# Patient Record
Sex: Female | Born: 1935 | Race: White | Hispanic: No | Marital: Married | Smoking: Never smoker
Health system: Southern US, Community
[De-identification: ages and names within clinical notes are randomized; demographics above are authoritative.]

## PROBLEM LIST (undated history)

## (undated) DIAGNOSIS — I10 Essential (primary) hypertension: Secondary | ICD-10-CM

## (undated) DIAGNOSIS — C801 Malignant (primary) neoplasm, unspecified: Secondary | ICD-10-CM

## (undated) DIAGNOSIS — I639 Cerebral infarction, unspecified: Secondary | ICD-10-CM

## (undated) DIAGNOSIS — Z4659 Encounter for fitting and adjustment of other gastrointestinal appliance and device: Secondary | ICD-10-CM

## (undated) DIAGNOSIS — I2510 Atherosclerotic heart disease of native coronary artery without angina pectoris: Secondary | ICD-10-CM

## (undated) DIAGNOSIS — D649 Anemia, unspecified: Secondary | ICD-10-CM

## (undated) DIAGNOSIS — F329 Major depressive disorder, single episode, unspecified: Secondary | ICD-10-CM

## (undated) DIAGNOSIS — E119 Type 2 diabetes mellitus without complications: Secondary | ICD-10-CM

## (undated) DIAGNOSIS — F32A Depression, unspecified: Secondary | ICD-10-CM

## (undated) HISTORY — PX: EYE SURGERY: SHX253

## (undated) HISTORY — PX: COLON SURGERY: SHX602

## (undated) HISTORY — PX: CHOLECYSTECTOMY: SHX55

## (undated) HISTORY — PX: HERNIA REPAIR: SHX51

---

## 2010-04-08 ENCOUNTER — Ambulatory Visit: Payer: Self-pay | Admitting: Internal Medicine

## 2010-05-05 ENCOUNTER — Ambulatory Visit: Payer: Self-pay | Admitting: Internal Medicine

## 2010-07-28 ENCOUNTER — Ambulatory Visit: Payer: Self-pay | Admitting: Cardiology

## 2015-02-12 ENCOUNTER — Encounter: Payer: Self-pay | Admitting: *Deleted

## 2015-02-12 ENCOUNTER — Ambulatory Visit (INDEPENDENT_AMBULATORY_CARE_PROVIDER_SITE_OTHER): Payer: Medicare PPO

## 2015-02-12 ENCOUNTER — Ambulatory Visit
Admission: EM | Admit: 2015-02-12 | Discharge: 2015-02-12 | Disposition: A | Discharge status: Home / Self Care | Payer: Medicare PPO | Attending: Family Medicine | Admitting: Family Medicine

## 2015-02-12 DIAGNOSIS — S91331A Puncture wound without foreign body, right foot, initial encounter: Secondary | ICD-10-CM | POA: Diagnosis not present

## 2015-02-12 DIAGNOSIS — S9031XA Contusion of right foot, initial encounter: Secondary | ICD-10-CM | POA: Diagnosis not present

## 2015-02-12 HISTORY — DX: Malignant (primary) neoplasm, unspecified: C80.1

## 2015-02-12 HISTORY — DX: Anemia, unspecified: D64.9

## 2015-02-12 HISTORY — DX: Depression, unspecified: F32.A

## 2015-02-12 HISTORY — DX: Cerebral infarction, unspecified: I63.9

## 2015-02-12 HISTORY — DX: Atherosclerotic heart disease of native coronary artery without angina pectoris: I25.10

## 2015-02-12 HISTORY — DX: Essential (primary) hypertension: I10

## 2015-02-12 HISTORY — DX: Type 2 diabetes mellitus without complications: E11.9

## 2015-02-12 HISTORY — DX: Major depressive disorder, single episode, unspecified: F32.9

## 2015-02-12 MED ORDER — TETANUS-DIPHTH-ACELL PERTUSSIS 5-2.5-18.5 LF-MCG/0.5 IM SUSP
0.5000 mL | Freq: Once | INTRAMUSCULAR | Status: AC
  Administered 2015-02-12: 0.5 mL via INTRAMUSCULAR

## 2015-02-12 MED ORDER — CIPROFLOXACIN HCL 500 MG PO TABS: 500.0000 mg | ORAL_TABLET | Freq: Two times a day (BID) | ORAL | Status: DC

## 2015-02-12 NOTE — ED Provider Notes (Signed)
[facility]  ____________________________________________  Time seen: Approximately 3:11 PM  I have reviewed the triage vital signs and the nursing notes.   HISTORY  Chief Complaint Foot Injury  HPI Alisha Winters is a 82 y.o. female presents with family at bedside for the complete the right foot pain. Patient reports that yesterday evening she was walking outside in her bedroom slippers that had a rubber sole, and she stepped on a piece of wood that went through her vegan slipper and cut her foot. Patient denies anything staying in her foot. Patient reports that she did not fall. Denies head injury or loss of consciousness. Denies other pain or injury. Patient states that she has had continued pain at the injury site as well as to the lateral right foot since injury occurrence. States current pain is 6 out of 10 aching. Denies any redness or coloration changes. Patient reports that she has chronic diabetic neuropathy in bilateral feet but denies any acute sensation changes. Denies any numbness.  Denies head injury or loss of consciousness. Denies other extremity injury. Denies neck or back pain or injury. Denies chest pain, shortness of  breath, abdominal pain, weakness, dizziness.   Reports unsure of her last tetanus immunization. Reports that her grandson lives at her home and helps take care of her.  Past Medical History  Diagnosis Date  . Hypertension   . Diabetes mellitus without complication ([place])   . Depression   . Anemia   . Coronary artery disease   . Cancer ([place])   . Stroke [facility])     There are no active problems to display for this patient.   Past Surgical History  Procedure Laterality Date  . Eye surgery    . Colon surgery    . Hernia repair    . Cholecystectomy      Current Outpatient Rx  Name  Route  Sig  Dispense  Refill  . amLODipine (NORVASC) 10 MG tablet   Oral   Take 10 mg by mouth daily.         Marland Kitchen aspirin 81 MG tablet   Oral   Take 81  mg by mouth daily.         . insulin NPH-regular Human (NOVOLIN 70/30) (70-30) 100 UNIT/ML injection   Subcutaneous   Inject into the skin 2 (two) times daily with a meal.         . isosorbide mononitrate (IMDUR) 30 MG 24 hr tablet   Oral   Take 30 mg by mouth daily.         Marland Kitchen lisinopril (PRINIVIL,ZESTRIL) 10 MG tablet   Oral   Take 10 mg by mouth daily.         . metoprolol succinate (TOPROL-XL) 50 MG 24 hr tablet   Oral   Take 50 mg by mouth daily. Take with or immediately following a meal.         . Omega-3 Fatty Acids (FISH OIL PO)   Oral   Take 1 capsule by mouth daily.         . Probiotic Product (ALIGN) 4 MG CAPS   Oral   Take 1 capsule by mouth daily.         . psyllium (METAMUCIL) 58.6 % packet   Oral   Take 1 packet by mouth daily.         . vitamin B-12 (CYANOCOBALAMIN) 1000 MCG tablet   Oral   Take 1,000 mcg by mouth daily.  Allergies Penicillins; Atorvastatin; Citalopram; Codeine; Meloxicam; and Simvastatin  History reviewed. No pertinent family history.  Social History Social History  Substance Use Topics  . Smoking status: Never Smoker   . Smokeless tobacco: Never Used  . Alcohol Use: No    Review of Systems Constitutional: No fever/chills Eyes: No visual changes. ENT: No sore throat. Cardiovascular: Denies chest pain. Respiratory: Denies shortness of breath. Gastrointestinal: No abdominal pain.  No nausea, no vomiting.  No diarrhea.  No constipation. Genitourinary: Negative for dysuria. Musculoskeletal: Negative for back pain. right foot pain.  Skin: Negative for rash. Neurological: Negative for headaches, focal weakness or numbness.  10-point ROS otherwise negative.  ____________________________________________   PHYSICAL EXAM:  VITAL SIGNS: ED Triage Vitals  Enc Vitals Group     BP 02/12/15 1349 159/65 mmHg     Pulse Rate 02/12/15 1349 86     Resp 02/12/15 1349 18     Temp 02/12/15 1349 97.4 F  (36.3 C)     Temp Source 02/12/15 1349 Tympanic     SpO2 02/12/15 1349 97 %     Weight 02/12/15 1349 280 lb (127.007 kg)     Height 02/12/15 1349 5\' 10"  (1.778 m)     Head Cir --      Peak Flow --      Pain Score 02/12/15 1406 10     Pain Loc --      Pain Edu? --      Excl. in [place]? --     Constitutional: Alert and oriented. Well appearing and in no acute distress. Eyes: Conjunctivae are normal. PERRL. EOMI. Head: Atraumatic.  Nose: No congestion/rhinnorhea.  Mouth/Throat: Mucous membranes are moist.   Neck: No stridor.  No cervical spine tenderness to palpation. Hematological/Lymphatic/Immunilogical: No cervical lymphadenopathy. Cardiovascular: Normal rate, regular rhythm. Grossly normal heart sounds.  Good peripheral circulation. Respiratory: Normal respiratory effort.  No retractions. Lungs CTAB. Gastrointestinal: Soft and nontender. No distention. Normal Bowel sounds.  No abdominal bruits. No CVA tenderness. Musculoskeletal: No lower or upper extremity tenderness nor edema. Bilateral pedal pulses equal and easily palpated.  no cervical, thoracic or lumbar tenderness to palpation.  Except: Right lateral plantar foot abrasion present times approximately 0.50.5 cm, no penetration marks noted, mild tenderness palpation, no foreign body visualized, no erythema, no exudate or drainage, no induration, right lateral and right lateral plantar foot mild to moderate tenderness to palpation with mild swelling, right lateral foot with mild ecchymosis, full range of motion to the right foot, mild pain with right dorsiflexion and plantar flexion, cap refill less than 2 seconds to all right foot toes, sensation intact to right foot, bilateral pedal pulses equal bilaterally, right foot and right lower actually otherwise nontender.Ambulatory with mild antalgic gait.  Neurologic:  Normal speech and language. No gross focal neurologic deficits are appreciated.  Skin:  Skin is warm, dry and intact. No rash  noted. Psychiatric: Mood and affect are normal. Speech and behavior are normal.  ____________________________________________   LABS (all labs ordered are listed, but only abnormal results are displayed)  Labs Reviewed - No data to display ____________________________________________  RADIOLOGY  EXAM: RIGHT FOOT COMPLETE - 3+ VIEW  COMPARISON: None.  FINDINGS: There is no evidence of fracture or dislocation. There is no evidence of other focal bone abnormality. There is palmar midfoot soft tissue swelling. Vascular calcifications are seen. No radiopaque foreign body are noted.  IMPRESSION: No acute fracture or dislocation identified about the right foot.  No evidence of radiopaque foreign  bodies.   Electronically Signed By: Fidela Salisbury M.D. On: 02/12/2015 14:53  I, Marylene Land, personally viewed and evaluated these images (plain radiographs) as part of my medical decision making, as well as reviewing the written report by the radiologist.  ____________________________________________   PROCEDURES  Procedure(s) performed:  Right postoperative shoe applied by RN. Neurovascular intact post application.  Right foot wound cleaned and irrigated with betadine and saline, no repair indicated. No foreign bodies visualized.  ____________________________________________   INITIAL IMPRESSION / ASSESSMENT AND PLAN / ED COURSE  Pertinent labs & imaging results that were available during my care of the patient were reviewed by me and considered in my medical decision making (see chart for details).  Very well-appearing patient. No acute distress. Presents with family at bedside. Patient reports that yesterday evening she was walking outside her bedroom slippers and stepped on a piece of wood that cut her foot through her slip her. Denies fall or other injury. Reports pain since to her right foot. Right plantar foot with tenderness and superficial abrasion  present, no foreign bodies palpated or visualized. Will evaluate x-ray.  Right foot x-ray no acute fracture or dislocation identified about the right foot, no evidence of radiopaque foreign bodies, per radiologist. Soaked and cleaned foot with betadine. Topical antibiotic and dressing applied. Tetanus immunization updated. States she believes she has a pronged walker at home, prescription given for walker in case she doesn't have one. As report that injury was through rubber shoe, will place on cipro orally. Encouraged rest, ice elevation, cleaning and topical antibiotics. Encouraged close wound monitoring, and discussed this with patient, grandson and daughter.   Discussed follow up with Primary care physician this week. Discussed follow up and return parameters including no resolution or any worsening concerns. Patient verbalized understanding and agreed to plan.   ____________________________________________   FINAL CLINICAL IMPRESSION(S) / ED DIAGNOSES  Final diagnoses:  Foot contusion, right, initial encounter  Puncture wound of plantar aspect of foot without complication, right, initial encounter      Note: This dictation was prepared with Dragon dictation along with smaller phrase technology. Any transcriptional errors that result from this process are unintentional.    Marylene Land, NP 02/12/15 [phone]

## 2015-02-12 NOTE — Discharge Instructions (Signed)
Take medication as prescribed. Keep clean. Apply topical antibiotic. Use walker and post operative shoe as long as pain continues, elevate foot. Monitor wound closely.   Follow up with your primary care physician this week. Return to Urgent care or ER for redness, swelling, increased pain, drainage, new or worsening concerns.    Foot Contusion A foot contusion is a deep bruise to the foot. Contusions are the result of an injury that caused bleeding under the skin. The contusion may turn blue, purple, or yellow. Minor injuries will give you a painless contusion, but more severe contusions may stay painful and swollen for a few weeks. CAUSES  A foot contusion comes from a direct blow to that area, such as a heavy object falling on the foot. SYMPTOMS   Swelling of the foot.  Discoloration of the foot.  Tenderness or soreness of the foot. DIAGNOSIS  You will have a physical exam and will be asked about your history. You may need an X-ray of your foot to look for a broken bone (fracture).  TREATMENT  An elastic wrap may be recommended to support your foot. Resting, elevating, and applying cold compresses to your foot are often the best treatments for a foot contusion. Over-the-counter medicines may also be recommended for pain control. HOME CARE INSTRUCTIONS   Put ice on the injured area.  Put ice in a plastic bag.  Place a towel between your skin and the bag.  Leave the ice on for 15-20 minutes, 03-04 times a day.  Only take over-the-counter or prescription medicines for pain, discomfort, or fever as directed by your caregiver.  If told, use an elastic wrap as directed. This can help reduce swelling. You may remove the wrap for sleeping, showering, and bathing. If your toes become numb, cold, or blue, take the wrap off and reapply it more loosely.  Elevate your foot with pillows to reduce swelling.  Try to avoid standing or walking while the foot is painful. Do not resume use until  instructed by your caregiver. Then, begin use gradually. If pain develops, decrease use. Gradually increase activities that do not cause discomfort until you have normal use of your foot.  See your caregiver as directed. It is very important to keep all follow-up appointments in order to avoid any lasting problems with your foot, including long-term (chronic) pain. SEEK IMMEDIATE MEDICAL CARE IF:   You have increased redness, swelling, or pain in your foot.  Your swelling or pain is not relieved with medicines.  You have loss of feeling in your foot or are unable to move your toes.  Your foot turns cold or blue.  You have pain when you move your toes.  Your foot becomes warm to the touch.  Your contusion does not improve in 2 days. MAKE SURE YOU:   Understand these instructions.  Will watch your condition.  Will get help right away if you are not doing well or get worse.   This information is not intended to replace advice given to you by your health care provider. Make sure you discuss any questions you have with your health care provider.   Document Released: 10/12/2005 Document Revised: 06/22/2011 Document Reviewed: 08/27/2014 Elsevier Interactive Patient Education 2016 [employer]  Puncture Wound A puncture wound is an injury that is caused by a sharp, thin object that goes through (penetrates) your skin. Usually, a puncture wound does not leave a large opening in your skin, so it may not bleed a lot. However,  when you get a puncture wound, dirt or other materials (foreign bodies) can be forced into your wound and break off inside. This increases the chance of infection, such as tetanus. CAUSES Puncture wounds are caused by any sharp, thin object that goes through your skin, such as:  Animal teeth, as with an animal bite.  Sharp, pointed objects, such as nails, splinters of glass, fishhooks, and needles. SYMPTOMS Symptoms of a puncture wound  include:  Pain.  Bleeding.  Swelling.  Bruising.  Fluid leaking from the wound.  Numbness, tingling, or loss of function. DIAGNOSIS This condition is diagnosed with a medical history and physical exam. Your wound will be checked to see if it contains any foreign bodies. You may also have X-rays or other imaging tests. TREATMENT Treatment for a puncture wound depends on how serious the wound is. It also depends on whether the wound contains any foreign bodies. Treatment for all types of puncture wounds usually starts with:  Controlling the bleeding.  Washing out the wound with a germ-free (sterile) salt-water solution.  Checking the wound for foreign bodies. Treatment may also include:  Having the wound opened surgically to remove a foreign object.  Closing the wound with stitches (sutures) if it continues to bleed.  Covering the wound with antibiotic ointments and a bandage (dressing).  Receiving a tetanus shot.  Receiving a rabies vaccine. HOME CARE INSTRUCTIONS Medicines  Take or apply over-the-counter and prescription medicines only as told by your health care provider.  If you were prescribed an antibiotic, take or apply it as told by your health care provider. Do not stop using the antibiotic even if your condition improves. Wound Care  There are many ways to close and cover a wound. For example, a wound can be covered with sutures, skin glue, or adhesive strips. Follow instructions from your health care provider about:  How to take care of your wound.  When and how you should change your dressing.  When you should remove your dressing.  Removing whatever was used to close your wound.  Keep the dressing dry as told by your health care provider. Do not take baths, swim, use a hot tub, or do anything that would put your wound underwater until your health care provider approves.  Clean the wound as told by your health care provider.  Do not scratch or pick  at the wound.  Check your wound every day for signs of infection. Watch for:  Redness, swelling, or pain.  Fluid, blood, or pus. General Instructions  Raise (elevate) the injured area above the level of your heart while you are sitting or lying down.  If your puncture wound is in your foot, ask your health care provider if you need to avoid putting weight on your foot and for how long.  Keep all follow-up visits as told by your health care provider. This is important. SEEK MEDICAL CARE IF:  You received a tetanus shot and you have swelling, severe pain, redness, or bleeding at the injection site.  You have a fever.  Your sutures come out.  You notice a bad smell coming from your wound or your dressing.  You notice something coming out of your wound, such as wood or glass.  Your pain is not controlled with medicine.  You have increased redness, swelling, or pain at the site of your wound.  You have fluid, blood, or pus coming from your wound.  You notice a change in the color of your skin  near your wound.  You need to change the dressing frequently due to fluid, blood, or pus draining from your wound.  You develop a new rash.  You develop numbness around your wound. SEEK IMMEDIATE MEDICAL CARE IF:  You develop severe swelling around your wound.  Your pain suddenly increases and is severe.  You develop painful skin lumps.  You have a red streak going away from your wound.  The wound is on your hand or foot and you cannot properly move a finger or toe.  The wound is on your hand or foot and you notice that your fingers or toes look pale or bluish.   This information is not intended to replace advice given to you by your health care provider. Make sure you discuss any questions you have with your health care provider.   Document Released: 09/30/2004 Document Revised: 09/11/2014 Document Reviewed: 02/13/2014 Elsevier Interactive Patient Education [employer].

## 2015-02-12 NOTE — ED Notes (Signed)
Patient was working in yard when she stepped on something sharp which went through her right show and cut her foot.

## 2019-04-12 ENCOUNTER — Ambulatory Visit: Payer: Medicare Other | Attending: Internal Medicine

## 2019-04-12 DIAGNOSIS — Z23 Encounter for immunization: Secondary | ICD-10-CM

## 2019-04-12 NOTE — Progress Notes (Signed)
   Covid-19 Vaccination Clinic  Name:  Alisha Winters    MRN: [id number] DOB: October 09, 1932  04/12/2019  [name] was observed post Covid-19 immunization for 15 minutes without incident. She was provided with Vaccine Information Sheet and instruction to access the V-Safe system.   [name] was instructed to call 911 with any severe reactions post vaccine: Marland Kitchen Difficulty breathing  . Swelling of face and throat  . A fast heartbeat  . A bad rash all over body  . Dizziness and weakness   Immunizations Administered    Name Date Dose VIS Date Route   Pfizer COVID-19 Vaccine 04/12/2019 10:57 AM 0.3 mL 12/15/2018 Intramuscular   Manufacturer: [place]   Lot: [id number]   [place]: [id number]

## 2019-05-08 ENCOUNTER — Ambulatory Visit: Payer: Medicare Other | Attending: Internal Medicine

## 2019-05-08 DIAGNOSIS — Z23 Encounter for immunization: Secondary | ICD-10-CM

## 2019-05-08 NOTE — Progress Notes (Signed)
   Covid-19 Vaccination Clinic  Name:  Alisha Winters    MRN: [id number] DOB: 10-06-1932  05/08/2019  [name] was observed post Covid-19 immunization for 15 minutes without incident. She was provided with Vaccine Information Sheet and instruction to access the V-Safe system.   [name] was instructed to call 911 with any severe reactions post vaccine: Marland Kitchen Difficulty breathing  . Swelling of face and throat  . A fast heartbeat  . A bad rash all over body  . Dizziness and weakness   Immunizations Administered    Name Date Dose VIS Date Route   Pfizer COVID-19 Vaccine 05/08/2019 11:06 AM 0.3 mL 02/28/2018 Intramuscular   Manufacturer: [place]   Lot: [id number]   [place]: [id number]

## 2019-07-14 ENCOUNTER — Other Ambulatory Visit: Payer: Self-pay

## 2019-07-14 ENCOUNTER — Emergency Department: Payer: Medicare Other

## 2019-07-14 ENCOUNTER — Inpatient Hospital Stay
Admission: EM | Admit: 2019-07-14 | Discharge: 2019-07-19 | DRG: 482 | Disposition: A | Discharge status: Skilled Nursing Facility | Payer: Medicare Other | Attending: Internal Medicine | Admitting: Internal Medicine

## 2019-07-14 DIAGNOSIS — Z8673 Personal history of transient ischemic attack (TIA), and cerebral infarction without residual deficits: Secondary | ICD-10-CM

## 2019-07-14 DIAGNOSIS — Z888 Allergy status to other drugs, medicaments and biological substances status: Secondary | ICD-10-CM

## 2019-07-14 DIAGNOSIS — Z7901 Long term (current) use of anticoagulants: Secondary | ICD-10-CM

## 2019-07-14 DIAGNOSIS — R42 Dizziness and giddiness: Secondary | ICD-10-CM

## 2019-07-14 DIAGNOSIS — Z79899 Other long term (current) drug therapy: Secondary | ICD-10-CM

## 2019-07-14 DIAGNOSIS — E119 Type 2 diabetes mellitus without complications: Secondary | ICD-10-CM | POA: Diagnosis present

## 2019-07-14 DIAGNOSIS — S72001A Fracture of unspecified part of neck of right femur, initial encounter for closed fracture: Secondary | ICD-10-CM

## 2019-07-14 DIAGNOSIS — E785 Hyperlipidemia, unspecified: Secondary | ICD-10-CM | POA: Diagnosis present

## 2019-07-14 DIAGNOSIS — Z7982 Long term (current) use of aspirin: Secondary | ICD-10-CM

## 2019-07-14 DIAGNOSIS — I2510 Atherosclerotic heart disease of native coronary artery without angina pectoris: Secondary | ICD-10-CM | POA: Diagnosis present

## 2019-07-14 DIAGNOSIS — Z881 Allergy status to other antibiotic agents status: Secondary | ICD-10-CM

## 2019-07-14 DIAGNOSIS — S72141A Displaced intertrochanteric fracture of right femur, initial encounter for closed fracture: Secondary | ICD-10-CM | POA: Diagnosis present

## 2019-07-14 DIAGNOSIS — M80051A Age-related osteoporosis with current pathological fracture, right femur, initial encounter for fracture: Principal | ICD-10-CM | POA: Diagnosis present

## 2019-07-14 DIAGNOSIS — Z885 Allergy status to narcotic agent status: Secondary | ICD-10-CM

## 2019-07-14 DIAGNOSIS — I480 Paroxysmal atrial fibrillation: Secondary | ICD-10-CM | POA: Diagnosis present

## 2019-07-14 DIAGNOSIS — Z20822 Contact with and (suspected) exposure to covid-19: Secondary | ICD-10-CM | POA: Diagnosis present

## 2019-07-14 DIAGNOSIS — W1830XA Fall on same level, unspecified, initial encounter: Secondary | ICD-10-CM | POA: Diagnosis present

## 2019-07-14 DIAGNOSIS — Z880 Allergy status to penicillin: Secondary | ICD-10-CM

## 2019-07-14 DIAGNOSIS — Z794 Long term (current) use of insulin: Secondary | ICD-10-CM

## 2019-07-14 DIAGNOSIS — I10 Essential (primary) hypertension: Secondary | ICD-10-CM | POA: Diagnosis present

## 2019-07-14 DIAGNOSIS — Z09 Encounter for follow-up examination after completed treatment for conditions other than malignant neoplasm: Secondary | ICD-10-CM

## 2019-07-14 LAB — BASIC METABOLIC PANEL
Anion gap: 9 (ref 5–15)
BUN: 22 mg/dL (ref 8–23)
CO2: 24 mmol/L (ref 22–32)
Calcium: 8.3 mg/dL — ABNORMAL LOW (ref 8.9–10.3)
Chloride: 102 mmol/L (ref 98–111)
Creatinine, Ser: 0.89 mg/dL (ref 0.44–1.00)
GFR calc Af Amer: >60 mL/min (ref >60)
GFR calc non Af Amer: 59 mL/min — ABNORMAL LOW (ref >60)
Glucose, Bld: 258 mg/dL — ABNORMAL HIGH (ref 70–99)
Potassium: 4 mmol/L (ref 3.5–5.1)
Sodium: 135 mmol/L (ref 135–145)

## 2019-07-14 LAB — CBC WITH DIFFERENTIAL/PLATELET
Abs Immature Granulocytes: 0.07 10*3/uL (ref 0.00–0.07)
Basophils Absolute: 0.1 10*3/uL (ref 0.0–0.1)
Basophils Relative: 1 %
Eosinophils Absolute: 0.1 10*3/uL (ref 0.0–0.5)
Eosinophils Relative: 1 %
HCT: 39.9 % (ref 36.0–46.0)
Hemoglobin: 12.9 g/dL (ref 12.0–15.0)
Immature Granulocytes: 1 %
Lymphocytes Relative: 13 %
Lymphs Abs: 1.2 10*3/uL (ref 0.7–4.0)
MCH: 26.7 pg (ref 26.0–34.0)
MCHC: 32.3 g/dL (ref 30.0–36.0)
MCV: 82.4 fL (ref 80.0–100.0)
Monocytes Absolute: 0.6 10*3/uL (ref 0.1–1.0)
Monocytes Relative: 6 %
Neutro Abs: 7.3 10*3/uL (ref 1.7–7.7)
Neutrophils Relative %: 78 %
Platelets: 252 10*3/uL (ref 150–400)
RBC: 4.84 MIL/uL (ref 3.87–5.11)
RDW: 16 % — ABNORMAL HIGH (ref 11.5–15.5)
WBC: 9.4 10*3/uL (ref 4.0–10.5)
nRBC: 0 % (ref 0.0–0.2)

## 2019-07-14 LAB — PROTIME-INR
INR: 2.4 — ABNORMAL HIGH (ref 0.8–1.2)
Prothrombin Time: 25.5 seconds — ABNORMAL HIGH (ref 11.4–15.2)

## 2019-07-14 LAB — TROPONIN I (HIGH SENSITIVITY): Troponin I (High Sensitivity): 11 ng/L (ref <18)

## 2019-07-14 MED ORDER — VITAMIN K1 10 MG/ML IJ SOLN
10.0000 mg | Freq: Once | INTRAVENOUS | Status: AC
  Administered 2019-07-15: 10 mg via INTRAVENOUS
  Filled 2019-07-14: qty 1

## 2019-07-14 MED ORDER — CEFAZOLIN SODIUM-DEXTROSE 2-4 GM/100ML-% IV SOLN
2.0000 g | INTRAVENOUS | Status: AC
Start: 2019-07-15 — End: 2019-07-16
  Filled 2019-07-14: qty 100

## 2019-07-14 MED ORDER — CHLORHEXIDINE GLUCONATE 4 % EX LIQD: 1.0000 "application " | Freq: Once | CUTANEOUS | Status: DC

## 2019-07-14 MED ORDER — CLINDAMYCIN PHOSPHATE 600 MG/50ML IV SOLN
600.0000 mg | INTRAVENOUS | Status: AC
Start: 2019-07-15 — End: 2019-07-16
  Filled 2019-07-14: qty 50

## 2019-07-14 MED ORDER — ONDANSETRON HCL 4 MG/2ML IJ SOLN
4.0000 mg | Freq: Once | INTRAMUSCULAR | Status: AC
Start: 2019-07-14 — End: 2019-07-14

## 2019-07-14 MED ORDER — MORPHINE SULFATE (PF) 2 MG/ML IV SOLN
1.0000 mg | INTRAVENOUS | Status: DC | PRN
  Administered 2019-07-15 (×2): 1 mg via INTRAVENOUS
  Filled 2019-07-14: qty 1

## 2019-07-14 MED ORDER — SODIUM CHLORIDE 0.9 % IV BOLUS
500.0000 mL | Freq: Once | INTRAVENOUS | Status: AC
  Administered 2019-07-14: 500 mL via INTRAVENOUS

## 2019-07-14 MED ORDER — ONDANSETRON HCL 4 MG/2ML IJ SOLN
INTRAMUSCULAR | Status: AC
  Administered 2019-07-14: 4 mg via INTRAVENOUS
  Filled 2019-07-14: qty 2

## 2019-07-14 MED ORDER — SODIUM CHLORIDE 0.9 % IV SOLN: INTRAVENOUS | Status: DC

## 2019-07-14 MED ORDER — MORPHINE SULFATE (PF) 4 MG/ML IV SOLN
4.0000 mg | Freq: Once | INTRAVENOUS | Status: AC
  Administered 2019-07-14: 4 mg via INTRAVENOUS
  Filled 2019-07-14: qty 1

## 2019-07-14 NOTE — ED Provider Notes (Signed)
[facility] Emergency Department Provider Note  ____________________________________________  Time seen: Approximately 10:57 PM  I have reviewed the triage vital signs and the nursing notes.   HISTORY  Chief Complaint Fall    HPI CALIYAH SIEH is a 86 y.o. female with a history of hypertension diabetes CAD and vertigo who comes ED complaining of right hip pain after a fall precipitated by vertigo today.  Reportedly she also hit her head but denies loss of consciousness or neck pain.  She is on Coumadin.  Pain in the right hip was sudden onset, severe, nonradiating, worse with movement.  Unable to stand or bear weight.  Denies vision changes paresthesias or lateralizing weakness.      Past Medical History:  Diagnosis Date  . Anemia   . Cancer ([place])   . Coronary artery disease   . Depression   . Diabetes mellitus without complication ([place])   . Hypertension   . Stroke [facility])      There are no problems to display for this patient.    Past Surgical History:  Procedure Laterality Date  . CHOLECYSTECTOMY    . COLON SURGERY    . EYE SURGERY    . HERNIA REPAIR       Prior to Admission medications   Medication Sig Start Date End Date Taking? Authorizing Provider  amLODipine (NORVASC) 10 MG tablet Take 10 mg by mouth daily.    [provider]  aspirin 81 MG tablet Take 81 mg by mouth daily.    [provider]  ciprofloxacin (CIPRO) 500 MG tablet Take 1 tablet (500 mg total) by mouth 2 (two) times daily. 02/12/15   Marylene Land, NP  insulin NPH-regular Human (NOVOLIN 70/30) (70-30) 100 UNIT/ML injection Inject into the skin 2 (two) times daily with a meal.    [provider]  isosorbide mononitrate (IMDUR) 30 MG 24 hr tablet Take 30 mg by mouth daily.    [provider]  lisinopril (PRINIVIL,ZESTRIL) 10 MG tablet Take 10 mg by mouth daily.    [provider]  metoprolol succinate (TOPROL-XL) 50 MG 24 hr  tablet Take 50 mg by mouth daily. Take with or immediately following a meal.    [provider]  Omega-3 Fatty Acids (FISH OIL PO) Take 1 capsule by mouth daily.    [provider]  Probiotic Product (ALIGN) 4 MG CAPS Take 1 capsule by mouth daily.    [provider]  psyllium (METAMUCIL) 58.6 % packet Take 1 packet by mouth daily.    [provider]  vitamin B-12 (CYANOCOBALAMIN) 1000 MCG tablet Take 1,000 mcg by mouth daily.    [provider]     Allergies Penicillins, Atorvastatin, Citalopram, Codeine, Meloxicam, and Simvastatin   No family history on file.  Social History Social History   Tobacco Use  . Smoking status: Never Smoker  . Smokeless tobacco: Never Used  Substance Use Topics  . Alcohol use: No  . Drug use: No    Review of Systems  Constitutional:   No fever or chills.  ENT:   No sore throat. No rhinorrhea. Cardiovascular:   No chest pain or syncope. Respiratory:   No dyspnea or cough. Gastrointestinal:   Negative for abdominal pain, vomiting and diarrhea.  Musculoskeletal:   Right hip pain as above All other systems reviewed and are negative except as documented above in ROS and HPI.  ____________________________________________   PHYSICAL EXAM:  VITAL SIGNS: ED Triage Vitals  Enc Vitals Group     BP 07/14/19 2221 (!) 173/74     Pulse Rate 07/14/19 2221 71     Resp 07/14/19 2221 19     Temp 07/14/19 2221 98.1 F (36.7 C)     Temp Source 07/14/19 2221 Oral     SpO2 07/14/19 2221 96 %     Weight 07/14/19 2219 260 lb (117.9 kg)     Height 07/14/19 2219 6' (1.829 m)     Head Circumference --      Peak Flow --      Pain Score 07/14/19 2219 4     Pain Loc --      Pain Edu? --      Excl. in [place]? --     Vital signs reviewed, nursing assessments reviewed.   Constitutional:   Alert and oriented. Non-toxic appearance. Eyes:   Conjunctivae are normal. EOMI. PERRL. ENT      Head:   Normocephalic and  atraumatic.      Nose: Normal.      Mouth/Throat: No intraoral injury.      Neck:   No meningismus. Full ROM.  No midline tenderness. Hematological/Lymphatic/Immunilogical:   No cervical lymphadenopathy. Cardiovascular:   RRR. Symmetric bilateral radial and DP pulses.  No murmurs. Cap refill less than 2 seconds. Respiratory:   Normal respiratory effort without tachypnea/retractions. Breath sounds are clear and equal bilaterally. No wheezes/rales/rhonchi. Gastrointestinal:   Soft and nontender. Non distended. There is no CVA tenderness.  No rebound, rigidity, or guarding.  Musculoskeletal:   Tenderness at the right hip with limb shortening and external rotation.  Distal femur  Neurologic:   Normal speech and language.  Motor grossly intact. No acute focal neurologic deficits are appreciated.  Skin:    Skin is warm, dry and intact. No rash noted.  No petechiae, purpura, or bullae.  ____________________________________________    LABS (pertinent positives/negatives) (all labs ordered are listed, but only abnormal results are displayed) Labs Reviewed  SARS CORONAVIRUS 2 BY RT PCR ([facility])  BASIC METABOLIC PANEL  CBC WITH DIFFERENTIAL/PLATELET  PROTIME-INR  TROPONIN I (HIGH SENSITIVITY)   ____________________________________________   EKG  Interpreted by me Atrial fibrillation rate of 68, normal axis and intervals.  Normal QRS ST segments and T waves.  ____________________________________________    RADIOLOGY  DG Chest 1 View  Result Date: 07/14/2019 CLINICAL DATA:  Fall with right hip pain EXAM: CHEST  1 VIEW COMPARISON:  None. FINDINGS: Right hemidiaphragm is elevated. There is bibasilar atelectasis. No focal consolidation pulmonary edema. Normal cardiomediastinal contours. No pleural effusion. IMPRESSION: Right hemidiaphragm elevation with bibasilar atelectasis. Electronically Signed   By: Ulyses Jarred M.D.   On: 07/14/2019  22:51   CT HEAD WO CONTRAST  Result Date: 07/14/2019 CLINICAL DATA:  Fall EXAM: CT HEAD WITHOUT CONTRAST TECHNIQUE: Contiguous axial images were obtained from the base of the skull through the vertex without intravenous contrast. COMPARISON:  None. FINDINGS: Brain: There is no mass, hemorrhage or extra-axial collection. The size and configuration of the ventricles and extra-axial CSF spaces are normal. The brain parenchyma is normal, without acute or chronic infarction. Vascular: No abnormal hyperdensity of the major intracranial arteries or dural venous sinuses. No intracranial atherosclerosis. Skull: The visualized skull base, calvarium and extracranial soft tissues are normal. Sinuses/Orbits: No fluid levels or advanced mucosal thickening of the visualized paranasal sinuses. No mastoid or middle ear effusion. The orbits are normal. IMPRESSION: Normal head CT. Electronically Signed  By: Ulyses Jarred M.D.   On: 07/14/2019 22:56   DG Hip Unilat W or Wo Pelvis 2-3 Views Right  Result Date: 07/14/2019 CLINICAL DATA:  [place], right hip pain EXAM: DG HIP (WITH OR WITHOUT PELVIS) 2-3V RIGHT COMPARISON:  None. FINDINGS: Frontal view of the pelvis as well as frontal and cross-table lateral views of the right hip are obtained. There is an impacted basicervical right hip fracture with varus angulation. No dislocation. The remainder of the bony pelvis is unremarkable. Sacroiliac joints are normal. IMPRESSION: 1. Impacted basicervical right hip fracture with varus angulation. Electronically Signed   By: Randa Ngo M.D.   On: 07/14/2019 22:52    ____________________________________________   PROCEDURES Procedures  ____________________________________________  DIFFERENTIAL DIAGNOSIS   Right hip fracture, intracranial hemorrhage, dehydration, electrolyte abnormality  CLINICAL IMPRESSION / ASSESSMENT AND PLAN / ED COURSE  Medications ordered in the ED: Medications  sodium chloride 0.9 % bolus 500 mL  (has no administration in time range)  morphine 4 MG/ML injection 4 mg (has no administration in time range)  ondansetron (ZOFRAN) injection 4 mg (has no administration in time range)    Pertinent labs & imaging results that were available during my care of the patient were reviewed by me and considered in my medical decision making (see chart for details).  WAUNETTA RIGGLE was evaluated in Emergency Department on 07/14/2019 for the symptoms described in the history of present illness. She was evaluated in the context of the global COVID-19 pandemic, which necessitated consideration that the patient might be at risk for infection with the SARS-CoV-2 virus that causes COVID-19. Institutional protocols and algorithms that pertain to the evaluation of patients at risk for COVID-19 are in a state of rapid change based on information released by regulatory bodies including the CDC and federal and state organizations. These policies and algorithms were followed during the patient's care in the ED.   Patient presents with right hip pain after a fall, clinically apparent hip fracture.  Will obtain x-ray of right hip and chest.  CT head to evaluate for possible intracranial hemorrhage due to anticoagulation and head trauma.  Check labs, Covid screening.  ----------------------------------------- 11:06 PM on 07/14/2019 -----------------------------------------  X-ray confirms right femoral neck/intertrochanteric fracture.  Discussed with Dr. Sabra Heck who will plan surgical repair depending on anticoagulation status when labs are available.  Will discuss with hospitalist.      ____________________________________________   FINAL CLINICAL IMPRESSION(S) / ED DIAGNOSES    Final diagnoses:  Closed right hip fracture, initial encounter ([place])  Vertigo  Type 2 diabetes mellitus without complication, with long-term current use of insulin [facility])     ED Discharge Orders    None      Portions of this  note were generated with dragon dictation software. Dictation errors may occur despite best attempts at proofreading.   Carrie Mew, MD 07/14/19 2306

## 2019-07-14 NOTE — ED Triage Notes (Signed)
Pt from home via  [facility] w cc of fall. Pt has hx of vertigo, was trying ot go to bathroom and fell. Denies LOC, pt states "they told me I hit my head". Reports R hip pain and obvious R leg shortening. 20G L wrist, pt given 132mcg fentanyl total by EMS Pt 91% room air, placed on 3L [place] and improved to 96'%

## 2019-07-15 ENCOUNTER — Inpatient Hospital Stay
Admit: 2019-07-15 | Discharge: 2019-07-15 | Disposition: A | Discharge status: Home / Self Care | Payer: Medicare Other | Attending: Family Medicine | Admitting: Family Medicine

## 2019-07-15 DIAGNOSIS — Z7982 Long term (current) use of aspirin: Secondary | ICD-10-CM | POA: Diagnosis not present

## 2019-07-15 DIAGNOSIS — I4891 Unspecified atrial fibrillation: Secondary | ICD-10-CM | POA: Diagnosis not present

## 2019-07-15 DIAGNOSIS — Z880 Allergy status to penicillin: Secondary | ICD-10-CM | POA: Diagnosis not present

## 2019-07-15 DIAGNOSIS — Z8673 Personal history of transient ischemic attack (TIA), and cerebral infarction without residual deficits: Secondary | ICD-10-CM | POA: Diagnosis not present

## 2019-07-15 DIAGNOSIS — I10 Essential (primary) hypertension: Secondary | ICD-10-CM | POA: Diagnosis present

## 2019-07-15 DIAGNOSIS — Z79899 Other long term (current) drug therapy: Secondary | ICD-10-CM | POA: Diagnosis not present

## 2019-07-15 DIAGNOSIS — E119 Type 2 diabetes mellitus without complications: Secondary | ICD-10-CM | POA: Diagnosis present

## 2019-07-15 DIAGNOSIS — E118 Type 2 diabetes mellitus with unspecified complications: Secondary | ICD-10-CM | POA: Diagnosis not present

## 2019-07-15 DIAGNOSIS — E785 Hyperlipidemia, unspecified: Secondary | ICD-10-CM | POA: Diagnosis present

## 2019-07-15 DIAGNOSIS — Z794 Long term (current) use of insulin: Secondary | ICD-10-CM | POA: Diagnosis not present

## 2019-07-15 DIAGNOSIS — R42 Dizziness and giddiness: Secondary | ICD-10-CM | POA: Diagnosis present

## 2019-07-15 DIAGNOSIS — M80051A Age-related osteoporosis with current pathological fracture, right femur, initial encounter for fracture: Secondary | ICD-10-CM | POA: Diagnosis present

## 2019-07-15 DIAGNOSIS — I2510 Atherosclerotic heart disease of native coronary artery without angina pectoris: Secondary | ICD-10-CM | POA: Diagnosis present

## 2019-07-15 DIAGNOSIS — Z20822 Contact with and (suspected) exposure to covid-19: Secondary | ICD-10-CM | POA: Diagnosis present

## 2019-07-15 DIAGNOSIS — Z881 Allergy status to other antibiotic agents status: Secondary | ICD-10-CM | POA: Diagnosis not present

## 2019-07-15 DIAGNOSIS — S72141A Displaced intertrochanteric fracture of right femur, initial encounter for closed fracture: Secondary | ICD-10-CM | POA: Diagnosis not present

## 2019-07-15 DIAGNOSIS — W1830XA Fall on same level, unspecified, initial encounter: Secondary | ICD-10-CM | POA: Diagnosis present

## 2019-07-15 DIAGNOSIS — I480 Paroxysmal atrial fibrillation: Secondary | ICD-10-CM | POA: Diagnosis present

## 2019-07-15 DIAGNOSIS — Z7901 Long term (current) use of anticoagulants: Secondary | ICD-10-CM | POA: Diagnosis not present

## 2019-07-15 DIAGNOSIS — Z885 Allergy status to narcotic agent status: Secondary | ICD-10-CM | POA: Diagnosis not present

## 2019-07-15 DIAGNOSIS — Z888 Allergy status to other drugs, medicaments and biological substances status: Secondary | ICD-10-CM | POA: Diagnosis not present

## 2019-07-15 DIAGNOSIS — S72001A Fracture of unspecified part of neck of right femur, initial encounter for closed fracture: Secondary | ICD-10-CM | POA: Diagnosis not present

## 2019-07-15 LAB — URINALYSIS, ROUTINE W REFLEX MICROSCOPIC
Bilirubin Urine: NEGATIVE
Glucose, UA: >=500 mg/dL — AB
Hgb urine dipstick: NEGATIVE
Ketones, ur: NEGATIVE mg/dL
Leukocytes,Ua: NEGATIVE
Nitrite: NEGATIVE
Protein, ur: 100 mg/dL — AB
Specific Gravity, Urine: 1.02 (ref 1.005–1.030)
Squamous Epithelial / HPF: NONE SEEN (ref 0–5)
pH: 5 (ref 5.0–8.0)

## 2019-07-15 LAB — ECHOCARDIOGRAM COMPLETE
Height: 72 in
Weight: 4160 oz

## 2019-07-15 LAB — GLUCOSE, CAPILLARY
Glucose-Capillary: 266 mg/dL — ABNORMAL HIGH (ref 70–99)
Glucose-Capillary: 239 mg/dL — ABNORMAL HIGH (ref 70–99)
Glucose-Capillary: 213 mg/dL — ABNORMAL HIGH (ref 70–99)
Glucose-Capillary: 163 mg/dL — ABNORMAL HIGH (ref 70–99)
Glucose-Capillary: 155 mg/dL — ABNORMAL HIGH (ref 70–99)

## 2019-07-15 LAB — CBC
HCT: 40.9 % (ref 36.0–46.0)
HCT: 41.1 % (ref 36.0–46.0)
Hemoglobin: 13.2 g/dL (ref 12.0–15.0)
Hemoglobin: 13.6 g/dL (ref 12.0–15.0)
MCH: 26.7 pg (ref 26.0–34.0)
MCH: 26.9 pg (ref 26.0–34.0)
MCHC: 32.3 g/dL (ref 30.0–36.0)
MCHC: 33.1 g/dL (ref 30.0–36.0)
MCV: 82.6 fL (ref 80.0–100.0)
MCV: 81.4 fL (ref 80.0–100.0)
Platelets: 257 10*3/uL (ref 150–400)
Platelets: 261 10*3/uL (ref 150–400)
RBC: 4.95 MIL/uL (ref 3.87–5.11)
RBC: 5.05 MIL/uL (ref 3.87–5.11)
RDW: 15.9 % — ABNORMAL HIGH (ref 11.5–15.5)
RDW: 16.1 % — ABNORMAL HIGH (ref 11.5–15.5)
WBC: 13.3 10*3/uL — ABNORMAL HIGH (ref 4.0–10.5)
WBC: 13.4 10*3/uL — ABNORMAL HIGH (ref 4.0–10.5)
nRBC: 0 % (ref 0.0–0.2)
nRBC: 0 % (ref 0.0–0.2)

## 2019-07-15 LAB — PROTIME-INR
INR: 2 — ABNORMAL HIGH (ref 0.8–1.2)
INR: 1.7 — ABNORMAL HIGH (ref 0.8–1.2)
Prothrombin Time: 21.8 seconds — ABNORMAL HIGH (ref 11.4–15.2)
Prothrombin Time: 19.2 seconds — ABNORMAL HIGH (ref 11.4–15.2)

## 2019-07-15 LAB — BASIC METABOLIC PANEL
Anion gap: 7 (ref 5–15)
BUN: 19 mg/dL (ref 8–23)
CO2: 26 mmol/L (ref 22–32)
Calcium: 8.4 mg/dL — ABNORMAL LOW (ref 8.9–10.3)
Chloride: 103 mmol/L (ref 98–111)
Creatinine, Ser: 0.75 mg/dL (ref 0.44–1.00)
GFR calc Af Amer: >60 mL/min (ref >60)
GFR calc non Af Amer: >60 mL/min (ref >60)
Glucose, Bld: 262 mg/dL — ABNORMAL HIGH (ref 70–99)
Potassium: 4.7 mmol/L (ref 3.5–5.1)
Sodium: 136 mmol/L (ref 135–145)

## 2019-07-15 LAB — HEMOGLOBIN A1C
Hgb A1c MFr Bld: 8.8 % — ABNORMAL HIGH (ref 4.8–5.6)
Mean Plasma Glucose: 205.86 mg/dL

## 2019-07-15 LAB — SURGICAL PCR SCREEN
MRSA, PCR: NEGATIVE
Staphylococcus aureus: NEGATIVE

## 2019-07-15 LAB — CREATININE, SERUM
Creatinine, Ser: 0.75 mg/dL (ref 0.44–1.00)
GFR calc Af Amer: >60 mL/min (ref >60)
GFR calc non Af Amer: >60 mL/min (ref >60)

## 2019-07-15 LAB — TROPONIN I (HIGH SENSITIVITY): Troponin I (High Sensitivity): 13 ng/L (ref <18)

## 2019-07-15 LAB — SARS CORONAVIRUS 2 BY RT PCR (HOSPITAL ORDER, PERFORMED IN ~~LOC~~ HOSPITAL LAB): SARS Coronavirus 2: NEGATIVE

## 2019-07-15 MED ORDER — AMLODIPINE BESYLATE 10 MG PO TABS
10.0000 mg | ORAL_TABLET | Freq: Every day | ORAL | Status: DC
  Administered 2019-07-17 – 2019-07-19 (×3): 10 mg via ORAL
  Filled 2019-07-15 (×4): qty 1

## 2019-07-15 MED ORDER — LISINOPRIL 10 MG PO TABS
10.0000 mg | ORAL_TABLET | Freq: Every day | ORAL | Status: DC
  Administered 2019-07-16 – 2019-07-19 (×4): 10 mg via ORAL
  Filled 2019-07-15 (×4): qty 1

## 2019-07-15 MED ORDER — MORPHINE SULFATE (PF) 2 MG/ML IV SOLN
1.0000 mg | INTRAVENOUS | Status: DC | PRN
  Filled 2019-07-15: qty 1

## 2019-07-15 MED ORDER — HEPARIN SODIUM (PORCINE) 5000 UNIT/ML IJ SOLN
5000.0000 [IU] | Freq: Three times a day (TID) | INTRAMUSCULAR | Status: AC
  Administered 2019-07-15 (×2): 5000 [IU] via SUBCUTANEOUS
  Filled 2019-07-15 (×2): qty 1

## 2019-07-15 MED ORDER — ISOSORBIDE MONONITRATE ER 30 MG PO TB24
30.0000 mg | ORAL_TABLET | Freq: Every day | ORAL | Status: DC
  Administered 2019-07-17 – 2019-07-19 (×3): 30 mg via ORAL
  Filled 2019-07-15 (×4): qty 1

## 2019-07-15 MED ORDER — VITAMIN K1 10 MG/ML IJ SOLN
10.0000 mg | Freq: Once | INTRAMUSCULAR | Status: AC
  Administered 2019-07-15: 10 mg via SUBCUTANEOUS
  Filled 2019-07-15: qty 1

## 2019-07-15 MED ORDER — MORPHINE SULFATE (PF) 2 MG/ML IV SOLN
2.0000 mg | INTRAVENOUS | Status: DC | PRN
  Administered 2019-07-15 – 2019-07-18 (×3): 2 mg via INTRAVENOUS
  Filled 2019-07-15 (×3): qty 1

## 2019-07-15 MED ORDER — ORAL CARE MOUTH RINSE
15.0000 mL | Freq: Two times a day (BID) | OROMUCOSAL | Status: DC
  Administered 2019-07-16 – 2019-07-19 (×4): 15 mL via OROMUCOSAL

## 2019-07-15 MED ORDER — HYDROCODONE-ACETAMINOPHEN 5-325 MG PO TABS
1.0000 | ORAL_TABLET | ORAL | Status: DC | PRN
  Administered 2019-07-15 – 2019-07-18 (×3): 1 via ORAL
  Filled 2019-07-15 (×4): qty 1

## 2019-07-15 MED ORDER — METOPROLOL SUCCINATE ER 50 MG PO TB24
50.0000 mg | ORAL_TABLET | Freq: Every day | ORAL | Status: DC
  Administered 2019-07-15 – 2019-07-19 (×5): 50 mg via ORAL
  Filled 2019-07-15 (×5): qty 1

## 2019-07-15 MED ORDER — INSULIN ASPART 100 UNIT/ML ~~LOC~~ SOLN
0.0000 [IU] | SUBCUTANEOUS | Status: DC
  Administered 2019-07-15: 5 [IU] via SUBCUTANEOUS
  Administered 2019-07-15 (×2): 3 [IU] via SUBCUTANEOUS
  Administered 2019-07-15: 5 [IU] via SUBCUTANEOUS
  Administered 2019-07-16: 11 [IU] via SUBCUTANEOUS
  Administered 2019-07-16: 3 [IU] via SUBCUTANEOUS
  Administered 2019-07-16: 15 [IU] via SUBCUTANEOUS
  Administered 2019-07-17: 5 [IU] via SUBCUTANEOUS
  Administered 2019-07-17: 8 [IU] via SUBCUTANEOUS
  Administered 2019-07-17: 3 [IU] via SUBCUTANEOUS
  Administered 2019-07-17: 5 [IU] via SUBCUTANEOUS
  Administered 2019-07-17 – 2019-07-18 (×2): 8 [IU] via SUBCUTANEOUS
  Administered 2019-07-18 (×2): 3 [IU] via SUBCUTANEOUS
  Administered 2019-07-18: 8 [IU] via SUBCUTANEOUS
  Administered 2019-07-18: 5 [IU] via SUBCUTANEOUS
  Administered 2019-07-18: 8 [IU] via SUBCUTANEOUS
  Administered 2019-07-19 (×2): 2 [IU] via SUBCUTANEOUS
  Administered 2019-07-19: 3 [IU] via SUBCUTANEOUS
  Filled 2019-07-15 (×23): qty 1

## 2019-07-15 NOTE — H&P (Signed)
History and Physical   [facility] [place] [facility] Admission History and Physical [employer], D.O.    Patient Name: Alisha Winters MR#: [id number] Date of Birth: 04/29/1932 Date of Admission: 07/14/2019  Referring MD/NP/PA: Dr. Joni Fears Primary Care Physician: Derinda Late, MD  Chief Complaint:  Chief Complaint  Patient presents with  . Fall    HPI: Alisha Winters is a 86 y.o. female with a known history of afib on Coumadin,  HTN, CAD, DM, CVA, anemia presents to the emergency department for evaluation of right hip pain..  Patient was in a usual state of health until earlier today when she had an episode of vertigo and sustained a mechanical fall with head trauma but no LOC.  She comes to the ER with complaint of right hip pain, unable to move, stand or walk.   She denies having ever seen a cardiologist.  Patient denies fevers/chills, weakness, dizziness, chest pain, shortness of breath, N/V/C/D, abdominal pain, dysuria/frequency, changes in mental status.    Otherwise there has been no change in status. Patient has been taking medication as prescribed and there has been no recent change in medication or diet.  No recent antibiotics.  There has been no recent illness, hospitalizations, travel or sick contacts.    EMS/ED Course: Patient received Ancef, Clind, morphine . Medical admission has been requested for further management of R hip fracture.  Review of Systems:  CONSTITUTIONAL: Positive dizziness. No fever/chills, fatigue, weakness, weight gain/loss, headache. EYES: No blurry or double vision. ENT: No tinnitus, postnasal drip, redness or soreness of the oropharynx. RESPIRATORY: No cough, dyspnea, wheeze.  No hemoptysis.  CARDIOVASCULAR: No chest pain, palpitations, syncope, orthopnea. No lower extremity edema.  GASTROINTESTINAL: No nausea, vomiting, abdominal pain, diarrhea, constipation.  No hematemesis, melena or hematochezia. GENITOURINARY: No dysuria,  frequency, hematuria. ENDOCRINE: No polyuria or nocturia. No heat or cold intolerance. HEMATOLOGY: No anemia, bruising, bleeding. INTEGUMENTARY: No rashes, ulcers, lesions. MUSCULOSKELETAL: Positive R hip pain as per HPI. No arthritis, gout, dyspnea. NEUROLOGIC: No numbness, tingling, ataxia, seizure-type activity, weakness. PSYCHIATRIC: No anxiety, depression, insomnia.   Past Medical History:  Diagnosis Date  . Anemia   . Cancer ([place])   . Coronary artery disease   . Depression   . Diabetes mellitus without complication ([place])   . Hypertension   . Stroke [facility])     Past Surgical History:  Procedure Laterality Date  . CHOLECYSTECTOMY    . COLON SURGERY    . EYE SURGERY    . HERNIA REPAIR       reports that she has never smoked. She has never used smokeless tobacco. She reports that she does not drink alcohol and does not use drugs.  Allergies  Allergen Reactions  . Penicillins Rash  . Atorvastatin Diarrhea  . Citalopram Nausea Only  . Codeine Itching  . Meloxicam Nausea Only  . Simvastatin Other (See Comments)    Muscle pain    No family history on file.  Prior to Admission medications   Medication Sig Start Date End Date Taking? Authorizing Provider  amLODipine (NORVASC) 10 MG tablet Take 10 mg by mouth daily.    [provider]  aspirin 81 MG tablet Take 81 mg by mouth daily.    [provider]  ciprofloxacin (CIPRO) 500 MG tablet Take 1 tablet (500 mg total) by mouth 2 (two) times daily. 02/12/15   Marylene Land, NP  insulin NPH-regular Human (NOVOLIN 70/30) (70-30) 100 UNIT/ML injection Inject into the skin 2 (  two) times daily with a meal.    [provider]  isosorbide mononitrate (IMDUR) 30 MG 24 hr tablet Take 30 mg by mouth daily.    [provider]  lisinopril (PRINIVIL,ZESTRIL) 10 MG tablet Take 10 mg by mouth daily.    [provider]  metoprolol succinate (TOPROL-XL) 50 MG 24 hr tablet Take 50 mg by mouth daily.  Take with or immediately following a meal.    [provider]  Omega-3 Fatty Acids (FISH OIL PO) Take 1 capsule by mouth daily.    [provider]  Probiotic Product (ALIGN) 4 MG CAPS Take 1 capsule by mouth daily.    [provider]  psyllium (METAMUCIL) 58.6 % packet Take 1 packet by mouth daily.    [provider]  vitamin B-12 (CYANOCOBALAMIN) 1000 MCG tablet Take 1,000 mcg by mouth daily.    [provider]    Physical Exam: Vitals:   07/14/19 2219 07/14/19 2221  BP:  (!) 173/74  Pulse:  71  Resp:  19  Temp:  98.1 F (36.7 C)  TempSrc:  Oral  SpO2:  96%  Weight: 117.9 kg   Height: 6' (1.829 m)     GENERAL: 86 y.o.-year-old female patient, well-developed, well-nourished lying in the bed in no acute distress.  Pleasant and cooperative.   HEENT: Head atraumatic, normocephalic. Pupils equal. Mucus membranes moist. NECK: Supple. No JVD. CHEST: Normal breath sounds bilaterally. No wheezing, rales, rhonchi or crackles. No use of accessory muscles of respiration.  No reproducible chest wall tenderness.  CARDIOVASCULAR: Irreg, irreg. S1, S2 normal. No murmurs, rubs, or gallops. Cap refill <2 seconds. Pulses intact distally.  ABDOMEN: Soft, nondistended, nontender. No rebound, guarding, rigidity. Normoactive bowel sounds present in all four quadrants.  EXTREMITIES: R hip tender to palp.  R leg short and externally rotated.  Mild pedal edema.  No cyanosis, or clubbing. No calf tenderness or Homan's sign.  NEUROLOGIC: The patient is alert and oriented x 3. Cranial nerves II through XII are grossly intact with no focal sensorimotor deficit. PSYCHIATRIC:  Normal affect, mood, thought content. SKIN: Warm, dry, and intact without obvious rash, lesion, or ulcer.    Labs on Admission:  CBC: Recent Labs  Lab 07/14/19 2314  WBC 9.4  NEUTROABS 7.3  HGB 12.9  HCT 39.9  MCV 82.4  PLT 211   Basic Metabolic Panel: Recent Labs  Lab  07/14/19 2314  NA 135  K 4.0  CL 102  CO2 24  GLUCOSE 258*  BUN 22  CREATININE 0.89  CALCIUM 8.3*   GFR: Estimated Creatinine Clearance: 66.4 mL/min (by C-G formula based on SCr of 0.89 mg/dL). Liver Function Tests: No results for input(s): AST, ALT, ALKPHOS, BILITOT, PROT, ALBUMIN in the last 168 hours. No results for input(s): LIPASE, AMYLASE in the last 168 hours. No results for input(s): AMMONIA in the last 168 hours. Coagulation Profile: Recent Labs  Lab 07/14/19 2314  INR 2.4*   Cardiac Enzymes: No results for input(s): CKTOTAL, CKMB, CKMBINDEX, TROPONINI in the last 168 hours. BNP (last 3 results) No results for input(s): PROBNP in the last 8760 hours. HbA1C: No results for input(s): HGBA1C in the last 72 hours. CBG: No results for input(s): GLUCAP in the last 168 hours. Lipid Profile: No results for input(s): CHOL, HDL, LDLCALC, TRIG, CHOLHDL, LDLDIRECT in the last 72 hours. Thyroid Function Tests: No results for input(s): TSH, T4TOTAL, FREET4, T3FREE, THYROIDAB in the last 72 hours. Anemia Panel: No results for  input(s): VITAMINB12, FOLATE, FERRITIN, TIBC, IRON, RETICCTPCT in the last 72 hours. Urine analysis: No results found for: COLORURINE, APPEARANCEUR, LABSPEC, PHURINE, GLUCOSEU, HGBUR, BILIRUBINUR, KETONESUR, PROTEINUR, UROBILINOGEN, NITRITE, LEUKOCYTESUR Sepsis Labs: @LABRCNTIP (procalcitonin:4,lacticidven:4) )No results found for this or any previous visit (from the past 240 hour(s)).   Radiological Exams on Admission: DG Chest 1 View  Result Date: 07/14/2019 CLINICAL DATA:  Fall with right hip pain EXAM: CHEST  1 VIEW COMPARISON:  None. FINDINGS: Right hemidiaphragm is elevated. There is bibasilar atelectasis. No focal consolidation pulmonary edema. Normal cardiomediastinal contours. No pleural effusion. IMPRESSION: Right hemidiaphragm elevation with bibasilar atelectasis. Electronically Signed   By: Ulyses Jarred M.D.   On: 07/14/2019 22:51   CT HEAD  WO CONTRAST  Result Date: 07/14/2019 CLINICAL DATA:  Fall EXAM: CT HEAD WITHOUT CONTRAST TECHNIQUE: Contiguous axial images were obtained from the base of the skull through the vertex without intravenous contrast. COMPARISON:  None. FINDINGS: Brain: There is no mass, hemorrhage or extra-axial collection. The size and configuration of the ventricles and extra-axial CSF spaces are normal. The brain parenchyma is normal, without acute or chronic infarction. Vascular: No abnormal hyperdensity of the major intracranial arteries or dural venous sinuses. No intracranial atherosclerosis. Skull: The visualized skull base, calvarium and extracranial soft tissues are normal. Sinuses/Orbits: No fluid levels or advanced mucosal thickening of the visualized paranasal sinuses. No mastoid or middle ear effusion. The orbits are normal. IMPRESSION: Normal head CT. Electronically Signed   By: Ulyses Jarred M.D.   On: 07/14/2019 22:56   DG Hip Unilat W or Wo Pelvis 2-3 Views Right  Result Date: 07/14/2019 CLINICAL DATA:  [place], right hip pain EXAM: DG HIP (WITH OR WITHOUT PELVIS) 2-3V RIGHT COMPARISON:  None. FINDINGS: Frontal view of the pelvis as well as frontal and cross-table lateral views of the right hip are obtained. There is an impacted basicervical right hip fracture with varus angulation. No dislocation. The remainder of the bony pelvis is unremarkable. Sacroiliac joints are normal. IMPRESSION: 1. Impacted basicervical right hip fracture with varus angulation. Electronically Signed   By: Randa Ngo M.D.   On: 07/14/2019 22:52    EKG: Atrial fibrillation at 68 bpm with normal axis and nonspecific ST-T wave changes.   Assessment/Plan  This is a 86 y.o. female with a history of afib on Coumadin,  HTN, CAD, DM, CVA, anemia now being admitted with:  #. R intertrochanteric hip fracture - Admit IP - NPO for OR in AM - Pain management - Check echo and obtain cardio consult for preop evaluation given multiple  cardiac risk factors.   - Surgery consulted by EDP - Dr. Sabra Heck  #. Afib on Coumadin, rate controlled - Trend INR - Continue metoprolol  #. History of HTN - Continue Norvasc, Imdur  #. History of CVA - Hold aspirin for OR  #. H/o Diabetes - Accuchecks q4h with RISS coverage - Hold NPH   Admission status: IP, tele mon IV Fluids: HL Diet/Nutrition: NPO Consults called: Ortho  DVT Px: Heparin, SCDs and early ambulation. Code Status: Full Code  Disposition Plan: To be determined.   All the records are reviewed and case discussed with ED provider. Management plans discussed with the patient and/or family who express understanding and agree with plan of care.  Alisha Winters D.O. on 07/15/2019 at 12:00 AM CC: Primary care physician; Derinda Late, MD   07/15/2019, 12:00 AM

## 2019-07-15 NOTE — Consult Note (Signed)
[facility] Consultation Note  Patient ID: Alisha Winters, MRN: [id number], DOB/AGE: 1932/04/18 86 y.o. Admit date: 07/14/2019   Date of Consult: 07/15/2019 Primary Physician: Derinda Late, MD Primary Cardiologist: None  Chief Complaint:  Chief Complaint  Patient presents with  . Fall   Reason for Consult: Atrial fibrillation coronary atherosclerosis and hip fracture  HPI: 86 y.o. female with apparent known chronic nonvalvular atrial fibrillation with good heart rate control on metoprolol hypertension on amlodipine coronary artery disease with no apparent previous myocardial infarction but on isosorbide who has had a difficulty walking around and has had now fall with a hip fracture.  The patient previously has not had any reports of congestive heart failure type symptoms and/or anginal type symptoms.  The patient has had a troponin of 13 a chest x-ray showing atelectasis but otherwise laboratory work is within normal limits.  EKG shows atrial fibrillation with controlled ventricular rate and she is hemodynamically stable and feeling relatively well at this time other than her hip fracture.  Past Medical History:  Diagnosis Date  . Anemia   . Cancer ([place])   . Coronary artery disease   . Depression   . Diabetes mellitus without complication ([place])   . Hypertension   . Stroke [facility])       Surgical History:  Past Surgical History:  Procedure Laterality Date  . CHOLECYSTECTOMY    . COLON SURGERY    . EYE SURGERY    . HERNIA REPAIR       Home Meds: Prior to Admission medications   Medication Sig Start Date End Date Taking? Authorizing Provider  albuterol (VENTOLIN HFA) 108 (90 Base) MCG/ACT inhaler Inhale 2 puffs into the lungs every 6 (six) hours as needed for wheezing. 09/19/17  Yes [provider]  acetaminophen (TYLENOL) 500 MG tablet Take 500 mg by mouth at bedtime.    [provider]  amLODipine (NORVASC) 10 MG tablet Take 10 mg by mouth  daily.    [provider]  aspirin 81 MG tablet Take 81 mg by mouth daily.    [provider]  ciprofloxacin (CIPRO) 500 MG tablet Take 1 tablet (500 mg total) by mouth 2 (two) times daily. 02/12/15   Marylene Land, NP  furosemide (LASIX) 20 MG tablet Take 40 mg by mouth daily. 06/28/19   [provider]  insulin NPH-regular Human (NOVOLIN 70/30) (70-30) 100 UNIT/ML injection Inject into the skin 2 (two) times daily with a meal.    [provider]  isosorbide mononitrate (IMDUR) 30 MG 24 hr tablet Take 30 mg by mouth daily.    [provider]  lisinopril (PRINIVIL,ZESTRIL) 10 MG tablet Take 10 mg by mouth daily.    [provider]  metoprolol succinate (TOPROL-XL) 50 MG 24 hr tablet Take 50 mg by mouth daily. Take with or immediately following a meal.    [provider]  Omega-3 Fatty Acids (FISH OIL PO) Take 1 capsule by mouth daily.    [provider]  Probiotic Product (ALIGN) 4 MG CAPS Take 1 capsule by mouth daily.    [provider]  psyllium (METAMUCIL) 58.6 % packet Take 1 packet by mouth daily.    [provider]  vitamin B-12 (CYANOCOBALAMIN) 1000 MCG tablet Take 1,000 mcg by mouth daily.    [provider]  warfarin (COUMADIN) 5 MG tablet Take 5 mg by mouth daily. 03/16/19   [provider]    Inpatient Medications:  . amLODipine  10 mg Oral Daily  . chlorhexidine  1 application Topical Once  . heparin  5,000 Units Subcutaneous Q8H  . insulin aspart  0-15 Units Subcutaneous Q4H  . isosorbide mononitrate  30 mg Oral Daily  . lisinopril  10 mg Oral Daily  . mouth rinse  15 mL Mouth Rinse BID  . metoprolol succinate  50 mg Oral Daily   . sodium chloride 75 mL/hr at 07/15/19 0201  .  ceFAZolin (ANCEF) IV    . clindamycin (CLEOCIN) IV      Allergies:  Allergies  Allergen Reactions  . Penicillins Rash  . Atorvastatin Diarrhea  . Citalopram Nausea Only  . Codeine Itching   . Meloxicam Nausea Only  . Simvastatin Other (See Comments)    Muscle pain    Social History   Socioeconomic History  . Marital status: Married    Spouse name: Not on file  . Number of children: Not on file  . Years of education: Not on file  . Highest education level: Not on file  Occupational History  . Not on file  Tobacco Use  . Smoking status: Never Smoker  . Smokeless tobacco: Never Used  Substance and Sexual Activity  . Alcohol use: No  . Drug use: No  . Sexual activity: Not on file  Other Topics Concern  . Not on file  Social History Narrative  . Not on file   Social Determinants of Health   Financial Resource Strain:   . Difficulty of Paying Living Expenses:   Food Insecurity:   . Worried About Charity fundraiser in the Last Year:   . Arboriculturist in the Last Year:   Transportation Needs:   . Film/video editor (Medical):   Marland Kitchen Lack of Transportation (Non-Medical):   Physical Activity:   . Days of Exercise per Week:   . Minutes of Exercise per Session:   Stress:   . Feeling of Stress :   Social Connections:   . Frequency of Communication with Friends and Family:   . Frequency of Social Gatherings with Friends and Family:   . Attends Religious Services:   . Active Member of Clubs or Organizations:   . Attends Archivist Meetings:   Marland Kitchen Marital Status:   Intimate Partner Violence:   . Fear of Current or Ex-Partner:   . Emotionally Abused:   Marland Kitchen Physically Abused:   . Sexually Abused:      No family history on file.   Review of Systems Positive for hip pain Negative for: General:  chills, fever, night sweats or weight changes.  Cardiovascular: PND orthopnea syncope dizziness  Dermatological skin lesions rashes Respiratory: Cough congestion Urologic: Frequent urination urination at night and hematuria Abdominal: negative for nausea, vomiting, diarrhea, bright red blood per rectum, melena, or hematemesis Neurologic: negative for  visual changes, and/or hearing changes  All other systems reviewed and are otherwise negative except as noted above.  Labs: No results for input(s): CKTOTAL, CKMB, TROPONINI in the last 72 hours. Lab Results  Component Value Date   WBC 13.4 (H) 07/15/2019   HGB 13.6 07/15/2019   HCT 41.1 07/15/2019   MCV 81.4 07/15/2019   PLT 261 07/15/2019    Recent Labs  Lab 07/15/19 0507  NA 136  K 4.7  CL 103  CO2 26  BUN 19  CREATININE 0.75  CALCIUM 8.4*  GLUCOSE 262*   No results found for: CHOL, HDL, LDLCALC, TRIG No results found for: DDIMER  Radiology/Studies:  DG Chest 1 View  Result Date: 07/14/2019 CLINICAL DATA:  Fall with right hip pain EXAM: CHEST  1 VIEW COMPARISON:  None. FINDINGS: Right hemidiaphragm is elevated. There is bibasilar atelectasis. No focal consolidation pulmonary edema. Normal cardiomediastinal contours. No pleural effusion. IMPRESSION: Right hemidiaphragm elevation with bibasilar atelectasis. Electronically Signed   By: Ulyses Jarred M.D.   On: 07/14/2019 22:51   CT HEAD WO CONTRAST  Result Date: 07/14/2019 CLINICAL DATA:  Fall EXAM: CT HEAD WITHOUT CONTRAST TECHNIQUE: Contiguous axial images were obtained from the base of the skull through the vertex without intravenous contrast. COMPARISON:  None. FINDINGS: Brain: There is no mass, hemorrhage or extra-axial collection. The size and configuration of the ventricles and extra-axial CSF spaces are normal. The brain parenchyma is normal, without acute or chronic infarction. Vascular: No abnormal hyperdensity of the major intracranial arteries or dural venous sinuses. No intracranial atherosclerosis. Skull: The visualized skull base, calvarium and extracranial soft tissues are normal. Sinuses/Orbits: No fluid levels or advanced mucosal thickening of the visualized paranasal sinuses. No mastoid or middle ear effusion. The orbits are normal. IMPRESSION: Normal head CT. Electronically Signed   By: Ulyses Jarred M.D.   On:  07/14/2019 22:56   CT HIP RIGHT WO CONTRAST  Result Date: 07/15/2019 CLINICAL DATA:  Right hip pain after fall EXAM: CT OF THE RIGHT HIP WITHOUT CONTRAST TECHNIQUE: Multidetector CT imaging of the right hip was performed according to the standard protocol. Multiplanar CT image reconstructions were also generated. COMPARISON:  None. FINDINGS: Bones/Joint/Cartilage There is a transverse fracture of the right femoral neck with mild posterior angulation. There is foreshortening of the neck. No acetabular fracture. No dislocation of the femoral head. Ligaments Suboptimally assessed by CT. Muscles and Tendons No intramuscular hematoma. Soft tissues Unremarkable IMPRESSION: Transverse fracture of the right femoral neck with mild posterior angulation and foreshortening of the neck. Electronically Signed   By: Ulyses Jarred M.D.   On: 07/15/2019 00:20   DG Hip Unilat W or Wo Pelvis 2-3 Views Right  Result Date: 07/14/2019 CLINICAL DATA:  [place], right hip pain EXAM: DG HIP (WITH OR WITHOUT PELVIS) 2-3V RIGHT COMPARISON:  None. FINDINGS: Frontal view of the pelvis as well as frontal and cross-table lateral views of the right hip are obtained. There is an impacted basicervical right hip fracture with varus angulation. No dislocation. The remainder of the bony pelvis is unremarkable. Sacroiliac joints are normal. IMPRESSION: 1. Impacted basicervical right hip fracture with varus angulation. Electronically Signed   By: Randa Ngo M.D.   On: 07/14/2019 22:52    EKG: Atrial fibrillation with controlled ventricular rate  Weights: Filed Weights   07/14/19 2219  Weight: 117.9 kg     Physical Exam: Blood pressure (!) 155/77, pulse 70, temperature 98.5 F (36.9 C), temperature source Oral, resp. rate 16, height 6' (1.829 m), weight 117.9 kg, SpO2 99 %. Body mass index is 35.26 kg/m. General: Well developed, well nourished, in no acute distress. Head eyes ears nose throat: Normocephalic, atraumatic, sclera  non-icteric, no xanthomas, nares are without discharge. No apparent thyromegaly and/or mass  Lungs: Normal respiratory effort.  no wheezes, no rales, no rhonchi.  Heart: Irregular with normal S1 S2. no murmur gallop, no rub, PMI is normal size and placement, carotid upstroke normal without bruit, jugular venous pressure is normal Abdomen: Soft, non-tender, non-distended with normoactive bowel sounds. No hepatomegaly. No rebound/guarding. No obvious abdominal masses. Abdominal aorta is normal size without bruit Extremities: Trace edema. no  cyanosis, no clubbing, no ulcers  Peripheral : 2+ bilateral upper extremity pulses, 2+ bilateral femoral pulses, 2+ bilateral dorsal pedal pulse Neuro: Alert and oriented. No facial asymmetry. No focal deficit. Moves all extremities spontaneously. Musculoskeletal: Normal muscle tone without kyphosis Psych:  Responds to questions appropriately with a normal affect.    Assessment: 86 year old female with chronic nonvalvular atrial fibrillation coronary artery atherosclerosis hypertension hyperlipidemia on previously appropriate medication management for these issues without evidence of myocardial infarction anginal symptoms acute coronary syndrome or congestive heart failure needing treatment for hip fracture.  Patient is at lowest risk possible at this time for orthopedic surgery  Plan: 1.  Proceed to orthopedic surgery without restriction at this time due to no current evidence of congestive heart failure or anginal symptoms myocardial infarction at this time 2.  Continue metoprolol for heart rate control of atrial fibrillation 3.  Hypertension control with metoprolol lisinopril amlodipine combination 4.  Close monitoring during surgery for needing adjustments for medications as per above for heart rate control 5.  Discontinuation of Coumadin to reduce bleeding risks with surgical intervention with heparin to reduce stroke risk with atrial fibrillation and  reinstatement of Coumadin when appropriate after surgery  Signed, Corey Skains M.D. [facility] 07/15/2019, 9:14 AM

## 2019-07-15 NOTE — Progress Notes (Signed)
*  PRELIMINARY RESULTS* Echocardiogram 2D Echocardiogram has been performed.  Jonette Mate Wally Behan 07/15/2019, 12:54 PM

## 2019-07-15 NOTE — Consult Note (Signed)
ORTHOPAEDIC CONSULTATION  REQUESTING PHYSICIAN: Nolberto Hanlon, MD  Chief Complaint: Right hip pain  HPI: Alisha Winters is a 86 y.o. female who complains of right hip pain after a fall at home last evening.  Patient has vertigo and lost her balance getting up and injured her right hip.  She was brought to the emergency room where exam and x-rays revealed slightly angulated femoral neck fracture on the right.  Patient is on chronic Coumadin for atrial fibrillation.  Also diabetic.  I have discussed surgery with the patient and her daughter and grandson.  Goal options included percutaneous pinning versus hemiarthroplasty.  The risks and benefits of each procedure and postop protocol were discussed.  The family would prefer to try percutaneous pinning of the fracture with the understanding that arthroplasty surgery might be required at a later date.  We will proceed tomorrow with surgery.  We are currently correcting her INR level.  Dr.Kowalski from cardiology has cleared her for surgery.  She is getting subcu heparin until midnight tonight.  She will require skilled nursing rehabilitation and the family is aware of this.  Past Medical History:  Diagnosis Date  . Anemia   . Cancer ([place])   . Coronary artery disease   . Depression   . Diabetes mellitus without complication ([place])   . Hypertension   . Stroke [facility])    Past Surgical History:  Procedure Laterality Date  . CHOLECYSTECTOMY    . COLON SURGERY    . EYE SURGERY    . HERNIA REPAIR     Social History   Socioeconomic History  . Marital status: Married    Spouse name: Not on file  . Number of children: Not on file  . Years of education: Not on file  . Highest education level: Not on file  Occupational History  . Not on file  Tobacco Use  . Smoking status: Never Smoker  . Smokeless tobacco: Never Used  Substance and Sexual Activity  . Alcohol use: No  . Drug use: No  . Sexual activity: Not on file  Other Topics Concern  .  Not on file  Social History Narrative  . Not on file   Social Determinants of Health   Financial Resource Strain:   . Difficulty of Paying Living Expenses:   Food Insecurity:   . Worried About Charity fundraiser in the Last Year:   . Arboriculturist in the Last Year:   Transportation Needs:   . Film/video editor (Medical):   Marland Kitchen Lack of Transportation (Non-Medical):   Physical Activity:   . Days of Exercise per Week:   . Minutes of Exercise per Session:   Stress:   . Feeling of Stress :   Social Connections:   . Frequency of Communication with Friends and Family:   . Frequency of Social Gatherings with Friends and Family:   . Attends Religious Services:   . Active Member of Clubs or Organizations:   . Attends Archivist Meetings:   Marland Kitchen Marital Status:    No family history on file. Allergies  Allergen Reactions  . Penicillins Rash  . Atorvastatin Diarrhea  . Citalopram Nausea Only  . Codeine Itching  . Meloxicam Nausea Only  . Simvastatin Other (See Comments)    Muscle pain   Prior to Admission medications   Medication Sig Start Date End Date Taking? Authorizing Provider  albuterol (VENTOLIN HFA) 108 (90 Base) MCG/ACT inhaler Inhale 2 puffs into the lungs  every 6 (six) hours as needed for wheezing. 09/19/17  Yes [provider]  acetaminophen (TYLENOL) 500 MG tablet Take 500 mg by mouth at bedtime.    [provider]  amLODipine (NORVASC) 10 MG tablet Take 10 mg by mouth daily.    [provider]  aspirin 81 MG tablet Take 81 mg by mouth daily.    [provider]  ciprofloxacin (CIPRO) 500 MG tablet Take 1 tablet (500 mg total) by mouth 2 (two) times daily. 02/12/15   Marylene Land, NP  furosemide (LASIX) 20 MG tablet Take 40 mg by mouth daily. 06/28/19   [provider]  insulin NPH-regular Human (NOVOLIN 70/30) (70-30) 100 UNIT/ML injection Inject into the skin 2 (two) times daily with a meal.    [provider]  isosorbide mononitrate (IMDUR) 30 MG 24 hr tablet Take 30 mg by mouth daily.    [provider]  lisinopril (PRINIVIL,ZESTRIL) 10 MG tablet Take 10 mg by mouth daily.    [provider]  metoprolol succinate (TOPROL-XL) 50 MG 24 hr tablet Take 50 mg by mouth daily. Take with or immediately following a meal.    [provider]  Omega-3 Fatty Acids (FISH OIL PO) Take 1 capsule by mouth daily.    [provider]  Probiotic Product (ALIGN) 4 MG CAPS Take 1 capsule by mouth daily.    [provider]  psyllium (METAMUCIL) 58.6 % packet Take 1 packet by mouth daily.    [provider]  vitamin B-12 (CYANOCOBALAMIN) 1000 MCG tablet Take 1,000 mcg by mouth daily.    [provider]  warfarin (COUMADIN) 5 MG tablet Take 5 mg by mouth daily. 03/16/19   [provider]   DG Chest 1 View  Result Date: 07/14/2019 CLINICAL DATA:  Fall with right hip pain EXAM: CHEST  1 VIEW COMPARISON:  None. FINDINGS: Right hemidiaphragm is elevated. There is bibasilar atelectasis. No focal consolidation pulmonary edema. Normal cardiomediastinal contours. No pleural effusion. IMPRESSION: Right hemidiaphragm elevation with bibasilar atelectasis. Electronically Signed   By: Ulyses Jarred M.D.   On: 07/14/2019 22:51   CT HEAD WO CONTRAST  Result Date: 07/14/2019 CLINICAL DATA:  Fall EXAM: CT HEAD WITHOUT CONTRAST TECHNIQUE: Contiguous axial images were obtained from the base of the skull through the vertex without intravenous contrast. COMPARISON:  None. FINDINGS: Brain: There is no mass, hemorrhage or extra-axial collection. The size and configuration of the ventricles and extra-axial CSF spaces are normal. The brain parenchyma is normal, without acute or chronic infarction. Vascular: No abnormal hyperdensity of the major intracranial arteries or dural venous sinuses. No intracranial atherosclerosis. Skull: The visualized skull base, calvarium and  extracranial soft tissues are normal. Sinuses/Orbits: No fluid levels or advanced mucosal thickening of the visualized paranasal sinuses. No mastoid or middle ear effusion. The orbits are normal. IMPRESSION: Normal head CT. Electronically Signed   By: Ulyses Jarred M.D.   On: 07/14/2019 22:56   CT HIP RIGHT WO CONTRAST  Result Date: 07/15/2019 CLINICAL DATA:  Right hip pain after fall EXAM: CT OF THE RIGHT HIP WITHOUT CONTRAST TECHNIQUE: Multidetector CT imaging of the right hip was performed according to the standard protocol. Multiplanar CT image reconstructions were also generated. COMPARISON:  None. FINDINGS: Bones/Joint/Cartilage There is a transverse fracture of the right femoral neck with mild posterior angulation. There is foreshortening of the neck. No acetabular fracture. No dislocation of the femoral head. Ligaments Suboptimally assessed by CT. Muscles and Tendons No  intramuscular hematoma. Soft tissues Unremarkable IMPRESSION: Transverse fracture of the right femoral neck with mild posterior angulation and foreshortening of the neck. Electronically Signed   By: Ulyses Jarred M.D.   On: 07/15/2019 00:20   DG Hip Unilat W or Wo Pelvis 2-3 Views Right  Result Date: 07/14/2019 CLINICAL DATA:  [place], right hip pain EXAM: DG HIP (WITH OR WITHOUT PELVIS) 2-3V RIGHT COMPARISON:  None. FINDINGS: Frontal view of the pelvis as well as frontal and cross-table lateral views of the right hip are obtained. There is an impacted basicervical right hip fracture with varus angulation. No dislocation. The remainder of the bony pelvis is unremarkable. Sacroiliac joints are normal. IMPRESSION: 1. Impacted basicervical right hip fracture with varus angulation. Electronically Signed   By: Randa Ngo M.D.   On: 07/14/2019 22:52    Positive ROS: All other systems have been reviewed and were otherwise negative with the exception of those mentioned in the HPI and as above.  Physical Exam: General: Alert, no acute  distress Cardiovascular: No pedal edema Respiratory: No cyanosis, no use of accessory musculature GI: No organomegaly, abdomen is soft and non-tender Skin: No lesions in the area of chief complaint Neurologic: Sensation intact distally Psychiatric: Patient is competent for consent with normal mood and affect Lymphatic: No axillary or cervical lymphadenopathy  MUSCULOSKELETAL: The patient is alert and alert fully oriented.  She is eating a sandwich.  Her daughter and grandson are in attendance.  The right leg has pain with range of motion.  Neurovascular status good distally.  Skin is intact.  No other injuries are noted.  Buck's traction is in place.  Assessment: Alisha Winters angulated subcapital fracture right hip  Plan: Percutaneous pinning right hip tomorrow    Park Breed, MD [phone]   07/15/2019 2:48 PM

## 2019-07-15 NOTE — Progress Notes (Addendum)
Patient ID: Alisha Winters, female   DOB: 06-Jul-1932, 86 y.o.   MRN: [id number] This is a no charge progress note as patient was admitted this a.m. H&P reviewed patient personally seen and examined Mazzy Santarelli is a 86 y.o. female with a known history of afib on Coumadin,  HTN, CAD, DM, CVA, anemia presents to the emergency department for evaluation of right hip pain.. Patient was in a usual state of health until earlier today when she had an episode of vertigo and sustained a mechanical fall with head trauma but no LOC.  She comes to the ER with complaint of right hip pain, unable to move, stand or walk.   Patient has no complaints of pain this a.m. nad cta no w/r/r irreg-reg Soft nt/nd  A/P: Cards input was appreciated-Proceed to orthopedic surgery without restriction at this time due to no current  -Continue metoprolol Continue to hold Coumadin Continue heparin sq for now, resume a/c  when appropriate after surgery Plan for surgery in am.

## 2019-07-15 NOTE — Progress Notes (Signed)
PT Cancellation Note  Patient Details Name: DEMAYA HARDGE MRN: [id number] DOB: 1932-09-23   Cancelled Treatment:    Reason Eval/Treat Not Completed: Medical issues which prohibited therapy (Consult received and chart reviewed. Patient noted with acute R hip fracture; pending surgical correction at this time.  Will complete current order and maintain bedrest until procedure complete and patient cleared for activity.  Per guidelines, will require new order to initiate PT service post-op.  Please re-consult as medically appropriate.)   Jessi Jessop H. Owens Shark, PT, DPT, NCS 07/15/19, 9:06 AM [phone]

## 2019-07-15 NOTE — Progress Notes (Signed)
Patient has no prn PO pain meds, Dr Sabra Heck notified and orders placed.

## 2019-07-16 ENCOUNTER — Inpatient Hospital Stay: Payer: Medicare Other | Admitting: Anesthesiology

## 2019-07-16 ENCOUNTER — Encounter
Admission: EM | Disposition: A | Discharge status: Skilled Nursing Facility | Payer: Self-pay | Source: Home / Self Care | Attending: Internal Medicine

## 2019-07-16 ENCOUNTER — Inpatient Hospital Stay: Payer: Medicare Other

## 2019-07-16 DIAGNOSIS — E118 Type 2 diabetes mellitus with unspecified complications: Secondary | ICD-10-CM

## 2019-07-16 DIAGNOSIS — I4891 Unspecified atrial fibrillation: Secondary | ICD-10-CM

## 2019-07-16 HISTORY — PX: HIP PINNING,CANNULATED: SHX1758

## 2019-07-16 LAB — CBC
HCT: 41.4 % (ref 36.0–46.0)
HCT: 40.7 % (ref 36.0–46.0)
Hemoglobin: 13.1 g/dL (ref 12.0–15.0)
Hemoglobin: 12.8 g/dL (ref 12.0–15.0)
MCH: 26.6 pg (ref 26.0–34.0)
MCH: 26.6 pg (ref 26.0–34.0)
MCHC: 31.6 g/dL (ref 30.0–36.0)
MCHC: 31.4 g/dL (ref 30.0–36.0)
MCV: 84.1 fL (ref 80.0–100.0)
MCV: 84.4 fL (ref 80.0–100.0)
Platelets: 235 10*3/uL (ref 150–400)
Platelets: 217 10*3/uL (ref 150–400)
RBC: 4.92 MIL/uL (ref 3.87–5.11)
RBC: 4.82 MIL/uL (ref 3.87–5.11)
RDW: 16.3 % — ABNORMAL HIGH (ref 11.5–15.5)
RDW: 16 % — ABNORMAL HIGH (ref 11.5–15.5)
WBC: 9.7 10*3/uL (ref 4.0–10.5)
WBC: 12.4 10*3/uL — ABNORMAL HIGH (ref 4.0–10.5)
nRBC: 0 % (ref 0.0–0.2)
nRBC: 0 % (ref 0.0–0.2)

## 2019-07-16 LAB — PROTIME-INR
INR: 1.3 — ABNORMAL HIGH (ref 0.8–1.2)
INR: 1.3 — ABNORMAL HIGH (ref 0.8–1.2)
Prothrombin Time: 15.5 seconds — ABNORMAL HIGH (ref 11.4–15.2)
Prothrombin Time: 16 seconds — ABNORMAL HIGH (ref 11.4–15.2)

## 2019-07-16 LAB — GLUCOSE, CAPILLARY
Glucose-Capillary: 152 mg/dL — ABNORMAL HIGH (ref 70–99)
Glucose-Capillary: 178 mg/dL — ABNORMAL HIGH (ref 70–99)
Glucose-Capillary: 223 mg/dL — ABNORMAL HIGH (ref 70–99)
Glucose-Capillary: 368 mg/dL — ABNORMAL HIGH (ref 70–99)
Glucose-Capillary: 311 mg/dL — ABNORMAL HIGH (ref 70–99)
Glucose-Capillary: 314 mg/dL — ABNORMAL HIGH (ref 70–99)

## 2019-07-16 LAB — BASIC METABOLIC PANEL
Anion gap: 4 — ABNORMAL LOW (ref 5–15)
BUN: 17 mg/dL (ref 8–23)
CO2: 28 mmol/L (ref 22–32)
Calcium: 8.1 mg/dL — ABNORMAL LOW (ref 8.9–10.3)
Chloride: 107 mmol/L (ref 98–111)
Creatinine, Ser: 0.76 mg/dL (ref 0.44–1.00)
GFR calc Af Amer: >60 mL/min (ref >60)
GFR calc non Af Amer: >60 mL/min (ref >60)
Glucose, Bld: 173 mg/dL — ABNORMAL HIGH (ref 70–99)
Potassium: 4.1 mmol/L (ref 3.5–5.1)
Sodium: 139 mmol/L (ref 135–145)

## 2019-07-16 LAB — CREATININE, SERUM
Creatinine, Ser: 0.82 mg/dL (ref 0.44–1.00)
GFR calc Af Amer: >60 mL/min (ref >60)
GFR calc non Af Amer: >60 mL/min (ref >60)

## 2019-07-16 SURGERY — FIXATION, FEMUR, NECK, PERCUTANEOUS, USING SCREW
Anesthesia: General | Site: Hip | Laterality: Right

## 2019-07-16 MED ORDER — LIDOCAINE HCL (CARDIAC) PF 100 MG/5ML IV SOSY
PREFILLED_SYRINGE | INTRAVENOUS | Status: DC | PRN
  Administered 2019-07-16: 100 mg via INTRAVENOUS

## 2019-07-16 MED ORDER — ALBUTEROL SULFATE (2.5 MG/3ML) 0.083% IN NEBU: 2.5000 mg | INHALATION_SOLUTION | Freq: Four times a day (QID) | RESPIRATORY_TRACT | Status: DC | PRN

## 2019-07-16 MED ORDER — OXYCODONE HCL 5 MG PO TABS: 5.0000 mg | ORAL_TABLET | Freq: Once | ORAL | Status: DC | PRN

## 2019-07-16 MED ORDER — ACETAMINOPHEN 500 MG PO TABS
500.0000 mg | ORAL_TABLET | Freq: Every day | ORAL | Status: DC
  Administered 2019-07-16 – 2019-07-18 (×3): 500 mg via ORAL
  Filled 2019-07-16 (×3): qty 1

## 2019-07-16 MED ORDER — CLINDAMYCIN PHOSPHATE 600 MG/50ML IV SOLN
INTRAVENOUS | Status: DC | PRN
Start: 2019-07-16 — End: 2019-07-16
  Administered 2019-07-16: 600 mg via INTRAVENOUS

## 2019-07-16 MED ORDER — FERROUS SULFATE 325 (65 FE) MG PO TABS
325.0000 mg | ORAL_TABLET | Freq: Every day | ORAL | Status: DC
  Administered 2019-07-17 – 2019-07-19 (×3): 325 mg via ORAL
  Filled 2019-07-16 (×3): qty 1

## 2019-07-16 MED ORDER — WARFARIN SODIUM 5 MG PO TABS: 5.0000 mg | ORAL_TABLET | Freq: Every day | ORAL | Status: DC

## 2019-07-16 MED ORDER — ONDANSETRON HCL 4 MG PO TABS: 4.0000 mg | ORAL_TABLET | Freq: Four times a day (QID) | ORAL | Status: DC | PRN

## 2019-07-16 MED ORDER — OXYCODONE HCL 5 MG/5ML PO SOLN: 5.0000 mg | Freq: Once | ORAL | Status: DC | PRN

## 2019-07-16 MED ORDER — ALUM & MAG HYDROXIDE-SIMETH 200-200-20 MG/5ML PO SUSP: 30.0000 mL | ORAL | Status: DC | PRN

## 2019-07-16 MED ORDER — METOCLOPRAMIDE HCL 5 MG/ML IJ SOLN: 5.0000 mg | Freq: Three times a day (TID) | INTRAMUSCULAR | Status: DC | PRN

## 2019-07-16 MED ORDER — ROCURONIUM BROMIDE 100 MG/10ML IV SOLN
INTRAVENOUS | Status: DC | PRN
  Administered 2019-07-16: 50 mg via INTRAVENOUS

## 2019-07-16 MED ORDER — PROPOFOL 10 MG/ML IV BOLUS
INTRAVENOUS | Status: DC | PRN
  Administered 2019-07-16: 120 mg via INTRAVENOUS

## 2019-07-16 MED ORDER — SENNA 8.6 MG PO TABS
1.0000 | ORAL_TABLET | Freq: Two times a day (BID) | ORAL | Status: DC
  Administered 2019-07-16 – 2019-07-19 (×6): 8.6 mg via ORAL
  Filled 2019-07-16 (×6): qty 1

## 2019-07-16 MED ORDER — LIDOCAINE HCL (PF) 2 % IJ SOLN
INTRAMUSCULAR | Status: AC
  Filled 2019-07-16: qty 5

## 2019-07-16 MED ORDER — ONDANSETRON HCL 4 MG/2ML IJ SOLN
INTRAMUSCULAR | Status: DC | PRN
  Administered 2019-07-16: 4 mg via INTRAVENOUS

## 2019-07-16 MED ORDER — ONDANSETRON HCL 4 MG/2ML IJ SOLN: 4.0000 mg | Freq: Four times a day (QID) | INTRAMUSCULAR | Status: DC | PRN

## 2019-07-16 MED ORDER — EPHEDRINE SULFATE 50 MG/ML IJ SOLN
INTRAMUSCULAR | Status: DC | PRN
  Administered 2019-07-16 (×2): 10 mg via INTRAVENOUS

## 2019-07-16 MED ORDER — PHENOL 1.4 % MT LIQD
1.0000 | OROMUCOSAL | Status: DC | PRN
  Filled 2019-07-16: qty 177

## 2019-07-16 MED ORDER — SODIUM CHLORIDE 0.45 % IV SOLN: INTRAVENOUS | Status: DC

## 2019-07-16 MED ORDER — HYDROCODONE-ACETAMINOPHEN 5-325 MG PO TABS
1.0000 | ORAL_TABLET | Freq: Four times a day (QID) | ORAL | Status: DC | PRN
  Administered 2019-07-17 – 2019-07-19 (×4): 1 via ORAL
  Filled 2019-07-16 (×3): qty 1

## 2019-07-16 MED ORDER — FENTANYL CITRATE (PF) 100 MCG/2ML IJ SOLN
INTRAMUSCULAR | Status: DC | PRN
  Administered 2019-07-16: 50 ug via INTRAVENOUS

## 2019-07-16 MED ORDER — FENTANYL CITRATE (PF) 100 MCG/2ML IJ SOLN
INTRAMUSCULAR | Status: AC
  Filled 2019-07-16: qty 2

## 2019-07-16 MED ORDER — HYDROCODONE-ACETAMINOPHEN 7.5-325 MG PO TABS: 1.0000 | ORAL_TABLET | Freq: Four times a day (QID) | ORAL | Status: DC | PRN

## 2019-07-16 MED ORDER — ROCURONIUM BROMIDE 10 MG/ML (PF) SYRINGE
PREFILLED_SYRINGE | INTRAVENOUS | Status: AC
  Filled 2019-07-16: qty 10

## 2019-07-16 MED ORDER — ACETAMINOPHEN 10 MG/ML IV SOLN
INTRAVENOUS | Status: AC
  Filled 2019-07-16: qty 100

## 2019-07-16 MED ORDER — ACETAMINOPHEN 10 MG/ML IV SOLN
INTRAVENOUS | Status: DC | PRN
  Administered 2019-07-16: 1000 mg via INTRAVENOUS

## 2019-07-16 MED ORDER — FENTANYL CITRATE (PF) 100 MCG/2ML IJ SOLN: 25.0000 ug | INTRAMUSCULAR | Status: DC | PRN

## 2019-07-16 MED ORDER — DEXAMETHASONE SODIUM PHOSPHATE 10 MG/ML IJ SOLN
INTRAMUSCULAR | Status: DC | PRN
  Administered 2019-07-16: 5 mg via INTRAVENOUS

## 2019-07-16 MED ORDER — FUROSEMIDE 40 MG PO TABS
40.0000 mg | ORAL_TABLET | Freq: Every day | ORAL | Status: DC
  Administered 2019-07-17 – 2019-07-19 (×3): 40 mg via ORAL
  Filled 2019-07-16 (×3): qty 1

## 2019-07-16 MED ORDER — MENTHOL 3 MG MT LOZG
1.0000 | LOZENGE | OROMUCOSAL | Status: DC | PRN
  Filled 2019-07-16: qty 9

## 2019-07-16 MED ORDER — CEFAZOLIN SODIUM-DEXTROSE 2-4 GM/100ML-% IV SOLN
INTRAVENOUS | Status: AC
  Filled 2019-07-16: qty 100

## 2019-07-16 MED ORDER — MEPERIDINE HCL 50 MG/ML IJ SOLN: 6.2500 mg | INTRAMUSCULAR | Status: DC | PRN

## 2019-07-16 MED ORDER — CLINDAMYCIN PHOSPHATE 600 MG/50ML IV SOLN
600.0000 mg | Freq: Three times a day (TID) | INTRAVENOUS | Status: AC
  Administered 2019-07-16 – 2019-07-17 (×3): 600 mg via INTRAVENOUS
  Filled 2019-07-16 (×4): qty 50

## 2019-07-16 MED ORDER — PROMETHAZINE HCL 25 MG/ML IJ SOLN: 6.2500 mg | INTRAMUSCULAR | Status: DC | PRN

## 2019-07-16 MED ORDER — ZOLPIDEM TARTRATE 5 MG PO TABS: 5.0000 mg | ORAL_TABLET | Freq: Every evening | ORAL | Status: DC | PRN

## 2019-07-16 MED ORDER — BUPIVACAINE-EPINEPHRINE (PF) 0.25% -1:200000 IJ SOLN
INTRAMUSCULAR | Status: AC
  Filled 2019-07-16: qty 30

## 2019-07-16 MED ORDER — WARFARIN SODIUM 5 MG PO TABS
5.0000 mg | ORAL_TABLET | Freq: Every day | ORAL | Status: DC
  Administered 2019-07-16: 5 mg via ORAL
  Filled 2019-07-16 (×2): qty 1

## 2019-07-16 MED ORDER — PROPOFOL 10 MG/ML IV BOLUS
INTRAVENOUS | Status: AC
  Filled 2019-07-16: qty 20

## 2019-07-16 MED ORDER — ONDANSETRON HCL 4 MG/2ML IJ SOLN
INTRAMUSCULAR | Status: AC
  Filled 2019-07-16: qty 2

## 2019-07-16 MED ORDER — FLEET ENEMA 7-19 GM/118ML RE ENEM
1.0000 | ENEMA | Freq: Once | RECTAL | Status: AC | PRN
  Administered 2019-07-18: 1 via RECTAL

## 2019-07-16 MED ORDER — ACETAMINOPHEN 325 MG PO TABS: 325.0000 mg | ORAL_TABLET | Freq: Four times a day (QID) | ORAL | Status: DC | PRN

## 2019-07-16 MED ORDER — WARFARIN - PHYSICIAN DOSING INPATIENT: Freq: Every day | Status: DC

## 2019-07-16 MED ORDER — ENOXAPARIN SODIUM 40 MG/0.4ML ~~LOC~~ SOLN
40.0000 mg | SUBCUTANEOUS | Status: DC
  Administered 2019-07-17 – 2019-07-19 (×3): 40 mg via SUBCUTANEOUS
  Filled 2019-07-16 (×3): qty 0.4

## 2019-07-16 MED ORDER — BUPIVACAINE-EPINEPHRINE (PF) 0.5% -1:200000 IJ SOLN
INTRAMUSCULAR | Status: AC
  Filled 2019-07-16: qty 30

## 2019-07-16 MED ORDER — EPHEDRINE 5 MG/ML INJ
INTRAVENOUS | Status: AC
  Filled 2019-07-16: qty 10

## 2019-07-16 MED ORDER — METOCLOPRAMIDE HCL 10 MG PO TABS: 5.0000 mg | ORAL_TABLET | Freq: Three times a day (TID) | ORAL | Status: DC | PRN

## 2019-07-16 MED ORDER — MAGNESIUM HYDROXIDE 400 MG/5ML PO SUSP
30.0000 mL | Freq: Every day | ORAL | Status: DC | PRN
  Administered 2019-07-18: 30 mL via ORAL
  Filled 2019-07-16: qty 30

## 2019-07-16 MED ORDER — SUGAMMADEX SODIUM 500 MG/5ML IV SOLN
INTRAVENOUS | Status: DC | PRN
  Administered 2019-07-16: 250 mg via INTRAVENOUS

## 2019-07-16 MED ORDER — CEFAZOLIN SODIUM-DEXTROSE 2-3 GM-%(50ML) IV SOLR
INTRAVENOUS | Status: DC | PRN
  Administered 2019-07-16: 2 g via INTRAVENOUS

## 2019-07-16 MED ORDER — CLINDAMYCIN PHOSPHATE 600 MG/50ML IV SOLN
INTRAVENOUS | Status: AC
  Filled 2019-07-16: qty 50

## 2019-07-16 MED ORDER — ALBUTEROL SULFATE HFA 108 (90 BASE) MCG/ACT IN AERS: 2.0000 | INHALATION_SPRAY | Freq: Four times a day (QID) | RESPIRATORY_TRACT | Status: DC | PRN

## 2019-07-16 MED ORDER — DEXAMETHASONE SODIUM PHOSPHATE 10 MG/ML IJ SOLN
INTRAMUSCULAR | Status: AC
  Filled 2019-07-16: qty 1

## 2019-07-16 MED ORDER — BISACODYL 10 MG RE SUPP
10.0000 mg | Freq: Every day | RECTAL | Status: DC | PRN
  Administered 2019-07-18: 10 mg via RECTAL
  Filled 2019-07-16: qty 1

## 2019-07-16 MED ORDER — PHENYLEPHRINE HCL (PRESSORS) 10 MG/ML IV SOLN
INTRAVENOUS | Status: AC
  Filled 2019-07-16: qty 1

## 2019-07-16 MED ORDER — MORPHINE SULFATE (PF) 2 MG/ML IV SOLN: 1.0000 mg | INTRAVENOUS | Status: DC | PRN

## 2019-07-16 MED ORDER — CEFAZOLIN SODIUM-DEXTROSE 2-4 GM/100ML-% IV SOLN
2.0000 g | Freq: Three times a day (TID) | INTRAVENOUS | Status: AC
  Administered 2019-07-16 – 2019-07-17 (×3): 2 g via INTRAVENOUS
  Filled 2019-07-16 (×3): qty 100

## 2019-07-16 MED ORDER — BUPIVACAINE-EPINEPHRINE (PF) 0.5% -1:200000 IJ SOLN
INTRAMUSCULAR | Status: DC | PRN
  Administered 2019-07-16: 30 mL

## 2019-07-16 MED ORDER — PHENYLEPHRINE HCL (PRESSORS) 10 MG/ML IV SOLN
INTRAVENOUS | Status: DC | PRN
  Administered 2019-07-16 (×2): 100 ug via INTRAVENOUS

## 2019-07-16 SURGICAL SUPPLY — 26 items
BLADE SURG SZ11 CARB STEEL (BLADE) ×3 IMPLANT
BNDG COHESIVE 4X5 TAN STRL (GAUZE/BANDAGES/DRESSINGS) ×3 IMPLANT
CHLORAPREP W/TINT 26 (MISCELLANEOUS) ×3 IMPLANT
COVER WAND RF STERILE (DRAPES) ×3 IMPLANT
DRSG AQUACEL AG ADV 3.5X10 (GAUZE/BANDAGES/DRESSINGS) ×3 IMPLANT
GAUZE SPONGE 4X4 12PLY STRL (GAUZE/BANDAGES/DRESSINGS) ×3 IMPLANT
GLOVE BIO SURGEON STRL SZ8 (GLOVE) ×3 IMPLANT
GLOVE SURG ORTHO 8.5 STRL (GLOVE) ×3 IMPLANT
GLOVE SURG XRAY 8.5 LX (GLOVE) ×1 IMPLANT
GOWN STRL REUS W/ TWL LRG LVL3 (GOWN DISPOSABLE) ×1 IMPLANT
GOWN STRL REUS W/TWL LRG LVL3 (GOWN DISPOSABLE) ×2
GOWN STRL REUS W/TWL LRG LVL4 (GOWN DISPOSABLE) ×3 IMPLANT
KIT TURNOVER KIT A (KITS) ×3 IMPLANT
MAT ABSORB  FLUID 56X50 GRAY (MISCELLANEOUS) ×2
MAT ABSORB FLUID 56X50 GRAY (MISCELLANEOUS) ×1 IMPLANT
NDL SPNL 18GX3.5 QUINCKE PK (NEEDLE) ×1 IMPLANT
NEEDLE SPNL 18GX3.5 QUINCKE PK (NEEDLE) ×3 IMPLANT
NS IRRIG 500ML POUR BTL (IV SOLUTION) ×3 IMPLANT
PACK HIP COMPR (MISCELLANEOUS) ×3 IMPLANT
SCREW CANN TI 32 THRD 6.5X100 (Screw) ×2 IMPLANT
SCREW CANN TI 32 THRD 6.5X105 (Screw) ×2 IMPLANT
SCREW CANN TI 32 THRD 6.5X95 (Screw) ×4 IMPLANT
STRAP SAFETY 5IN WIDE (MISCELLANEOUS) ×3 IMPLANT
SUT ETHILON 3 0 FSLX (SUTURE) ×3 IMPLANT
SWABSTK COMLB BENZOIN TINCTURE (MISCELLANEOUS) ×2 IMPLANT
SYR 30ML LL (SYRINGE) ×3 IMPLANT

## 2019-07-16 NOTE — Progress Notes (Signed)
Pts BP elevated, (was NPO for surgery this morning)   MD Amery notified. Per MD Give morning dose Lisinopril only, not Imdur or Norvasc.

## 2019-07-16 NOTE — Anesthesia Procedure Notes (Signed)
Procedure Name: Intubation Date/Time: 07/16/2019 9:54 AM Performed by: Aline Brochure, CRNA Pre-anesthesia Checklist: Patient identified, Emergency Drugs available, Suction available and Patient being monitored Patient Re-evaluated:Patient Re-evaluated prior to induction Oxygen Delivery Method: Circle system utilized Preoxygenation: Pre-oxygenation with 100% oxygen Induction Type: IV induction Ventilation: Mask ventilation without difficulty Laryngoscope Size: McGraph and 3 Grade View: Grade I Tube type: Oral Tube size: 7.0 mm Number of attempts: 1 Airway Equipment and Method: Stylet and Video-laryngoscopy Placement Confirmation: ETT inserted through vocal cords under direct vision,  positive ETCO2 and breath sounds checked- equal and bilateral Secured at: 21 cm Tube secured with: Tape Dental Injury: Teeth and Oropharynx as per pre-operative assessment

## 2019-07-16 NOTE — Progress Notes (Signed)
Patient refused to be dangled due to extremene pain with movement. Patient reports a pain level of 0 without movement.

## 2019-07-16 NOTE — Progress Notes (Signed)
[facility] Encounter Note  Patient: Alisha Winters / Admit Date: 07/14/2019 / Date of Encounter: 07/16/2019, 6:56 AM   Subjective: Patient overall feels relatively well other than right hip pain.  No evidence of chest pain anginal symptoms and or acute coronary syndrome or congestive heart failure.  Atrial fibrillation with controlled ventricular rate which is chronic and hypertension well controlled with lisinopril. Echocardiogram showing low normal LV systolic function with moderate valvular heart disease unchanged from before.  Review of Systems: Positive for: Hip pain Negative for: Vision change, hearing change, syncope, dizziness, nausea, vomiting,diarrhea, bloody stool, stomach pain, cough, congestion, diaphoresis, urinary frequency, urinary pain,skin lesions, skin rashes Others previously listed  Objective: Telemetry: Atrial fibrillation with controlled ventricular rate Physical Exam: Blood pressure (!) 163/86, pulse 72, temperature 99.4 F (37.4 C), temperature source Oral, resp. rate 18, height 6' (1.829 m), weight 117.9 kg, SpO2 95 %. Body mass index is 35.26 kg/m. General: Well developed, well nourished, in no acute distress. Head: Normocephalic, atraumatic, sclera non-icteric, no xanthomas, nares are without discharge. Neck: No apparent masses Lungs: Normal respirations with no wheezes, no rhonchi, no rales , no crackles   Heart: Irregular rate and rhythm, normal S1 S2, no murmur, no rub, no gallop, PMI is normal size and placement, carotid upstroke normal without bruit, jugular venous pressure normal Abdomen: Soft, non-tender, non-distended with normoactive bowel sounds. No hepatosplenomegaly. Abdominal aorta is normal size without bruit Extremities: Trace edema, no clubbing, no cyanosis, no ulcers,  Peripheral: 2+ radial, 2+ femoral, 2+ dorsal pedal pulses Neuro: Alert and oriented. Moves all extremities spontaneously. Psych:  Responds to questions  appropriately with a normal affect.   Intake/Output Summary (Last 24 hours) at 07/16/2019 0656 Last data filed at 07/16/2019 0624 Gross per 24 hour  Intake 1024.37 ml  Output 950 ml  Net 74.37 ml    Inpatient Medications:  . amLODipine  10 mg Oral Daily  . chlorhexidine  1 application Topical Once  . insulin aspart  0-15 Units Subcutaneous Q4H  . isosorbide mononitrate  30 mg Oral Daily  . lisinopril  10 mg Oral Daily  . mouth rinse  15 mL Mouth Rinse BID  . metoprolol succinate  50 mg Oral Daily   Infusions:  . sodium chloride 75 mL/hr at 07/16/19 0243    Labs: Recent Labs    07/15/19 0507 07/15/19 2255  NA 136 139  K 4.7 4.1  CL 103 107  CO2 26 28  GLUCOSE 262* 173*  BUN 19 17  CREATININE 0.75 0.76  CALCIUM 8.4* 8.1*   No results for input(s): AST, ALT, ALKPHOS, BILITOT, PROT, ALBUMIN in the last 72 hours. Recent Labs    07/14/19 2314 07/14/19 2314 07/15/19 0223 07/15/19 0507  WBC 9.4   < > 13.3* 13.4*  NEUTROABS 7.3  --   --   --   HGB 12.9   < > 13.2 13.6  HCT 39.9   < > 40.9 41.1  MCV 82.4   < > 82.6 81.4  PLT 252   < > 257 261   < > = values in this interval not displayed.   No results for input(s): CKTOTAL, CKMB, TROPONINI in the last 72 hours. Invalid input(s): POCBNP Recent Labs    07/15/19 1056  HGBA1C 8.8*     Weights: Filed Weights   07/14/19 2219  Weight: 117.9 kg     Radiology/Studies:  DG Chest 1 View  Result Date: 07/14/2019 CLINICAL DATA:  Fall with right  hip pain EXAM: CHEST  1 VIEW COMPARISON:  None. FINDINGS: Right hemidiaphragm is elevated. There is bibasilar atelectasis. No focal consolidation pulmonary edema. Normal cardiomediastinal contours. No pleural effusion. IMPRESSION: Right hemidiaphragm elevation with bibasilar atelectasis. Electronically Signed   By: Ulyses Jarred M.D.   On: 07/14/2019 22:51   CT HEAD WO CONTRAST  Result Date: 07/14/2019 CLINICAL DATA:  Fall EXAM: CT HEAD WITHOUT CONTRAST TECHNIQUE: Contiguous  axial images were obtained from the base of the skull through the vertex without intravenous contrast. COMPARISON:  None. FINDINGS: Brain: There is no mass, hemorrhage or extra-axial collection. The size and configuration of the ventricles and extra-axial CSF spaces are normal. The brain parenchyma is normal, without acute or chronic infarction. Vascular: No abnormal hyperdensity of the major intracranial arteries or dural venous sinuses. No intracranial atherosclerosis. Skull: The visualized skull base, calvarium and extracranial soft tissues are normal. Sinuses/Orbits: No fluid levels or advanced mucosal thickening of the visualized paranasal sinuses. No mastoid or middle ear effusion. The orbits are normal. IMPRESSION: Normal head CT. Electronically Signed   By: Ulyses Jarred M.D.   On: 07/14/2019 22:56   CT HIP RIGHT WO CONTRAST  Result Date: 07/15/2019 CLINICAL DATA:  Right hip pain after fall EXAM: CT OF THE RIGHT HIP WITHOUT CONTRAST TECHNIQUE: Multidetector CT imaging of the right hip was performed according to the standard protocol. Multiplanar CT image reconstructions were also generated. COMPARISON:  None. FINDINGS: Bones/Joint/Cartilage There is a transverse fracture of the right femoral neck with mild posterior angulation. There is foreshortening of the neck. No acetabular fracture. No dislocation of the femoral head. Ligaments Suboptimally assessed by CT. Muscles and Tendons No intramuscular hematoma. Soft tissues Unremarkable IMPRESSION: Transverse fracture of the right femoral neck with mild posterior angulation and foreshortening of the neck. Electronically Signed   By: Ulyses Jarred M.D.   On: 07/15/2019 00:20   ECHOCARDIOGRAM COMPLETE  Result Date: 07/15/2019    ECHOCARDIOGRAM REPORT   Patient Name:   Alisha Winters Date of Exam: 07/15/2019 Medical Rec #:  [id number]        Height:       72.0 in Accession #:    [id number]       Weight:       260.0 lb Date of Birth:  [birth year]        BSA:           2.382 m Patient Age:    5 years         BP:           155/77 mmHg Patient Gender: F                HR:           70 bpm. Exam Location:  [facility] Procedure: 2D Echo, Cardiac Doppler and Color Doppler Indications:     Pre-operative cardiovascular examination V72.81 / Z01.810  History:         Patient has prior history of Echocardiogram examinations. CAD,                  Stroke; Risk Factors:Hypertension.  Sonographer:     Alyse Low Roar Referring Phys:  [id number] Harvie Bridge Diagnosing Phys: Serafina Royals MD IMPRESSIONS  1. Left ventricular ejection fraction, by estimation, is 50 to 55%. The left ventricle has low normal function. The left ventricle has no regional wall motion abnormalities. Left ventricular diastolic function could not be evaluated.  2. Right ventricular systolic function is normal. The  right ventricular size is normal. There is severely elevated pulmonary artery systolic pressure.  3. The mitral valve is normal in structure. Mild mitral valve regurgitation.  4. Tricuspid valve regurgitation is mild to moderate.  5. The aortic valve is normal in structure. Aortic valve regurgitation is trivial. FINDINGS  Left Ventricle: Left ventricular ejection fraction, by estimation, is 50 to 55%. The left ventricle has low normal function. The left ventricle has no regional wall motion abnormalities. The left ventricular internal cavity size was normal in size. There is no left ventricular hypertrophy. Left ventricular diastolic function could not be evaluated. Right Ventricle: The right ventricular size is normal. No increase in right ventricular wall thickness. Right ventricular systolic function is normal. There is severely elevated pulmonary artery systolic pressure. The tricuspid regurgitant velocity is 3.73 m/s, and with an assumed right atrial pressure of 10 mmHg, the estimated right ventricular systolic pressure is 85.6 mmHg. Left Atrium: Left atrial size was normal in size. Right Atrium: Right  atrial size was normal in size. Pericardium: There is no evidence of pericardial effusion. Mitral Valve: The mitral valve is normal in structure. Mild mitral valve regurgitation. Tricuspid Valve: The tricuspid valve is normal in structure. Tricuspid valve regurgitation is mild to moderate. Aortic Valve: The aortic valve is normal in structure. Aortic valve regurgitation is trivial. Aortic valve mean gradient measures 5.0 mmHg. Aortic valve peak gradient measures 9.4 mmHg. Aortic valve area, by VTI measures 1.68 cm. Pulmonic Valve: The pulmonic valve was normal in structure. Pulmonic valve regurgitation is not visualized. Aorta: The aortic root and ascending aorta are structurally normal, with no evidence of dilitation. IAS/Shunts: No atrial level shunt detected by color flow Doppler.  LEFT VENTRICLE PLAX 2D LVIDd:         4.49 cm  Diastology LVIDs:         3.32 cm  LV e' lateral:   11.60 cm/s LV PW:         1.16 cm  LV E/e' lateral: 8.6 LV IVS:        1.16 cm  LV e' medial:    6.96 cm/s LVOT diam:     1.90 cm  LV E/e' medial:  14.4 LV SV:         59 LV SV Index:   25 LVOT Area:     2.84 cm  RIGHT VENTRICLE RV S prime:     12.50 cm/s TAPSE (M-mode): 1.9 cm LEFT ATRIUM             Index       RIGHT ATRIUM           Index LA Vol (A2C):   69.9 ml 29.35 ml/m RA Area:     16.70 cm LA Vol (A4C):   66.1 ml 27.75 ml/m RA Volume:   40.40 ml  16.96 ml/m LA Biplane Vol: 68.4 ml 28.72 ml/m  AORTIC VALVE                    PULMONIC VALVE AV Area (Vmax):    1.66 cm     PV Vmax:        1.14 m/s AV Area (Vmean):   1.64 cm     PV Peak grad:   5.2 mmHg AV Area (VTI):     1.68 cm     RVOT Peak grad: 2 mmHg AV Vmax:           153.00 cm/s AV Vmean:  103.000 cm/s AV VTI:            0.350 m AV Peak Grad:      9.4 mmHg AV Mean Grad:      5.0 mmHg LVOT Vmax:         89.40 cm/s LVOT Vmean:        59.500 cm/s LVOT VTI:          0.207 m LVOT/AV VTI ratio: 0.59  AORTA Ao Root diam: 3.20 cm MITRAL VALVE                TRICUSPID  VALVE MV Area (PHT): 3.77 cm     TR Peak grad:   55.7 mmHg MV Decel Time: 201 msec     TR Vmax:        373.00 cm/s MV E velocity: 100.00 cm/s                             SHUNTS                             Systemic VTI:  0.21 m                             Systemic Diam: 1.90 cm Serafina Royals MD Electronically signed by Serafina Royals MD Signature Date/Time: 07/15/2019/7:16:13 PM    Final    DG Hip Unilat W or Wo Pelvis 2-3 Views Right  Result Date: 07/14/2019 CLINICAL DATA:  [place], right hip pain EXAM: DG HIP (WITH OR WITHOUT PELVIS) 2-3V RIGHT COMPARISON:  None. FINDINGS: Frontal view of the pelvis as well as frontal and cross-table lateral views of the right hip are obtained. There is an impacted basicervical right hip fracture with varus angulation. No dislocation. The remainder of the bony pelvis is unremarkable. Sacroiliac joints are normal. IMPRESSION: 1. Impacted basicervical right hip fracture with varus angulation. Electronically Signed   By: Randa Ngo M.D.   On: 07/14/2019 22:52     Assessment and Recommendation  86 y.o. female with known chronic nonvalvular atrial fibrillation hypertension borderline diabetes coronary atherosclerosis with right hip fracture after fall and no current evidence of acute coronary syndrome and or congestive heart failure at lowest risk possible for cardiovascular complication with orthopedic surgery 1.  Proceed to orthopedic surgery when able for further treatment of right hip fracture without restriction 2.  Continue low-dose metoprolol for heart rate control of chronic nonvalvular atrial fibrillation 3.  Hypertension control with lisinopril 4.  No further cardiac diagnostics and/or intervention necessary at this time 5.  Reinstatement of anticoagulation after surgery when appropriate for further risk reduction in stroke with atrial fibrillation 6.  Further rehabilitation after above  Signed, Serafina Royals M.D. FACC

## 2019-07-16 NOTE — Progress Notes (Signed)
PROGRESS NOTE    Alisha Winters  IWO:[id number] DOB: 08-Feb-1932 DOA: 07/19/2019 PCP: Derinda Late, MD    Brief Narrative:  BarbaraSteeleis a 86 y.o.femalewith a known history of afib on Coumadin, HTN, CAD, DM, CVA, anemiapresents to the emergency department for evaluation of right hip pain.. Patient was in a usual state of health until earlier today when she had an episode of vertigo and sustained a mechanical fall with head trauma but no LOC. She comes to the ER with complaint of right hip pain, unable to move, stand or walk.    Consultants:   Cardiology, orthopedics  Procedures:   Antimicrobials:      Subjective: Pain control.  Post surgery.  No nausea or vomiting, chest pain or shortness of breath  Objective: Vitals:   07/15/19 1711 07/15/19 2019 07/15/19 2309 07/16/19 0743  BP: (!) 156/92 (!) 157/68 (!) 163/86 (!) 169/95  Pulse: 74 68 72 69  Resp: 17 18 18 18   Temp: (!) 97.5 F (36.4 C) 98.5 F (36.9 C) 99.4 F (37.4 C) 99.2 F (37.3 C)  TempSrc: Oral Oral Oral Oral  SpO2: 97% 96% 95% 94%  Weight:      Height:        Intake/Output Summary (Last 24 hours) at 07/16/2019 0916 Last data filed at 07/16/2019 0624 Gross per 24 hour  Intake 669.14 ml  Output 950 ml  Net -280.86 ml   Filed Weights   July 19, 2019 2219  Weight: 117.9 kg    Examination:  General exam: Appears calm and comfortable  Respiratory system: Clear to auscultation. Respiratory effort normal. Cardiovascular system: S1 & S2 heard, RRR. No JVD, murmurs, rubs, gallops or clicks.  Gastrointestinal system: Abdomen is nondistended, soft and nontender.Normal bowel sounds heard. Central nervous system: Alert and oriented.  Grossly intact Extremities: No edema, right lower extremity stabilized Skin: Warm dry Psychiatry: Judgement and insight appear normal. Mood & affect appropriate.     Data Reviewed: I have personally reviewed following labs and imaging studies  CBC: Recent Labs    Lab 07/19/2019 2314 07/15/19 0223 07/15/19 0507 07/16/19 0723  WBC 9.4 13.3* 13.4* 9.7  NEUTROABS 7.3  --   --   --   HGB 12.9 13.2 13.6 13.1  HCT 39.9 40.9 41.1 41.4  MCV 82.4 82.6 81.4 84.1  PLT 252 257 261 500   Basic Metabolic Panel: Recent Labs  Lab 07/19/19 2314 07/15/19 0223 07/15/19 0507 07/15/19 2255  NA 135  --  136 139  K 4.0  --  4.7 4.1  CL 102  --  103 107  CO2 24  --  26 28  GLUCOSE 258*  --  262* 173*  BUN 22  --  19 17  CREATININE 0.89 0.75 0.75 0.76  CALCIUM 8.3*  --  8.4* 8.1*   GFR: Estimated Creatinine Clearance: 73.9 mL/min (by C-G formula based on SCr of 0.76 mg/dL). Liver Function Tests: No results for input(s): AST, ALT, ALKPHOS, BILITOT, PROT, ALBUMIN in the last 168 hours. No results for input(s): LIPASE, AMYLASE in the last 168 hours. No results for input(s): AMMONIA in the last 168 hours. Coagulation Profile: Recent Labs  Lab Jul 19, 2019 2314 07/15/19 0507 07/15/19 1056 07/15/19 2255 07/16/19 0723  INR 2.4* 2.0* 1.7* 1.3* 1.3*   Cardiac Enzymes: No results for input(s): CKTOTAL, CKMB, CKMBINDEX, TROPONINI in the last 168 hours. BNP (last 3 results) No results for input(s): PROBNP in the last 8760 hours. HbA1C: Recent Labs    07/15/19 1056  HGBA1C 8.8*   CBG: Recent Labs  Lab 07/15/19 1712 07/15/19 2016 07/15/19 2307 07/16/19 0350 07/16/19 0819  GLUCAP 213* 163* 155* 152* 178*   Lipid Profile: No results for input(s): CHOL, HDL, LDLCALC, TRIG, CHOLHDL, LDLDIRECT in the last 72 hours. Thyroid Function Tests: No results for input(s): TSH, T4TOTAL, FREET4, T3FREE, THYROIDAB in the last 72 hours. Anemia Panel: No results for input(s): VITAMINB12, FOLATE, FERRITIN, TIBC, IRON, RETICCTPCT in the last 72 hours. Sepsis Labs: No results for input(s): PROCALCITON, LATICACIDVEN in the last 168 hours.  Recent Results (from the past 240 hour(s))  SARS Coronavirus 2 by RT PCR (hospital order, performed in [facility] hospital lab)  Nasopharyngeal Nasopharyngeal Swab     Status: None   Collection Time: 07/14/19 11:14 PM   Specimen: Nasopharyngeal Swab  Result Value Ref Range Status   SARS Coronavirus 2 NEGATIVE NEGATIVE Final    Comment: (NOTE) SARS-CoV-2 target nucleic acids are NOT DETECTED.  The SARS-CoV-2 RNA is generally detectable in upper and lower respiratory specimens during the acute phase of infection. The lowest concentration of SARS-CoV-2 viral copies this assay can detect is 250 copies / mL. A negative result does not preclude SARS-CoV-2 infection and should not be used as the sole basis for treatment or other patient management decisions.  A negative result may occur with improper specimen collection / handling, submission of specimen other than nasopharyngeal swab, presence of viral mutation(s) within the areas targeted by this assay, and inadequate number of viral copies (<250 copies / mL). A negative result must be combined with clinical observations, patient history, and epidemiological information.  Fact Sheet for Patients:   StrictlyIdeas.no  Fact Sheet for Healthcare Providers: BankingDealers.co.za  This test is not yet approved or  cleared by the [place] FDA and has been authorized for detection and/or diagnosis of SARS-CoV-2 by FDA under an Emergency Use Authorization (EUA).  This EUA will remain in effect (meaning this test can be used) for the duration of the COVID-19 declaration under Section 564(b)(1) of the Act, 21 U.S.C. section 360bbb-3(b)(1), unless the authorization is terminated or revoked sooner.  Performed at [facility], [address] [place], [place] [address]   Surgical pcr screen     Status: None   Collection Time: 07/15/19  8:15 AM   Specimen: Nasal Mucosa; Nasal Swab  Result Value Ref Range Status   MRSA, PCR NEGATIVE NEGATIVE Final   Staphylococcus aureus NEGATIVE NEGATIVE Final    Comment:  (NOTE) The Xpert SA Assay (FDA approved for NASAL specimens in patients 51 years of age and older), is one component of a comprehensive surveillance program. It is not intended to diagnose infection nor to guide or monitor treatment. Performed at [facility], [address] [place], [place] [address]          Radiology Studies: DG Chest 1 View  Result Date: 07/14/2019 CLINICAL DATA:  Fall with right hip pain EXAM: CHEST  1 VIEW COMPARISON:  None. FINDINGS: Right hemidiaphragm is elevated. There is bibasilar atelectasis. No focal consolidation pulmonary edema. Normal cardiomediastinal contours. No pleural effusion. IMPRESSION: Right hemidiaphragm elevation with bibasilar atelectasis. Electronically Signed   By: Ulyses Jarred M.D.   On: 07/14/2019 22:51   CT HEAD WO CONTRAST  Result Date: 07/14/2019 CLINICAL DATA:  Fall EXAM: CT HEAD WITHOUT CONTRAST TECHNIQUE: Contiguous axial images were obtained from the base of the skull through the vertex without intravenous contrast. COMPARISON:  None. FINDINGS: Brain: There is no mass, hemorrhage  or extra-axial collection. The size and configuration of the ventricles and extra-axial CSF spaces are normal. The brain parenchyma is normal, without acute or chronic infarction. Vascular: No abnormal hyperdensity of the major intracranial arteries or dural venous sinuses. No intracranial atherosclerosis. Skull: The visualized skull base, calvarium and extracranial soft tissues are normal. Sinuses/Orbits: No fluid levels or advanced mucosal thickening of the visualized paranasal sinuses. No mastoid or middle ear effusion. The orbits are normal. IMPRESSION: Normal head CT. Electronically Signed   By: Ulyses Jarred M.D.   On: 07/14/2019 22:56   CT HIP RIGHT WO CONTRAST  Result Date: 07/15/2019 CLINICAL DATA:  Right hip pain after fall EXAM: CT OF THE RIGHT HIP WITHOUT CONTRAST TECHNIQUE: Multidetector CT imaging of the right hip was performed  according to the standard protocol. Multiplanar CT image reconstructions were also generated. COMPARISON:  None. FINDINGS: Bones/Joint/Cartilage There is a transverse fracture of the right femoral neck with mild posterior angulation. There is foreshortening of the neck. No acetabular fracture. No dislocation of the femoral head. Ligaments Suboptimally assessed by CT. Muscles and Tendons No intramuscular hematoma. Soft tissues Unremarkable IMPRESSION: Transverse fracture of the right femoral neck with mild posterior angulation and foreshortening of the neck. Electronically Signed   By: Ulyses Jarred M.D.   On: 07/15/2019 00:20   ECHOCARDIOGRAM COMPLETE  Result Date: 07/15/2019    ECHOCARDIOGRAM REPORT   Patient Name:   Alisha Winters Date of Exam: 07/15/2019 Medical Rec #:  [id number]        Height:       72.0 in Accession #:    [id number]       Weight:       260.0 lb Date of Birth:  December 12, 1932        BSA:          2.382 m Patient Age:    55 years         BP:           155/77 mmHg Patient Gender: F                HR:           70 bpm. Exam Location:  [facility] Procedure: 2D Echo, Cardiac Doppler and Color Doppler Indications:     Pre-operative cardiovascular examination V72.81 / Z01.810  History:         Patient has prior history of Echocardiogram examinations. CAD,                  Stroke; Risk Factors:Hypertension.  Sonographer:     Alyse Low Roar Referring Phys:  [id number] Harvie [place] Diagnosing Phys: Serafina Royals MD IMPRESSIONS  1. Left ventricular ejection fraction, by estimation, is 50 to 55%. The left ventricle has low normal function. The left ventricle has no regional wall motion abnormalities. Left ventricular diastolic function could not be evaluated.  2. Right ventricular systolic function is normal. The right ventricular size is normal. There is severely elevated pulmonary artery systolic pressure.  3. The mitral valve is normal in structure. Mild mitral valve regurgitation.  4. Tricuspid valve  regurgitation is mild to moderate.  5. The aortic valve is normal in structure. Aortic valve regurgitation is trivial. FINDINGS  Left Ventricle: Left ventricular ejection fraction, by estimation, is 50 to 55%. The left ventricle has low normal function. The left ventricle has no regional wall motion abnormalities. The left ventricular internal cavity size was normal in size. There is no left ventricular hypertrophy. Left ventricular diastolic  function could not be evaluated. Right Ventricle: The right ventricular size is normal. No increase in right ventricular wall thickness. Right ventricular systolic function is normal. There is severely elevated pulmonary artery systolic pressure. The tricuspid regurgitant velocity is 3.73 m/s, and with an assumed right atrial pressure of 10 mmHg, the estimated right ventricular systolic pressure is 30.1 mmHg. Left Atrium: Left atrial size was normal in size. Right Atrium: Right atrial size was normal in size. Pericardium: There is no evidence of pericardial effusion. Mitral Valve: The mitral valve is normal in structure. Mild mitral valve regurgitation. Tricuspid Valve: The tricuspid valve is normal in structure. Tricuspid valve regurgitation is mild to moderate. Aortic Valve: The aortic valve is normal in structure. Aortic valve regurgitation is trivial. Aortic valve mean gradient measures 5.0 mmHg. Aortic valve peak gradient measures 9.4 mmHg. Aortic valve area, by VTI measures 1.68 cm. Pulmonic Valve: The pulmonic valve was normal in structure. Pulmonic valve regurgitation is not visualized. Aorta: The aortic root and ascending aorta are structurally normal, with no evidence of dilitation. IAS/Shunts: No atrial level shunt detected by color flow Doppler.  LEFT VENTRICLE PLAX 2D LVIDd:         4.49 cm  Diastology LVIDs:         3.32 cm  LV e' lateral:   11.60 cm/s LV PW:         1.16 cm  LV E/e' lateral: 8.6 LV IVS:        1.16 cm  LV e' medial:    6.96 cm/s LVOT diam:      1.90 cm  LV E/e' medial:  14.4 LV SV:         59 LV SV Index:   25 LVOT Area:     2.84 cm  RIGHT VENTRICLE RV S prime:     12.50 cm/s TAPSE (M-mode): 1.9 cm LEFT ATRIUM             Index       RIGHT ATRIUM           Index LA Vol (A2C):   69.9 ml 29.35 ml/m RA Area:     16.70 cm LA Vol (A4C):   66.1 ml 27.75 ml/m RA Volume:   40.40 ml  16.96 ml/m LA Biplane Vol: 68.4 ml 28.72 ml/m  AORTIC VALVE                    PULMONIC VALVE AV Area (Vmax):    1.66 cm     PV Vmax:        1.14 m/s AV Area (Vmean):   1.64 cm     PV Peak grad:   5.2 mmHg AV Area (VTI):     1.68 cm     RVOT Peak grad: 2 mmHg AV Vmax:           153.00 cm/s AV Vmean:          103.000 cm/s AV VTI:            0.350 m AV Peak Grad:      9.4 mmHg AV Mean Grad:      5.0 mmHg LVOT Vmax:         89.40 cm/s LVOT Vmean:        59.500 cm/s LVOT VTI:          0.207 m LVOT/AV VTI ratio: 0.59  AORTA Ao Root diam: 3.20 cm MITRAL VALVE  TRICUSPID VALVE MV Area (PHT): 3.77 cm     TR Peak grad:   55.7 mmHg MV Decel Time: 201 msec     TR Vmax:        373.00 cm/s MV E velocity: 100.00 cm/s                             SHUNTS                             Systemic VTI:  0.21 m                             Systemic Diam: 1.90 cm Serafina Royals MD Electronically signed by Serafina Royals MD Signature Date/Time: 07/15/2019/7:16:13 PM    Final    DG Hip Unilat W or Wo Pelvis 2-3 Views Right  Result Date: 07/14/2019 CLINICAL DATA:  [place], right hip pain EXAM: DG HIP (WITH OR WITHOUT PELVIS) 2-3V RIGHT COMPARISON:  None. FINDINGS: Frontal view of the pelvis as well as frontal and cross-table lateral views of the right hip are obtained. There is an impacted basicervical right hip fracture with varus angulation. No dislocation. The remainder of the bony pelvis is unremarkable. Sacroiliac joints are normal. IMPRESSION: 1. Impacted basicervical right hip fracture with varus angulation. Electronically Signed   By: Randa Ngo M.D.   On: 07/14/2019 22:52         Scheduled Meds: . amLODipine  10 mg Oral Daily  . chlorhexidine  1 application Topical Once  . insulin aspart  0-15 Units Subcutaneous Q4H  . isosorbide mononitrate  30 mg Oral Daily  . lisinopril  10 mg Oral Daily  . mouth rinse  15 mL Mouth Rinse BID  . metoprolol succinate  50 mg Oral Daily   Continuous Infusions: . sodium chloride 75 mL/hr at 07/16/19 0243    Assessment & Plan:   Active Problems:   Closed intertrochanteric fracture of hip, right, initial encounter [facility])   This is a 86 y.o. female with a history of afib on Coumadin,  HTN, CAD, DM, CVA, anemia now being admitted with:  #. R intertrochanteric hip fracture Status post right femoral neck fracture repair/pinning - Pain management Cleared by cardiology for surgery Will obtain PT OT when cleared by orthopedics   #. Afib on Coumadin, rate controlled Resume anticoagulation per orthopedics recommendation - Continue metoprolol  #. History of HTN - Continue Norvasc, Imdur  #. History of CVA - Hold aspirin for OR  #. H/o Diabetes - Accuchecks q4h with RISS coverage - Hold NPH   Admission status: IP, tele mon IV Fluids: HL Diet/Nutrition: NPO Consults called: Ortho  DVT Px: Heparin, SCDs and early ambulation. Code Status: Full Code    DVT prophylaxis: SCD Code Status: Full Family Communication: None at bedside Disposition Plan: To be arranged, rehab versus home   Remains inpatient appropriate because:Ongoing diagnostic testing needed not appropriate for outpatient work up   Dispo: The patient is from: Home              Anticipated d/c is to: home vs rehab              Anticipated d/c date is: 2 days              Patient currently is not medically stable to d/c.  LOS: 1 day   Time spent: 5 minutes with more than 50% on [place], MD [facility] Pager [phone]  If 7PM-7AM, please contact night-coverage [email or web address] Password  [facility] 07/16/2019, 9:16 AM

## 2019-07-16 NOTE — Transfer of Care (Signed)
Immediate Anesthesia Transfer of Care Note  Patient: Alisha Winters  Procedure(s) Performed: CANNULATED HIP PINNING (Right Hip)  Patient Location: PACU  Anesthesia Type:General  Level of Consciousness: awake  Airway & Oxygen Therapy: Patient connected to face mask oxygen  Post-op Assessment: Post -op Vital signs reviewed and stable  Post vital signs: stable  Last Vitals:  Vitals Value Taken Time  BP 149/68 07/16/19 1113  Temp    Pulse 64 07/16/19 1115  Resp 20 07/16/19 1115  SpO2 95 % 07/16/19 1115  Vitals shown include unvalidated device data.  Last Pain:  Vitals:   07/16/19 0924  TempSrc: Temporal  PainSc: 9          Complications: No complications documented.

## 2019-07-16 NOTE — H&P (Signed)
THE PATIENT WAS SEEN PRIOR TO SURGERY TODAY.  HISTORY, ALLERGIES, HOME MEDICATIONS AND OPERATIVE PROCEDURE WERE REVIEWED. RISKS AND BENEFITS OF SURGERY DISCUSSED WITH PATIENT AGAIN.  NO CHANGES FROM INITIAL HISTORY AND PHYSICAL NOTED.    

## 2019-07-16 NOTE — Anesthesia Preprocedure Evaluation (Signed)
Anesthesia Evaluation  Patient identified by MRN, date of birth, ID band Patient awake    Reviewed: Allergy & Precautions, NPO status , Patient's Chart, lab work & pertinent test results  History of Anesthesia Complications Negative for: history of anesthetic complications  Airway Mallampati: II  TM Distance: >3 FB Neck ROM: Full    Dental  (+) Edentulous Upper, Edentulous Lower   Pulmonary neg pulmonary ROS, neg sleep apnea, neg COPD,    breath sounds clear to auscultation- rhonchi (-) wheezing      Cardiovascular hypertension, Pt. on medications (-) CAD, (-) Past MI, (-) Cardiac Stents and (-) CABG  Rhythm:Regular Rate:Normal - Systolic murmurs and - Diastolic murmurs    Neuro/Psych neg Seizures PSYCHIATRIC DISORDERS Depression CVA, No Residual Symptoms    GI/Hepatic negative GI ROS, Neg liver ROS,   Endo/Other  diabetes, Insulin Dependent  Renal/GU negative Renal ROS     Musculoskeletal R hip fracture    Abdominal (+) + obese,   Peds  Hematology  (+) anemia ,   Anesthesia Other Findings Past Medical History: No date: Anemia No date: Cancer ([facility]) No date: Coronary artery disease No date: Depression No date: Diabetes mellitus without complication (HCC) No date: Hypertension No date: Stroke [facility])   Reproductive/Obstetrics                             Anesthesia Physical Anesthesia Plan  ASA: III  Anesthesia Plan: General   Post-op Pain Management:    Induction: Intravenous  PONV Risk Score and Plan: 2 and Ondansetron, Dexamethasone and Treatment may vary due to age or medical condition  Airway Management Planned: Oral ETT  Additional Equipment:   Intra-op Plan:   Post-operative Plan: Extubation in OR  Informed Consent: I have reviewed the patients History and Physical, chart, labs and discussed the procedure including the risks, benefits and alternatives for the proposed  anesthesia with the patient or authorized representative who has indicated his/her understanding and acceptance.     Dental advisory given  Plan Discussed with: CRNA and Anesthesiologist  Anesthesia Plan Comments:         Anesthesia Quick Evaluation

## 2019-07-16 NOTE — Op Note (Signed)
07/16/2019  11:08 AM  PATIENT:  Alisha Winters    PRE-OPERATIVE DIAGNOSIS: Mildly angulated right femoral neck fracture  POST-OPERATIVE DIAGNOSIS:  Same  PROCEDURE:  CANNULATED HIP PINNING right femoral neck fracture  SURGEON:  Park Breed, MD     ANESTHESIA:   General  PREOPERATIVE INDICATIONS:  [name] is a  86 y.o. female who fell and was found to have a diagnosis of hip fracture who elected for surgical management.    The risks benefits and alternatives were discussed with the patient preoperatively including but not limited to the risks of infection, bleeding, nerve injury, cardiopulmonary complications, blood clots, malunion, nonunion, avascular necrosis, the need for revision surgery, the potential for conversion to hemiarthroplasty, among others, and the patient was willing to proceed.  OPERATIVE IMPLANTS: 7.3 mm cannulated screws x4  OPERATIVE FINDINGS: Clinical osteoporosis with weak bone, proximal femur  OPERATIVE PROCEDURE: The patient was brought to the operating room and placed in supine position. IV antibiotics were given. General anesthesia administered.  The patient was placed on the fracture table. The operative extremity was positioned, without any significant reduction maneuver and was prepped and draped in usual sterile fashion.  Time out was performed.  Small incisions were made distal to the greater trochanter, and 4 guidewires were introduced into the head and neck. The lengths were measured. The reduction was slightly valgus, and near-anatomic. I opened the cortex with a cannulated drill, and then placed the screws into position. Satisfactory fixation was achieved.  The wounds were irrigated copiously, and repaired with  3-O Nylon with and sterile gauze. Sponge and needle count were correct.  There no complications and the patient tolerated the procedure well.  The patient will be touch down weightbearing as tolerated, and will be put back on  Coumadin for DVT prophylaxis  Park Breed, MD

## 2019-07-16 NOTE — Anesthesia Postprocedure Evaluation (Signed)
Anesthesia Post Note  Patient: [name]  Procedure(s) Performed: CANNULATED HIP PINNING (Right Hip)  Patient location during evaluation: PACU Anesthesia Type: General Level of consciousness: awake and alert and oriented Pain management: pain level controlled Vital Signs Assessment: post-procedure vital signs reviewed and stable Respiratory status: spontaneous breathing, nonlabored ventilation and respiratory function stable Cardiovascular status: blood pressure returned to baseline and stable Postop Assessment: no signs of nausea or vomiting Anesthetic complications: no   No complications documented.   Last Vitals:  Vitals:   07/16/19 1145 07/16/19 1200  BP: (!) 153/77 (!) 141/78  Pulse: (!) 59 75  Resp: 20 15  Temp:  (!) 36.3 C  SpO2: 97% 97%    Last Pain:  Vitals:   07/16/19 1200  TempSrc:   PainSc: 0-No pain                 Marcey Persad

## 2019-07-17 ENCOUNTER — Encounter: Payer: Self-pay | Admitting: Specialist

## 2019-07-17 DIAGNOSIS — Z8673 Personal history of transient ischemic attack (TIA), and cerebral infarction without residual deficits: Secondary | ICD-10-CM

## 2019-07-17 LAB — CBC
HCT: 37.2 % (ref 36.0–46.0)
Hemoglobin: 12.3 g/dL (ref 12.0–15.0)
MCH: 27 pg (ref 26.0–34.0)
MCHC: 33.1 g/dL (ref 30.0–36.0)
MCV: 81.8 fL (ref 80.0–100.0)
Platelets: 215 10*3/uL (ref 150–400)
RBC: 4.55 MIL/uL (ref 3.87–5.11)
RDW: 16.1 % — ABNORMAL HIGH (ref 11.5–15.5)
WBC: 11.3 10*3/uL — ABNORMAL HIGH (ref 4.0–10.5)
nRBC: 0 % (ref 0.0–0.2)

## 2019-07-17 LAB — GLUCOSE, CAPILLARY
Glucose-Capillary: 234 mg/dL — ABNORMAL HIGH (ref 70–99)
Glucose-Capillary: 195 mg/dL — ABNORMAL HIGH (ref 70–99)
Glucose-Capillary: 275 mg/dL — ABNORMAL HIGH (ref 70–99)
Glucose-Capillary: 226 mg/dL — ABNORMAL HIGH (ref 70–99)
Glucose-Capillary: 297 mg/dL — ABNORMAL HIGH (ref 70–99)
Glucose-Capillary: 295 mg/dL — ABNORMAL HIGH (ref 70–99)

## 2019-07-17 LAB — BASIC METABOLIC PANEL
Anion gap: 6 (ref 5–15)
BUN: 26 mg/dL — ABNORMAL HIGH (ref 8–23)
CO2: 25 mmol/L (ref 22–32)
Calcium: 8 mg/dL — ABNORMAL LOW (ref 8.9–10.3)
Chloride: 104 mmol/L (ref 98–111)
Creatinine, Ser: 0.69 mg/dL (ref 0.44–1.00)
GFR calc Af Amer: >60 mL/min (ref >60)
GFR calc non Af Amer: >60 mL/min (ref >60)
Glucose, Bld: 245 mg/dL — ABNORMAL HIGH (ref 70–99)
Potassium: 4.2 mmol/L (ref 3.5–5.1)
Sodium: 135 mmol/L (ref 135–145)

## 2019-07-17 LAB — PROTIME-INR
INR: 1.5 — ABNORMAL HIGH (ref 0.8–1.2)
Prothrombin Time: 17.1 seconds — ABNORMAL HIGH (ref 11.4–15.2)

## 2019-07-17 MED ORDER — ASPIRIN EC 81 MG PO TBEC
81.0000 mg | DELAYED_RELEASE_TABLET | Freq: Every day | ORAL | Status: DC
  Administered 2019-07-17 – 2019-07-19 (×3): 81 mg via ORAL
  Filled 2019-07-17 (×3): qty 1

## 2019-07-17 MED ORDER — WARFARIN SODIUM 6 MG PO TABS
6.0000 mg | ORAL_TABLET | Freq: Every day | ORAL | Status: DC
  Administered 2019-07-17 – 2019-07-18 (×2): 6 mg via ORAL
  Filled 2019-07-17 (×3): qty 1

## 2019-07-17 MED ORDER — INSULIN GLARGINE 100 UNIT/ML ~~LOC~~ SOLN
10.0000 [IU] | Freq: Every day | SUBCUTANEOUS | Status: DC
  Administered 2019-07-18 – 2019-07-19 (×2): 10 [IU] via SUBCUTANEOUS
  Filled 2019-07-17 (×2): qty 0.1

## 2019-07-17 MED ORDER — INSULIN GLARGINE 100 UNIT/ML ~~LOC~~ SOLN
5.0000 [IU] | Freq: Every day | SUBCUTANEOUS | Status: DC
  Administered 2019-07-17: 5 [IU] via SUBCUTANEOUS
  Filled 2019-07-17 (×2): qty 0.05

## 2019-07-17 MED ORDER — MAGNESIUM SULFATE 2 GM/50ML IV SOLN: 2.0000 g | Freq: Once | INTRAVENOUS | Status: DC

## 2019-07-17 MED ORDER — POTASSIUM CHLORIDE 10 MEQ/100ML IV SOLN: 10.0000 meq | INTRAVENOUS | Status: DC

## 2019-07-17 NOTE — Progress Notes (Signed)
Subjective: 1 Day Post-Op Procedure(s) (LRB): CANNULATED HIP PINNING (Right)   The patient is alert and awake and out of bed in the chair.  She appears comfortable.  She has no pain at rest.  INR is 1.5 today.  Coumadin has been reestablished.  Remain touchdown weightbearing only.  Patient reports pain as mild.  Objective:   VITALS:   Vitals:   07/17/19 0747 07/17/19 1136  BP: (!) 154/86 131/63  Pulse: 64 69  Resp: 16 16  Temp: 98 F (36.7 C) 98.7 F (37.1 C)  SpO2: 96% 99%    Neurologically intact Neurovascular intact Sensation intact distally Intact pulses distally Dorsiflexion/Plantar flexion intact  LABS Recent Labs    07/16/19 0723 07/16/19 1341 07/17/19 0446  HGB 13.1 12.8 12.3  HCT 41.4 40.7 37.2  WBC 9.7 12.4* 11.3*  PLT 235 217 215    Recent Labs    07/15/19 0507 07/15/19 0507 07/15/19 2255 07/16/19 1341 07/17/19 0446  NA 136  --  139  --  135  K 4.7  --  4.1  --  4.2  BUN 19  --  17  --  26*  CREATININE 0.75   < > 0.76 0.82 0.69  GLUCOSE 262*  --  173*  --  245*   < > = values in this interval not displayed.    Recent Labs    07/16/19 0723 07/17/19 0446  INR 1.3* 1.5*     Assessment/Plan: 1 Day Post-Op Procedure(s) (LRB): CANNULATED HIP PINNING (Right)   Advance diet Up with therapy D/C IV fluids Discharge to SNF when available.  Coumadin for DVT prophylaxis  Return to my office in 2 weeks for exam and x-ray and stitch removal.

## 2019-07-17 NOTE — Progress Notes (Signed)
Patient consented to peri care and change of sheets this morning. Patient reports pain with movement. Administered PRN Norco following care. Reports no pain while at rest.

## 2019-07-17 NOTE — Evaluation (Signed)
Physical Therapy Evaluation Patient Details Name: Alisha Winters MRN: [id number] DOB: 09-13-1932 Today's Date: 07/17/2019   History of Present Illness  Pt is an 86 yo female s/p cannulated R hip pinning PMH of HTN, depression, CVA, DM, cancer.  Clinical Impression  Pt alert, reported anxiety about PT and moving. Pain in hip 0/10 at rest, pt exhibited mod pain signs/symptoms with mobility. Pt reported at baseline she is ambulatory with rollator, grandson lives with her and assists with homemaking, independent with ADLs.  Patient performed exercises with AAROM of RLE, instructed in weight bearing status. Supine to sit with modAx2. Sit <> stand with RW and maxAx2, pt unable to maintain NWB precautions. Returned to sitting, and lateral scoot to recliner performed maxAx2 with constant cueing to maintain precautions.  Overall the patient demonstrated deficits (see "PT Problem List") that impede the patient's functional abilities, safety, and mobility and would benefit from skilled PT intervention. Recommendation is SNF due to current level of assistance needed and inaccessible home environment.    Follow Up Recommendations SNF    Equipment Recommendations  Rolling walker with 5" wheels;3in1 (PT)    Recommendations for Other Services       Precautions / Restrictions Precautions Precautions: Fall Restrictions Weight Bearing Restrictions: Yes RLE Weight Bearing: Non weight bearing      Mobility  Bed Mobility Overal bed mobility: Needs Assistance Bed Mobility: Supine to Sit     Supine to sit: Mod assist;+2 for physical assistance;HOB elevated        Transfers Overall transfer level: Needs assistance Equipment used: Rolling walker (2 wheeled) Transfers: Sit to/from Stand;Lateral/Scoot Transfers Sit to Stand: Max assist;+2 physical assistance        Lateral/Scoot Transfers: Max assist;+2 physical assistance General transfer comment: Pt unable to perform sit <> stand and  maintain NWB status. Transitioned to lateral scoot with constant reminders of NWB of RLE.  Ambulation/Gait             General Gait Details: deferred  Stairs            Wheelchair Mobility    Modified Rankin (Stroke Patients Only)       Balance Overall balance assessment: Needs assistance Sitting-balance support: Feet supported Sitting balance-Leahy Scale: Poor       Standing balance-Leahy Scale: Zero                               Pertinent Vitals/Pain Pain Assessment: Faces Faces Pain Scale: Hurts little more Pain Location: R leg Pain Descriptors / Indicators: Grimacing;Moaning;Crying;Sore Pain Intervention(s): Limited activity within patient's tolerance;Monitored during session;Repositioned;Premedicated before session    Home Living Family/patient expects to be discharged to:: Skilled nursing facility Living Arrangements: Other relatives (grandson)                    Prior Function Level of Independence: Needs assistance   Gait / Transfers Assistance Needed: ambulates with rollator at baseline, grandson provides supervision  ADL's / [place] Needed: assistance for homemaking, pt able to dress independently  Comments: no other falls in the last 6 months     Hand Dominance        Extremity/Trunk Assessment   Upper Extremity Assessment Upper Extremity Assessment: Defer to OT evaluation    Lower Extremity Assessment Lower Extremity Assessment: Generalized weakness;RLE deficits/detail;LLE deficits/detail RLE Deficits / Details: NWB status LLE Deficits / Details: able to lift against gravity  Communication   Communication: No difficulties  Cognition Arousal/Alertness: Awake/alert Behavior During Therapy: Anxious;WFL for tasks assessed/performed Overall Cognitive Status: Within Functional Limits for tasks assessed                                        General Comments      Exercises  General Exercises - Lower Extremity Ankle Circles/Pumps: AROM;Both;10 reps Quad Sets: AROM;Strengthening;Right;10 reps Heel Slides: AAROM;Strengthening;Right;10 reps;AROM;Left Hip ABduction/ADduction: AAROM;Strengthening;Right;10 reps;AROM;Left   Assessment/Plan    PT Assessment Patient needs continued PT services  PT Problem List Decreased mobility;Decreased strength;Decreased safety awareness;Decreased range of motion;Decreased knowledge of precautions;Decreased activity tolerance;Decreased balance;Decreased knowledge of use of DME;Pain       PT Treatment Interventions DME instruction;Therapeutic exercise;Gait training;Balance training;Stair training;Neuromuscular re-education;Therapeutic activities;Patient/family education;Wheelchair mobility training    PT Goals (Current goals can be found in the Care Plan section)  Acute Rehab PT Goals Patient Stated Goal: to get better and go home PT Goal Formulation: With patient Time For Goal Achievement: 07/31/19 Potential to Achieve Goals: Good    Frequency BID   Barriers to discharge Inaccessible home environment      Co-evaluation               AM-PAC PT "6 Clicks" Mobility  Outcome Measure Help needed turning from your back to your side while in a flat bed without using bedrails?: A Lot Help needed moving from lying on your back to sitting on the side of a flat bed without using bedrails?: Total Help needed moving to and from a bed to a chair (including a wheelchair)?: Total Help needed standing up from a chair using your arms (e.g., wheelchair or bedside chair)?: Total Help needed to walk in hospital room?: Total Help needed climbing 3-5 steps with a railing? : Total 6 Click Score: 7    End of Session Equipment Utilized During Treatment: Gait belt;Oxygen (3L) Activity Tolerance: Patient limited by pain Patient left: in chair;with chair alarm set;with call bell/phone within reach;with family/visitor present;with  nursing/sitter in room Nurse Communication: Mobility status PT Visit Diagnosis: Other abnormalities of gait and mobility (R26.89);Muscle weakness (generalized) (M62.81);Difficulty in walking, not elsewhere classified (R26.2);Pain Pain - Right/Left: Right Pain - part of body: Hip    Time: 6144-3154 PT Time Calculation (min) (ACUTE ONLY): 32 min   Charges:   PT Evaluation $PT Eval Low Complexity: 1 Low PT Treatments $Therapeutic Exercise: 23-37 mins      Lieutenant Diego PT, DPT 12:13 PM,07/17/19

## 2019-07-17 NOTE — Progress Notes (Signed)
[facility] Encounter Note  Patient: Alisha Winters / Admit Date: 07/14/2019 / Date of Encounter: 07/17/2019, 2:41 PM   Subjective: Patient overall feels relatively well other than right hip pain and beginning rehabilitation.  No evidence of chest pain anginal symptoms and or acute coronary syndrome or congestive heart failure.  Atrial fibrillation with controlled ventricular rate which is chronic and hypertension well controlled with lisinopril. Echocardiogram showing low normal LV systolic function with moderate valvular heart disease unchanged from before.  Review of Systems: Positive for: Hip pain Negative for: Vision change, hearing change, syncope, dizziness, nausea, vomiting,diarrhea, bloody stool, stomach pain, cough, congestion, diaphoresis, urinary frequency, urinary pain,skin lesions, skin rashes Others previously listed  Objective: Telemetry: Atrial fibrillation with controlled ventricular rate Physical Exam: Blood pressure 131/63, pulse 69, temperature 98.7 F (37.1 C), temperature source Oral, resp. rate 16, height 6' (1.829 m), weight 117.9 kg, SpO2 99 %. Body mass index is 35.26 kg/m. General: Well developed, well nourished, in no acute distress. Head: Normocephalic, atraumatic, sclera non-icteric, no xanthomas, nares are without discharge. Neck: No apparent masses Lungs: Normal respirations with no wheezes, no rhonchi, no rales , no crackles   Heart: Irregular rate and rhythm, normal S1 S2, no murmur, no rub, no gallop, PMI is normal size and placement, carotid upstroke normal without bruit, jugular venous pressure normal Abdomen: Soft, non-tender, non-distended with normoactive bowel sounds. No hepatosplenomegaly. Abdominal aorta is normal size without bruit Extremities: Trace edema, no clubbing, no cyanosis, no ulcers,  Peripheral: 2+ radial, 2+ femoral, 2+ dorsal pedal pulses Neuro: Alert and oriented. Moves all extremities spontaneously. Psych:   Responds to questions appropriately with a normal affect.   Intake/Output Summary (Last 24 hours) at 07/17/2019 1441 Last data filed at 07/17/2019 1006 Gross per 24 hour  Intake 1664.83 ml  Output 900 ml  Net 764.83 ml    Inpatient Medications:  . acetaminophen  500 mg Oral QHS  . amLODipine  10 mg Oral Daily  . chlorhexidine  1 application Topical Once  . enoxaparin (LOVENOX) injection  40 mg Subcutaneous Q24H  . ferrous sulfate  325 mg Oral Q breakfast  . furosemide  40 mg Oral Daily  . insulin aspart  0-15 Units Subcutaneous Q4H  . insulin glargine  5 Units Subcutaneous Daily  . isosorbide mononitrate  30 mg Oral Daily  . lisinopril  10 mg Oral Daily  . mouth rinse  15 mL Mouth Rinse BID  . metoprolol succinate  50 mg Oral Daily  . senna  1 tablet Oral BID  . warfarin  5 mg Oral q1600  . Warfarin - Physician Dosing Inpatient   Does not apply q1600   Infusions:  . magnesium sulfate bolus IVPB      Labs: Recent Labs    07/15/19 2255 07/15/19 2255 07/16/19 1341 07/17/19 0446  NA 139  --   --  135  K 4.1  --   --  4.2  CL 107  --   --  104  CO2 28  --   --  25  GLUCOSE 173*  --   --  245*  BUN 17  --   --  26*  CREATININE 0.76   < > 0.82 0.69  CALCIUM 8.1*  --   --  8.0*   < > = values in this interval not displayed.   No results for input(s): AST, ALT, ALKPHOS, BILITOT, PROT, ALBUMIN in the last 72 hours. Recent Labs    07/14/19  2314 07/15/19 0223 07/16/19 1341 07/17/19 0446  WBC 9.4   < > 12.4* 11.3*  NEUTROABS 7.3  --   --   --   HGB 12.9   < > 12.8 12.3  HCT 39.9   < > 40.7 37.2  MCV 82.4   < > 84.4 81.8  PLT 252   < > 217 215   < > = values in this interval not displayed.   No results for input(s): CKTOTAL, CKMB, TROPONINI in the last 72 hours. Invalid input(s): POCBNP Recent Labs    07/15/19 1056  HGBA1C 8.8*     Weights: Filed Weights   07/14/19 2219  Weight: 117.9 kg     Radiology/Studies:  DG Chest 1 View  Result Date:  07/14/2019 CLINICAL DATA:  Fall with right hip pain EXAM: CHEST  1 VIEW COMPARISON:  None. FINDINGS: Right hemidiaphragm is elevated. There is bibasilar atelectasis. No focal consolidation pulmonary edema. Normal cardiomediastinal contours. No pleural effusion. IMPRESSION: Right hemidiaphragm elevation with bibasilar atelectasis. Electronically Signed   By: Ulyses Jarred M.D.   On: 07/14/2019 22:51   CT HEAD WO CONTRAST  Result Date: 07/14/2019 CLINICAL DATA:  Fall EXAM: CT HEAD WITHOUT CONTRAST TECHNIQUE: Contiguous axial images were obtained from the base of the skull through the vertex without intravenous contrast. COMPARISON:  None. FINDINGS: Brain: There is no mass, hemorrhage or extra-axial collection. The size and configuration of the ventricles and extra-axial CSF spaces are normal. The brain parenchyma is normal, without acute or chronic infarction. Vascular: No abnormal hyperdensity of the major intracranial arteries or dural venous sinuses. No intracranial atherosclerosis. Skull: The visualized skull base, calvarium and extracranial soft tissues are normal. Sinuses/Orbits: No fluid levels or advanced mucosal thickening of the visualized paranasal sinuses. No mastoid or middle ear effusion. The orbits are normal. IMPRESSION: Normal head CT. Electronically Signed   By: Ulyses Jarred M.D.   On: 07/14/2019 22:56   CT HIP RIGHT WO CONTRAST  Result Date: 07/15/2019 CLINICAL DATA:  Right hip pain after fall EXAM: CT OF THE RIGHT HIP WITHOUT CONTRAST TECHNIQUE: Multidetector CT imaging of the right hip was performed according to the standard protocol. Multiplanar CT image reconstructions were also generated. COMPARISON:  None. FINDINGS: Bones/Joint/Cartilage There is a transverse fracture of the right femoral neck with mild posterior angulation. There is foreshortening of the neck. No acetabular fracture. No dislocation of the femoral head. Ligaments Suboptimally assessed by CT. Muscles and Tendons No  intramuscular hematoma. Soft tissues Unremarkable IMPRESSION: Transverse fracture of the right femoral neck with mild posterior angulation and foreshortening of the neck. Electronically Signed   By: Ulyses Jarred M.D.   On: 07/15/2019 00:20   ECHOCARDIOGRAM COMPLETE  Result Date: 07/15/2019    ECHOCARDIOGRAM REPORT   Patient Name:   Alisha Winters Date of Exam: 07/15/2019 Medical Rec #:  [id number]        Height:       72.0 in Accession #:    [id number]       Weight:       260.0 lb Date of Birth:  March 20, 1932        BSA:          2.382 m Patient Age:    63 years         BP:           155/77 mmHg Patient Gender: F                HR:  70 bpm. Exam Location:  [facility] Procedure: 2D Echo, Cardiac Doppler and Color Doppler Indications:     Pre-operative cardiovascular examination V72.81 / Z01.810  History:         Patient has prior history of Echocardiogram examinations. CAD,                  Stroke; Risk Factors:Hypertension.  Sonographer:     Alyse Low Roar Referring Phys:  [id number] Harvie [place] Diagnosing Phys: Serafina Royals MD IMPRESSIONS  1. Left ventricular ejection fraction, by estimation, is 50 to 55%. The left ventricle has low normal function. The left ventricle has no regional wall motion abnormalities. Left ventricular diastolic function could not be evaluated.  2. Right ventricular systolic function is normal. The right ventricular size is normal. There is severely elevated pulmonary artery systolic pressure.  3. The mitral valve is normal in structure. Mild mitral valve regurgitation.  4. Tricuspid valve regurgitation is mild to moderate.  5. The aortic valve is normal in structure. Aortic valve regurgitation is trivial. FINDINGS  Left Ventricle: Left ventricular ejection fraction, by estimation, is 50 to 55%. The left ventricle has low normal function. The left ventricle has no regional wall motion abnormalities. The left ventricular internal cavity size was normal in size. There is no left  ventricular hypertrophy. Left ventricular diastolic function could not be evaluated. Right Ventricle: The right ventricular size is normal. No increase in right ventricular wall thickness. Right ventricular systolic function is normal. There is severely elevated pulmonary artery systolic pressure. The tricuspid regurgitant velocity is 3.73 m/s, and with an assumed right atrial pressure of 10 mmHg, the estimated right ventricular systolic pressure is 65.6 mmHg. Left Atrium: Left atrial size was normal in size. Right Atrium: Right atrial size was normal in size. Pericardium: There is no evidence of pericardial effusion. Mitral Valve: The mitral valve is normal in structure. Mild mitral valve regurgitation. Tricuspid Valve: The tricuspid valve is normal in structure. Tricuspid valve regurgitation is mild to moderate. Aortic Valve: The aortic valve is normal in structure. Aortic valve regurgitation is trivial. Aortic valve mean gradient measures 5.0 mmHg. Aortic valve peak gradient measures 9.4 mmHg. Aortic valve area, by VTI measures 1.68 cm. Pulmonic Valve: The pulmonic valve was normal in structure. Pulmonic valve regurgitation is not visualized. Aorta: The aortic root and ascending aorta are structurally normal, with no evidence of dilitation. IAS/Shunts: No atrial level shunt detected by color flow Doppler.  LEFT VENTRICLE PLAX 2D LVIDd:         4.49 cm  Diastology LVIDs:         3.32 cm  LV e' lateral:   11.60 cm/s LV PW:         1.16 cm  LV E/e' lateral: 8.6 LV IVS:        1.16 cm  LV e' medial:    6.96 cm/s LVOT diam:     1.90 cm  LV E/e' medial:  14.4 LV SV:         59 LV SV Index:   25 LVOT Area:     2.84 cm  RIGHT VENTRICLE RV S prime:     12.50 cm/s TAPSE (M-mode): 1.9 cm LEFT ATRIUM             Index       RIGHT ATRIUM           Index LA Vol (A2C):   69.9 ml 29.35 ml/m RA Area:     16.70 cm LA Vol (  A4C):   66.1 ml 27.75 ml/m RA Volume:   40.40 ml  16.96 ml/m LA Biplane Vol: 68.4 ml 28.72 ml/m  AORTIC  VALVE                    PULMONIC VALVE AV Area (Vmax):    1.66 cm     PV Vmax:        1.14 m/s AV Area (Vmean):   1.64 cm     PV Peak grad:   5.2 mmHg AV Area (VTI):     1.68 cm     RVOT Peak grad: 2 mmHg AV Vmax:           153.00 cm/s AV Vmean:          103.000 cm/s AV VTI:            0.350 m AV Peak Grad:      9.4 mmHg AV Mean Grad:      5.0 mmHg LVOT Vmax:         89.40 cm/s LVOT Vmean:        59.500 cm/s LVOT VTI:          0.207 m LVOT/AV VTI ratio: 0.59  AORTA Ao Root diam: 3.20 cm MITRAL VALVE                TRICUSPID VALVE MV Area (PHT): 3.77 cm     TR Peak grad:   55.7 mmHg MV Decel Time: 201 msec     TR Vmax:        373.00 cm/s MV E velocity: 100.00 cm/s                             SHUNTS                             Systemic VTI:  0.21 m                             Systemic Diam: 1.90 cm Serafina Royals MD Electronically signed by Serafina Royals MD Signature Date/Time: 07/15/2019/7:16:13 PM    Final    DG HIP OPERATIVE UNILAT W OR W/O PELVIS RIGHT  Result Date: 07/16/2019 CLINICAL DATA:  Status post surgical internal fixation of right hip fracture. EXAM: OPERATIVE right HIP (WITH PELVIS IF PERFORMED) 8 VIEWS TECHNIQUE: Fluoroscopic spot image(s) were submitted for interpretation post-operatively. Radiation exposure index: 11.865 mGy. COMPARISON:  July 14, 2019. FINDINGS: Eight intraoperative fluoroscopic images demonstrate surgical internal fixation of proximal right femoral neck fracture. Good alignment of fracture components is noted. IMPRESSION: Status post surgical internal fixation of proximal right femoral neck fracture. Electronically Signed   By: Marijo Conception M.D.   On: 07/16/2019 11:14   DG Hip Unilat W or Wo Pelvis 2-3 Views Right  Result Date: 07/14/2019 CLINICAL DATA:  [place], right hip pain EXAM: DG HIP (WITH OR WITHOUT PELVIS) 2-3V RIGHT COMPARISON:  None. FINDINGS: Frontal view of the pelvis as well as frontal and cross-table lateral views of the right hip are obtained. There is  an impacted basicervical right hip fracture with varus angulation. No dislocation. The remainder of the bony pelvis is unremarkable. Sacroiliac joints are normal. IMPRESSION: 1. Impacted basicervical right hip fracture with varus angulation. Electronically Signed   By: Randa Ngo M.D.   On: 07/14/2019 22:52  Assessment and Recommendation  86 y.o. female with known chronic nonvalvular atrial fibrillation hypertension borderline diabetes coronary atherosclerosis with right hip fracture after fall and no current evidence of acute coronary syndrome and or congestive heart failure at lowest risk possible for cardiovascular complication with orthopedic surgery 1.  Continue physical rehabilitation without restriction 2.  Continue low-dose metoprolol at current dose without change for heart rate control of chronic nonvalvular atrial fibrillation 3.  Hypertension control with lisinopril at current dose without change 4.  No further cardiac diagnostics and/or intervention necessary at this time 5.  Reinstatement of anticoagulation with warfarin at previous dose after surgery when appropriate for further risk reduction in stroke with atrial fibrillation 6.  Okay for discharged home from cardiac standpoint without restriction in rehabilitation with follow-up in 2 weeks for further adjustments of medication management 7.  Call if further questions otherwise will assume the patient has been discharged as per above  Signed, Serafina Royals M.D. FACC

## 2019-07-17 NOTE — TOC Progression Note (Signed)
Transition of Care [facility]) - Progression Note    Patient Details  Name: Alisha Winters MRN: [id number] Date of Birth: 1932-08-01  Transition of Care [facility]) CM/SW Tyrrell, RN Phone Number: 07/17/2019, 1:38 PM  Clinical Narrative:   Met with the patient and family in the room, the patient lives at home with her Yolanda Bonine, he provides transportation when needed, She wants to go to rehab in [facility], She has had covid vaccines, I explained the bedsearch process and obtaining insurance auth process, I sent the FL2, PASSR and bedsearch, once bed offers are obtained will review and get choice then get Ins approval         Expected Discharge Plan and Services                                                 Social Determinants of Health (SDOH) Interventions    Readmission Risk Interventions No flowsheet data found.

## 2019-07-17 NOTE — Progress Notes (Addendum)
PROGRESS NOTE    CORDA SHUTT  HYI:[id number] DOB: [birth year] DOA: 07/14/2019 PCP: Derinda Late, MD    Brief Narrative:  BarbaraSteeleis a 86 y.o.femalewith a known history of afib on Coumadin, HTN, CAD, DM, CVA, anemiapresents to the emergency department for evaluation of right hip pain.. Patient was in a usual state of health until earlier today when she had an episode of vertigo and sustained a mechanical fall with head trauma but no LOC. She comes to the ER with complaint of right hip pain, unable to move, stand or walk.    Consultants:   Cardiology, orthopedics  Procedures:   Antimicrobials:      Subjective: Family at bedside this AM.  Patient declines any shortness of breath, chest pain, abdominal pain or any kind of issues  Objective: Vitals:   07/16/19 2004 07/17/19 0015 07/17/19 0547 07/17/19 0747  BP: (!) 151/79 (!) 167/92 (!) 155/80 (!) 154/86  Pulse: 78 (!) 58 66 64  Resp: 19 17 18 16   Temp: 98.4 F (36.9 C) 97.7 F (36.5 C) 98.6 F (37 C) 98 F (36.7 C)  TempSrc: Oral Oral Oral Oral  SpO2: 97% 98% 99% 96%  Weight:      Height:        Intake/Output Summary (Last 24 hours) at 07/17/2019 0831 Last data filed at 07/17/2019 0600 Gross per 24 hour  Intake 1924.83 ml  Output 1300 ml  Net 624.83 ml   Filed Weights   07/14/19 2219  Weight: 117.9 kg    Examination:  General exam: Calm comfortable, NAD Respiratory system: Clear to auscultation. Respiratory effort normal.  No wheeze rales rhonchi's Cardiovascular system: RRR S1-S2 no murmurs rubs gallops  Gastrointestinal system: Soft, NT, ND positive bowel sounds no rebound. Central nervous system: Alert oriented x3 grossly intact Extremities: No edema of left extremity, right lower extremity stabilized Skin: Warm dry Psychiatry: Judgement and insight appear normal. Mood & affect appropriate.     Data Reviewed: I have personally reviewed following labs and imaging  studies  CBC: Recent Labs  Lab 07/14/19 2314 07/14/19 2314 07/15/19 0223 07/15/19 0507 07/16/19 0723 07/16/19 1341 07/17/19 0446  WBC 9.4   < > 13.3* 13.4* 9.7 12.4* 11.3*  NEUTROABS 7.3  --   --   --   --   --   --   HGB 12.9   < > 13.2 13.6 13.1 12.8 12.3  HCT 39.9   < > 40.9 41.1 41.4 40.7 37.2  MCV 82.4   < > 82.6 81.4 84.1 84.4 81.8  PLT 252   < > 257 261 235 217 215   < > = values in this interval not displayed.   Basic Metabolic Panel: Recent Labs  Lab 07/14/19 2314 07/14/19 2314 07/15/19 0223 07/15/19 0507 07/15/19 2255 07/16/19 1341 07/17/19 0446  NA 135  --   --  136 139  --  135  K 4.0  --   --  4.7 4.1  --  4.2  CL 102  --   --  103 107  --  104  CO2 24  --   --  26 28  --  25  GLUCOSE 258*  --   --  262* 173*  --  245*  BUN 22  --   --  19 17  --  26*  CREATININE 0.89   < > 0.75 0.75 0.76 0.82 0.69  CALCIUM 8.3*  --   --  8.4* 8.1*  --  8.0*   < > =  values in this interval not displayed.   GFR: Estimated Creatinine Clearance: 73.9 mL/min (by C-G formula based on SCr of 0.69 mg/dL). Liver Function Tests: No results for input(s): AST, ALT, ALKPHOS, BILITOT, PROT, ALBUMIN in the last 168 hours. No results for input(s): LIPASE, AMYLASE in the last 168 hours. No results for input(s): AMMONIA in the last 168 hours. Coagulation Profile: Recent Labs  Lab 07/15/19 0507 07/15/19 1056 07/15/19 2255 07/16/19 0723 07/17/19 0446  INR 2.0* 1.7* 1.3* 1.3* 1.5*   Cardiac Enzymes: No results for input(s): CKTOTAL, CKMB, CKMBINDEX, TROPONINI in the last 168 hours. BNP (last 3 results) No results for input(s): PROBNP in the last 8760 hours. HbA1C: Recent Labs    07/15/19 1056  HGBA1C 8.8*   CBG: Recent Labs  Lab 07/16/19 1116 07/16/19 1621 07/16/19 2116 07/16/19 2254 07/17/19 0317  GLUCAP 223* 368* 311* 314* 234*   Lipid Profile: No results for input(s): CHOL, HDL, LDLCALC, TRIG, CHOLHDL, LDLDIRECT in the last 72 hours. Thyroid Function  Tests: No results for input(s): TSH, T4TOTAL, FREET4, T3FREE, THYROIDAB in the last 72 hours. Anemia Panel: No results for input(s): VITAMINB12, FOLATE, FERRITIN, TIBC, IRON, RETICCTPCT in the last 72 hours. Sepsis Labs: No results for input(s): PROCALCITON, LATICACIDVEN in the last 168 hours.  Recent Results (from the past 240 hour(s))  SARS Coronavirus 2 by RT PCR (hospital order, performed in [facility] hospital lab) Nasopharyngeal Nasopharyngeal Swab     Status: None   Collection Time: 07/14/19 11:14 PM   Specimen: Nasopharyngeal Swab  Result Value Ref Range Status   SARS Coronavirus 2 NEGATIVE NEGATIVE Final    Comment: (NOTE) SARS-CoV-2 target nucleic acids are NOT DETECTED.  The SARS-CoV-2 RNA is generally detectable in upper and lower respiratory specimens during the acute phase of infection. The lowest concentration of SARS-CoV-2 viral copies this assay can detect is 250 copies / mL. A negative result does not preclude SARS-CoV-2 infection and should not be used as the sole basis for treatment or other patient management decisions.  A negative result may occur with improper specimen collection / handling, submission of specimen other than nasopharyngeal swab, presence of viral mutation(s) within the areas targeted by this assay, and inadequate number of viral copies (<250 copies / mL). A negative result must be combined with clinical observations, patient history, and epidemiological information.  Fact Sheet for Patients:   StrictlyIdeas.no  Fact Sheet for Healthcare Providers: BankingDealers.co.za  This test is not yet approved or  cleared by the [place] FDA and has been authorized for detection and/or diagnosis of SARS-CoV-2 by FDA under an Emergency Use Authorization (EUA).  This EUA will remain in effect (meaning this test can be used) for the duration of the COVID-19 declaration under Section 564(b)(1) of the  Act, 21 U.S.C. section 360bbb-3(b)(1), unless the authorization is terminated or revoked sooner.  Performed at [facility], [address], [place], [place] [address]   Surgical pcr screen     Status: None   Collection Time: 07/15/19  8:15 AM   Specimen: Nasal Mucosa; Nasal Swab  Result Value Ref Range Status   MRSA, PCR NEGATIVE NEGATIVE Final   Staphylococcus aureus NEGATIVE NEGATIVE Final    Comment: (NOTE) The Xpert SA Assay (FDA approved for NASAL specimens in patients 51 years of age and older), is one component of a comprehensive surveillance program. It is not intended to diagnose infection nor to guide or monitor treatment. Performed at [facility], [address] [place],  [place] [address]          Radiology Studies: ECHOCARDIOGRAM COMPLETE  Result Date: 07/15/2019    ECHOCARDIOGRAM REPORT   Patient Name:   Alisha Winters Date of Exam: 07/15/2019 Medical Rec #:  [id number]        Height:       72.0 in Accession #:    [id number]       Weight:       260.0 lb Date of Birth:  [birth year]        BSA:          2.382 m Patient Age:    82 years         BP:           155/77 mmHg Patient Gender: F                HR:           70 bpm. Exam Location:  [facility] Procedure: 2D Echo, Cardiac Doppler and Color Doppler Indications:     Pre-operative cardiovascular examination V72.81 / Z01.810  History:         Patient has prior history of Echocardiogram examinations. CAD,                  Stroke; Risk Factors:Hypertension.  Sonographer:     Alyse Low Roar Referring Phys:  [id number] Harvie Bridge Diagnosing Phys: Serafina Royals MD IMPRESSIONS  1. Left ventricular ejection fraction, by estimation, is 50 to 55%. The left ventricle has low normal function. The left ventricle has no regional wall motion abnormalities. Left ventricular diastolic function could not be evaluated.  2. Right ventricular systolic function is normal. The right ventricular size is normal. There is severely  elevated pulmonary artery systolic pressure.  3. The mitral valve is normal in structure. Mild mitral valve regurgitation.  4. Tricuspid valve regurgitation is mild to moderate.  5. The aortic valve is normal in structure. Aortic valve regurgitation is trivial. FINDINGS  Left Ventricle: Left ventricular ejection fraction, by estimation, is 50 to 55%. The left ventricle has low normal function. The left ventricle has no regional wall motion abnormalities. The left ventricular internal cavity size was normal in size. There is no left ventricular hypertrophy. Left ventricular diastolic function could not be evaluated. Right Ventricle: The right ventricular size is normal. No increase in right ventricular wall thickness. Right ventricular systolic function is normal. There is severely elevated pulmonary artery systolic pressure. The tricuspid regurgitant velocity is 3.73 m/s, and with an assumed right atrial pressure of 10 mmHg, the estimated right ventricular systolic pressure is 56.4 mmHg. Left Atrium: Left atrial size was normal in size. Right Atrium: Right atrial size was normal in size. Pericardium: There is no evidence of pericardial effusion. Mitral Valve: The mitral valve is normal in structure. Mild mitral valve regurgitation. Tricuspid Valve: The tricuspid valve is normal in structure. Tricuspid valve regurgitation is mild to moderate. Aortic Valve: The aortic valve is normal in structure. Aortic valve regurgitation is trivial. Aortic valve mean gradient measures 5.0 mmHg. Aortic valve peak gradient measures 9.4 mmHg. Aortic valve area, by VTI measures 1.68 cm. Pulmonic Valve: The pulmonic valve was normal in structure. Pulmonic valve regurgitation is not visualized. Aorta: The aortic root and ascending aorta are structurally normal, with no evidence of dilitation. IAS/Shunts: No atrial level shunt detected by color flow Doppler.  LEFT VENTRICLE PLAX 2D LVIDd:         4.49 cm  Diastology LVIDs:  3.32  cm  LV e' lateral:   11.60 cm/s LV PW:         1.16 cm  LV E/e' lateral: 8.6 LV IVS:        1.16 cm  LV e' medial:    6.96 cm/s LVOT diam:     1.90 cm  LV E/e' medial:  14.4 LV SV:         59 LV SV Index:   25 LVOT Area:     2.84 cm  RIGHT VENTRICLE RV S prime:     12.50 cm/s TAPSE (M-mode): 1.9 cm LEFT ATRIUM             Index       RIGHT ATRIUM           Index LA Vol (A2C):   69.9 ml 29.35 ml/m RA Area:     16.70 cm LA Vol (A4C):   66.1 ml 27.75 ml/m RA Volume:   40.40 ml  16.96 ml/m LA Biplane Vol: 68.4 ml 28.72 ml/m  AORTIC VALVE                    PULMONIC VALVE AV Area (Vmax):    1.66 cm     PV Vmax:        1.14 m/s AV Area (Vmean):   1.64 cm     PV Peak grad:   5.2 mmHg AV Area (VTI):     1.68 cm     RVOT Peak grad: 2 mmHg AV Vmax:           153.00 cm/s AV Vmean:          103.000 cm/s AV VTI:            0.350 m AV Peak Grad:      9.4 mmHg AV Mean Grad:      5.0 mmHg LVOT Vmax:         89.40 cm/s LVOT Vmean:        59.500 cm/s LVOT VTI:          0.207 m LVOT/AV VTI ratio: 0.59  AORTA Ao Root diam: 3.20 cm MITRAL VALVE                TRICUSPID VALVE MV Area (PHT): 3.77 cm     TR Peak grad:   55.7 mmHg MV Decel Time: 201 msec     TR Vmax:        373.00 cm/s MV E velocity: 100.00 cm/s                             SHUNTS                             Systemic VTI:  0.21 m                             Systemic Diam: 1.90 cm Serafina Royals MD Electronically signed by Serafina Royals MD Signature Date/Time: 07/15/2019/7:16:13 PM    Final    DG HIP OPERATIVE UNILAT W OR W/O PELVIS RIGHT  Result Date: 07/16/2019 CLINICAL DATA:  Status post surgical internal fixation of right hip fracture. EXAM: OPERATIVE right HIP (WITH PELVIS IF PERFORMED) 8 VIEWS TECHNIQUE: Fluoroscopic spot image(s) were submitted for interpretation post-operatively. Radiation exposure index: 11.865 mGy. COMPARISON:  July 14, 2019. FINDINGS:  Eight intraoperative fluoroscopic images demonstrate surgical internal fixation of proximal right  femoral neck fracture. Good alignment of fracture components is noted. IMPRESSION: Status post surgical internal fixation of proximal right femoral neck fracture. Electronically Signed   By: Marijo Conception M.D.   On: 07/16/2019 11:14        Scheduled Meds: . acetaminophen  500 mg Oral QHS  . amLODipine  10 mg Oral Daily  . chlorhexidine  1 application Topical Once  . enoxaparin (LOVENOX) injection  40 mg Subcutaneous Q24H  . ferrous sulfate  325 mg Oral Q breakfast  . furosemide  40 mg Oral Daily  . insulin aspart  0-15 Units Subcutaneous Q4H  . insulin glargine  5 Units Subcutaneous Daily  . isosorbide mononitrate  30 mg Oral Daily  . lisinopril  10 mg Oral Daily  . mouth rinse  15 mL Mouth Rinse BID  . metoprolol succinate  50 mg Oral Daily  . senna  1 tablet Oral BID  . warfarin  5 mg Oral q1600  . Warfarin - Physician Dosing Inpatient   Does not apply q1600   Continuous Infusions: .  ceFAZolin (ANCEF) IV Stopped (07/17/19 0145)  . clindamycin (CLEOCIN) IV Stopped (07/17/19 0239)  . magnesium sulfate bolus IVPB      Assessment & Plan:   Active Problems:   Closed intertrochanteric fracture of hip, right, initial encounter [facility] [place])   This is a 86 y.o. female with a history of afib on Coumadin,  HTN, CAD, DM, CVA, anemia now being admitted with:  # R intertrochanteric hip fracture Status post right femoral neck fracture repair/pinning - Pain management Cleared by cardiology for surgery PT recommended SNF Coumadin for DVT prophylaxis Follow-up with Dr. Sabra Heck in 2 weeks for exam and x-ray and stitch removal Incentive spirometer   #. Afib on Coumadin, rate controlled Resumed Coumadin-INR is 1.5 - Continue metoprolol Plan: Increase Coumadin to 6 mg tonight Check INR in a.m. Follow-up with cardiology as outpatient in 2 weeks after discharge cleared for discharge from cardiac standpoint  #. History of HTN - Continue Norvasc, lisinopril, beta-blockers  #. History  of CVA - Held aspirin for OR..> Spoke to Dr. Prince Solian patient to be started on aspirin today  #. H/o Diabetes - Accuchecks q4h with RISS coverage - Hold NPH Started on lantus here, will increase to 10 units tonight. Likely can d/c on home novology on d/c   Admission status:  Lovenox until Coumadin is therapeutic Code Status: Full Code  Communication: Family at bedside Disposition: SNF  Remains inpatient appropriate because: Unsafe DC  Dispo: The patient is from: Home              Anticipated d/c is to: SNF              Anticipated d/c date is: 1- 2 days              Patient currently is not medically stable to d/c., awaiting SNF bed and authorization.             LOS: 2 days   Time spent:40 minutes with more than 50% on [place], MD [facility] Pager [phone]  If 7PM-7AM, please contact night-coverage [email or web address] Password [facility] 07/17/2019, 8:31 AM

## 2019-07-17 NOTE — NC FL2 (Signed)
[place] LEVEL OF CARE SCREENING TOOL     IDENTIFICATION  Patient Name: Alisha Winters Birthdate: Oct 25, 1932 Sex: female Admission Date (Current Location): 07/14/2019  [facility] and [place] Number:  Engineering geologist and Address:  [facility], [address] [place], [place] [address]      Provider Number: [id number]  Attending Physician Name and Address:  Nolberto Hanlon, MD  Relative Name and Phone Number:  Roselyn Reef [phone]    Current Level of Care: Hospital Recommended Level of Care: [place] Prior Approval Number:    Date Approved/Denied:   PASRR Number: [id number]  Discharge Plan: SNF    Current Diagnoses: Patient Active Problem List   Diagnosis Date Noted   Closed intertrochanteric fracture of hip, right, initial encounter ([place]) 07/15/2019    Orientation RESPIRATION BLADDER Height & Weight     Self, Time, Situation, Place  O2 (2 liters acute) Continent Weight: 117.9 kg Height:  6' (182.9 cm)  BEHAVIORAL SYMPTOMS/MOOD NEUROLOGICAL BOWEL NUTRITION STATUS      Continent Diet (regular diet)  AMBULATORY STATUS COMMUNICATION OF NEEDS Skin   Extensive Assist   Surgical wounds                       Personal Care Assistance Level of Assistance  Bathing, Dressing Bathing Assistance: Limited assistance   Dressing Assistance: Limited assistance     Functional Limitations Info             SPECIAL CARE FACTORS FREQUENCY  PT (By licensed PT)     PT Frequency: 5 days a week              Contractures Contractures Info: Not present    Additional Factors Info  Code Status, Allergies Code Status Info: Full code Allergies Info: Penicillins, Atorvastatin, Citalopram, Codeine, Meloxicam, Simvastatin, Hydrochlorothiazide, Cephalexin           Current Medications (07/17/2019):  This is the current hospital active medication list Current Facility-Administered Medications  Medication Dose  Route Frequency Provider Last Rate Last Admin   acetaminophen (TYLENOL) tablet 325-650 mg  325-650 mg Oral Q6H PRN Earnestine Leys, MD       acetaminophen (TYLENOL) tablet 500 mg  500 mg Oral QHS Earnestine Leys, MD   500 mg at 07/16/19 2118   albuterol (PROVENTIL) (2.5 MG/3ML) 0.083% nebulizer solution 2.5 mg  2.5 mg Nebulization Q6H PRN Earnestine Leys, MD       alum & mag hydroxide-simeth (MAALOX/MYLANTA) 200-200-20 MG/5ML suspension 30 mL  30 mL Oral Q4H PRN Earnestine Leys, MD       amLODipine (NORVASC) tablet 10 mg  10 mg Oral Daily Earnestine Leys, MD   10 mg at 07/17/19 0915   bisacodyl (DULCOLAX) suppository 10 mg  10 mg Rectal Daily PRN Earnestine Leys, MD       chlorhexidine (HIBICLENS) 4 % liquid 1 application  1 application Topical Once Earnestine Leys, MD       enoxaparin (LOVENOX) injection 40 mg  40 mg Subcutaneous Q24H Earnestine Leys, MD   40 mg at 07/17/19 4665   ferrous sulfate tablet 325 mg  325 mg Oral Q breakfast Earnestine Leys, MD   325 mg at 07/17/19 0809   furosemide (LASIX) tablet 40 mg  40 mg Oral Daily Earnestine Leys, MD   40 mg at 07/17/19 0915   HYDROcodone-acetaminophen ([place]) 7.5-325 MG per tablet 1 tablet  1 tablet Oral Q6H PRN Earnestine Leys, MD  HYDROcodone-acetaminophen (NORCO/VICODIN) 5-325 MG per tablet 1 tablet  1 tablet Oral Q4H PRN Earnestine Leys, MD   1 tablet at 07/15/19 1844   HYDROcodone-acetaminophen (NORCO/VICODIN) 5-325 MG per tablet 1 tablet  1 tablet Oral Q6H PRN Earnestine Leys, MD   1 tablet at 07/17/19 6295   insulin aspart (novoLOG) injection 0-15 Units  0-15 Units Subcutaneous Q4H Earnestine Leys, MD   8 Units at 07/17/19 1216   insulin glargine (LANTUS) injection 5 Units  5 Units Subcutaneous Daily Nolberto Hanlon, MD   5 Units at 07/17/19 0926   isosorbide mononitrate (IMDUR) 24 hr tablet 30 mg  30 mg Oral Daily Earnestine Leys, MD   30 mg at 07/17/19 0915   lisinopril (ZESTRIL) tablet 10 mg  10 mg Oral Daily Earnestine Leys, MD   10  mg at 07/17/19 0915   magnesium hydroxide (MILK OF MAGNESIA) suspension 30 mL  30 mL Oral Daily PRN Earnestine Leys, MD       magnesium sulfate IVPB 2 g 50 mL  2 g Intravenous Once Lang Snow, NP       MEDLINE mouth rinse  15 mL Mouth Rinse BID Earnestine Leys, MD   15 mL at 07/16/19 2200   menthol-cetylpyridinium (CEPACOL) lozenge 3 mg  1 lozenge Oral PRN Earnestine Leys, MD       Or   phenol (CHLORASEPTIC) mouth spray 1 spray  1 spray Mouth/Throat PRN Earnestine Leys, MD       metoCLOPramide (REGLAN) tablet 5-10 mg  5-10 mg Oral Q8H PRN Earnestine Leys, MD       Or   metoCLOPramide (REGLAN) injection 5-10 mg  5-10 mg Intravenous Q8H PRN Earnestine Leys, MD       metoprolol succinate (TOPROL-XL) 24 hr tablet 50 mg  50 mg Oral Daily Earnestine Leys, MD   50 mg at 07/17/19 0915   morphine 2 MG/ML injection 1 mg  1 mg Intravenous Q2H PRN Earnestine Leys, MD   1 mg at 07/15/19 0556   morphine 2 MG/ML injection 1 mg  1 mg Intravenous Q2H PRN Earnestine Leys, MD       morphine 2 MG/ML injection 2 mg  2 mg Intravenous Q4H PRN Earnestine Leys, MD   2 mg at 07/15/19 1221   ondansetron (ZOFRAN) tablet 4 mg  4 mg Oral Q6H PRN Earnestine Leys, MD       Or   ondansetron [facility]) injection 4 mg  4 mg Intravenous Q6H PRN Earnestine Leys, MD       senna Donavan Burnet) tablet 8.6 mg  1 tablet Oral BID Earnestine Leys, MD   8.6 mg at 07/17/19 0915   sodium phosphate (FLEET) 7-19 GM/118ML enema 1 enema  1 enema Rectal Once PRN Earnestine Leys, MD       warfarin (COUMADIN) tablet 5 mg  5 mg Oral q1600 Earnestine Leys, MD   5 mg at 07/16/19 1720   Warfarin - Physician Dosing Inpatient   Does not apply [id number] Nolberto Hanlon, MD       zolpidem (AMBIEN) tablet 5 mg  5 mg Oral QHS PRN Earnestine Leys, MD         Discharge Medications: Please see discharge summary for a list of discharge medications.  Relevant Imaging Results:  Relevant Lab Results:   Additional Information SS# [id number]  Su Hilt, RN

## 2019-07-17 NOTE — Progress Notes (Signed)
Physical Therapy Treatment Patient Details Name: Alisha Winters MRN: [id number] DOB: 1932/10/17 Today's Date: 07/17/2019    History of Present Illness Pt is an 86 yo female s/p cannulated R hip pinning PMH of HTN, depression, CVA, DM, cancer.    PT Comments    Pt was seated up in recliner since earlier PT session. She agrees to PT session and request to return to bed 2/2, " My butt is getting sore." Therapist demonstrated prior to pt performing, slide board transfer. She required max assist of one to perform slide board towards L with max assist to place slide board  And perform transfer to bed. Max vcs and tactile cues for technique, sequencing, and adhering to proper wt bearing restrictions. Pt then required max assist to return/reposition supine in bed from EOB sitting. Sao2 >90 % on 3.5 L o2 Jansen. She does have several occasions of requiring increased time and breathing techniques to control SOB but with time is able to slow RR.Overall pt tolerated session well. She will require continued skilled PT to address deficits with strength, balance, endurance, and overall safe functional mobility. PT recommends DC to SNF to assist pt to returning to PLOF. Pt was supine in bed with HOB elevated ~ 30 degrees, bed alarm in place, call bell in reach, and bucks traction re-applied. Acute PT will continue to follow per POC.    Follow Up Recommendations  SNF     Equipment Recommendations  Rolling walker with 5" wheels;3in1 (PT)    Recommendations for Other Services       Precautions / Restrictions Precautions Precautions: Fall Restrictions Weight Bearing Restrictions: Yes RLE Weight Bearing: Non weight bearing (per chat message Dr. Sabra Heck " If she can adhere to TTWB") Other Position/Activity Restrictions: Per MD, allowed to be TTWB however pt currently unable to adhere so MD request to be NWB. bucks traction when in bed    Mobility  Bed Mobility Overal bed mobility: Needs Assistance Bed  Mobility: Sit to Supine       Sit to supine: Max assist;HOB elevated   General bed mobility comments: Max assist to return to supine from EOB sitting 2/2 to pain/fatigue. pt is fatigued SOB from slideboard transfer form recliner to EOB.  Transfers Overall transfer level: Needs assistance Equipment used: Sliding board Transfers: Lateral/Scoot Transfers          Lateral/Scoot Transfers: Max assist;With slide board;+2 safety/equipment General transfer comment: Pt required max assist of one to transfer from recliner to EOB. max assist + vcs for technique sequencing and safety  Ambulation/Gait             General Gait Details: unable/unsafe at this time.   Stairs             Wheelchair Mobility    Modified Rankin (Stroke Patients Only)       Balance Overall balance assessment: Needs assistance Sitting-balance support: Bilateral upper extremity supported;Feet supported Sitting balance-Leahy Scale: Fair Sitting balance - Comments: CGA-min assist to maintain balance EOB with BUE support and +1 LE support       Standing balance comment: unable to test                            Cognition Arousal/Alertness: Awake/alert Behavior During Therapy: Anxious;WFL for tasks assessed/performed Overall Cognitive Status: Within Functional Limits for tasks assessed  General Comments: Pt is A and O x 4 and agreeable to PT session. she is slightly HOH but follows commands consistently throughout      Exercises      General Comments        Pertinent Vitals/Pain Pain Assessment: No/denies pain Faces Pain Scale: No hurt (pt reports pain only with movement (6/10 at times)) Pain Location: R leg Pain Descriptors / Indicators: Grimacing;Moaning;Crying;Sore Pain Intervention(s): Limited activity within patient's tolerance;Monitored during session;Premedicated before session;Repositioned    Home Living                       Prior Function            PT Goals (current goals can now be found in the care plan section) Acute Rehab PT Goals Patient Stated Goal: to get better and go home Progress towards PT goals: Progressing toward goals    Frequency    BID      PT Plan Current plan remains appropriate    Co-evaluation              AM-PAC PT "6 Clicks" Mobility   Outcome Measure  Help needed turning from your back to your side while in a flat bed without using bedrails?: A Lot Help needed moving from lying on your back to sitting on the side of a flat bed without using bedrails?: Total Help needed moving to and from a bed to a chair (including a wheelchair)?: Total Help needed standing up from a chair using your arms (e.g., wheelchair or bedside chair)?: Total Help needed to walk in hospital room?: Total Help needed climbing 3-5 steps with a railing? : Total 6 Click Score: 7    End of Session Equipment Utilized During Treatment: Gait belt;Oxygen Activity Tolerance: Patient limited by fatigue;Patient limited by pain Patient left: in bed;with call bell/phone within reach;with bed alarm set;with family/visitor present;with SCD's reapplied (bucks traction reapplied) Nurse Communication: Mobility status PT Visit Diagnosis: Other abnormalities of gait and mobility (R26.89);Muscle weakness (generalized) (M62.81);Difficulty in walking, not elsewhere classified (R26.2);Pain Pain - Right/Left: Right Pain - part of body: Hip     Time: 1515-1540 PT Time Calculation (min) (ACUTE ONLY): 25 min  Charges:  $Therapeutic Activity: 23-37 mins                     Julaine Fusi PTA 07/17/19, 4:18 PM

## 2019-07-17 NOTE — TOC Progression Note (Signed)
Transition of Care (TOC) - Progression Note    Patient Details  Name: Alisha Winters MRN: [id number] Date of Birth: 05/30/1932  Transition of Care (TOC) CM/SW Contact   J , RN Phone Number: 07/17/2019, 2:54 PM  Clinical Narrative:   Met with the patient to discuss Bed offers, she chose [facility], I notified [facility] at [facility], the patient has had Covid Vaccines I faxed insurance auth approval request to [employer] at [phone]         Expected Discharge Plan and Services                                                 Social Determinants of Health (SDOH) Interventions    Readmission Risk Interventions No flowsheet data found.  

## 2019-07-18 DIAGNOSIS — Z794 Long term (current) use of insulin: Secondary | ICD-10-CM

## 2019-07-18 DIAGNOSIS — E119 Type 2 diabetes mellitus without complications: Secondary | ICD-10-CM

## 2019-07-18 DIAGNOSIS — S72001A Fracture of unspecified part of neck of right femur, initial encounter for closed fracture: Secondary | ICD-10-CM

## 2019-07-18 LAB — BASIC METABOLIC PANEL
Anion gap: 6 (ref 5–15)
BUN: 26 mg/dL — ABNORMAL HIGH (ref 8–23)
CO2: 27 mmol/L (ref 22–32)
Calcium: 7.8 mg/dL — ABNORMAL LOW (ref 8.9–10.3)
Chloride: 104 mmol/L (ref 98–111)
Creatinine, Ser: 0.82 mg/dL (ref 0.44–1.00)
GFR calc Af Amer: >60 mL/min (ref >60)
GFR calc non Af Amer: >60 mL/min (ref >60)
Glucose, Bld: 194 mg/dL — ABNORMAL HIGH (ref 70–99)
Potassium: 4.2 mmol/L (ref 3.5–5.1)
Sodium: 137 mmol/L (ref 135–145)

## 2019-07-18 LAB — GLUCOSE, CAPILLARY
Glucose-Capillary: 177 mg/dL — ABNORMAL HIGH (ref 70–99)
Glucose-Capillary: 179 mg/dL — ABNORMAL HIGH (ref 70–99)
Glucose-Capillary: 279 mg/dL — ABNORMAL HIGH (ref 70–99)
Glucose-Capillary: 247 mg/dL — ABNORMAL HIGH (ref 70–99)
Glucose-Capillary: 279 mg/dL — ABNORMAL HIGH (ref 70–99)
Glucose-Capillary: 287 mg/dL — ABNORMAL HIGH (ref 70–99)

## 2019-07-18 LAB — CBC
HCT: 34.3 % — ABNORMAL LOW (ref 36.0–46.0)
Hemoglobin: 11 g/dL — ABNORMAL LOW (ref 12.0–15.0)
MCH: 27 pg (ref 26.0–34.0)
MCHC: 32.1 g/dL (ref 30.0–36.0)
MCV: 84.3 fL (ref 80.0–100.0)
Platelets: 210 10*3/uL (ref 150–400)
RBC: 4.07 MIL/uL (ref 3.87–5.11)
RDW: 16.2 % — ABNORMAL HIGH (ref 11.5–15.5)
WBC: 9.7 10*3/uL (ref 4.0–10.5)
nRBC: 0 % (ref 0.0–0.2)

## 2019-07-18 LAB — PROTIME-INR
INR: 1.6 — ABNORMAL HIGH (ref 0.8–1.2)
Prothrombin Time: 18.6 seconds — ABNORMAL HIGH (ref 11.4–15.2)

## 2019-07-18 MED ORDER — FERROUS SULFATE 325 (65 FE) MG PO TABS: 325.0000 mg | ORAL_TABLET | Freq: Every day | ORAL | 0 refills | Status: DC

## 2019-07-18 MED ORDER — HYDROCODONE-ACETAMINOPHEN 5-325 MG PO TABS: 1.0000 | ORAL_TABLET | ORAL | 0 refills | Status: DC | PRN

## 2019-07-18 MED ORDER — SENNA 8.6 MG PO TABS: 1.0000 | ORAL_TABLET | Freq: Two times a day (BID) | ORAL | 0 refills | Status: DC

## 2019-07-18 MED ORDER — ENOXAPARIN SODIUM 40 MG/0.4ML ~~LOC~~ SOLN: 40.0000 mg | SUBCUTANEOUS | 0 refills | Status: DC

## 2019-07-18 NOTE — TOC Transition Note (Signed)
Transition of Care [facility]) - CM/SW Discharge Note   Patient Details  Name: Alisha Winters MRN: [id number] Date of Birth: 23-Jun-1932  Transition of Care [facility]) CM/SW Contact:  Elease Hashimoto, LCSW Phone Number: 07/18/2019, 3:51 PM   Clinical Narrative:   Pt cleared to go to [facility] today. [facility] to be here at 4:45 pm to transport to facility. DC packet in chart and family in the room and aware of plan. No further follow due to DC today.    Final next level of care: Skilled Nursing Facility Barriers to Discharge: Barriers Resolved   Patient Goals and CMS Choice   CMS Medicare.gov Compare Post Acute Care list provided to:: Patient Choice offered to / list presented to : Patient  Discharge Placement PASRR number recieved: 07/17/19            Patient chooses bed at: [facility] [place] Patient to be transferred to facility by: [facility] Name of family member notified: Jamiee-daughter Patient and family notified of of transfer: 07/18/19  Discharge Plan and Services                                     Social Determinants of Health (SDOH) Interventions     Readmission Risk Interventions No flowsheet data found.

## 2019-07-18 NOTE — Discharge Summary (Signed)
Physician Discharge Summary  Patient ID: Alisha Winters MRN: [id number] DOB/AGE: May 06, 1932 86 y.o.  Admit date: 07/14/2019 Discharge date: 07/18/2019  Admission Diagnoses:  Discharge Diagnoses:  Active Problems:   Closed intertrochanteric fracture of hip, right, initial encounter [facility])   Discharged Condition: good  Hospital Course:  BarbaraSteeleis a 86 y.o.femalewith a known history of afib on Coumadin, HTN, CAD, DM, CVA, anemiapresents to the emergency department for evaluation of right hip pain.. Patient was in a usual state of health until earlier today when she had an episode of vertigo and sustained a mechanical fall with head trauma but no LOC. She comes to the ER with complaint of right hip pain, unable to move, stand or walk.  CT scan showed right femoral neck fracture.  She had a surgery performed on 7/12 with cannulated hip pinning of the right femoral neck fracture.  She has been doing well since surgery.  She has been seen by physical therapy and Occupational Therapy.  At this point, she is medically stable to be transferred to rehab.  #1. Right femoral neck fracture. Status post surgery.  Currently doing well. We will continue prophylactic Lovenox for the next 3 days until warfarin becomes therapeutic.  2.  Paroxysmal atrial fibrillation on warfarin. Rate well controlled.  Warfarin resumed.  Current INR 1.6.  We will continue prophylactic Lovenox for the next few days.  3.  Essential hypertension. Resume home medicines.  4.  Type 2 diabetes. Resume home medicines.  5.  History of CVA.   Consults: orthopedic surgery  Significant Diagnostic Studies:  CT OF THE RIGHT HIP WITHOUT CONTRAST  TECHNIQUE: Multidetector CT imaging of the right hip was performed according to the standard protocol. Multiplanar CT image reconstructions were also generated.  COMPARISON:  None.  FINDINGS: Bones/Joint/Cartilage  There is a transverse fracture of the right  femoral neck with mild posterior angulation. There is foreshortening of the neck. No acetabular fracture. No dislocation of the femoral head.  Ligaments  Suboptimally assessed by CT.  Muscles and Tendons  No intramuscular hematoma.  Soft tissues  Unremarkable  IMPRESSION: Transverse fracture of the right femoral neck with mild posterior angulation and foreshortening of the neck.   Electronically Signed   By: Ulyses Jarred M.D.   On: 07/15/2019 00:20  Treatments: Hip surgery.  Discharge Exam: Blood pressure (!) 156/82, pulse (!) 48, temperature 98.2 F (36.8 C), temperature source Oral, resp. rate 17, height 6' (1.829 m), weight 117.9 kg, SpO2 99 %. General appearance: alert, cooperative and Orientated to place and person Resp: Decreased breathing sounds without crackles. Cardio: regular rate and rhythm, S1, S2 normal, no murmur, click, rub or gallop GI: soft, non-tender; bowel sounds normal; no masses,  no organomegaly Extremities: extremities normal, atraumatic, no cyanosis or edema  Disposition: Discharge disposition: 03-Skilled Nursing Facility       Discharge Instructions    Diet - low sodium heart healthy   Complete by: As directed    Discharge wound care:   Complete by: As directed    Routine dress changes. Follow with orthopedics   Increase activity slowly   Complete by: As directed      Allergies as of 07/18/2019      Reactions   Penicillins Rash   Atorvastatin Diarrhea   Citalopram Nausea Only   Codeine Itching   Meloxicam Nausea Only   Simvastatin Other (See Comments)   Muscle pain   Hydrochlorothiazide Other (See Comments)   Cephalexin Nausea Only  Medication List    STOP taking these medications   ciprofloxacin 500 MG tablet Commonly known as: CIPRO     TAKE these medications   acetaminophen 500 MG tablet Commonly known as: TYLENOL Take 500 mg by mouth at bedtime.   albuterol 108 (90 Base) MCG/ACT inhaler Commonly  known as: VENTOLIN HFA Inhale 2 puffs into the lungs every 6 (six) hours as needed for wheezing.   Align 4 MG Caps Take 1 capsule by mouth daily.   amLODipine 10 MG tablet Commonly known as: NORVASC Take 10 mg by mouth daily.   aspirin 81 MG tablet Take 81 mg by mouth daily.   enoxaparin 40 MG/0.4ML injection Commonly known as: LOVENOX Inject 0.4 mLs (40 mg total) into the skin daily for 3 days. Start taking on: July 19, 2019   ferrous sulfate 325 (65 FE) MG tablet Take 1 tablet (325 mg total) by mouth daily with breakfast. Start taking on: July 19, 2019   FISH OIL PO Take 1 capsule by mouth daily.   furosemide 20 MG tablet Commonly known as: LASIX Take 40 mg by mouth daily.   HYDROcodone-acetaminophen 5-325 MG tablet Commonly known as: NORCO/VICODIN Take 1 tablet by mouth every 4 (four) hours as needed for moderate pain.   insulin NPH-regular Human (70-30) 100 UNIT/ML injection Inject into the skin 2 (two) times daily with a meal.   isosorbide mononitrate 30 MG 24 hr tablet Commonly known as: IMDUR Take 30 mg by mouth daily.   lisinopril 10 MG tablet Commonly known as: ZESTRIL Take 10 mg by mouth daily.   metoprolol succinate 50 MG 24 hr tablet Commonly known as: TOPROL-XL Take 50 mg by mouth daily. Take with or immediately following a meal.   psyllium 58.6 % packet Commonly known as: METAMUCIL Take 1 packet by mouth daily.   senna 8.6 MG Tabs tablet Commonly known as: SENOKOT Take 1 tablet (8.6 mg total) by mouth 2 (two) times daily.   vitamin B-12 1000 MCG tablet Commonly known as: CYANOCOBALAMIN Take 1,000 mcg by mouth daily.   warfarin 5 MG tablet Commonly known as: COUMADIN Take 5 mg by mouth daily.            Discharge Care Instructions  (From admission, onward)         Start     Ordered   07/18/19 0000  Discharge wound care:       Comments: Routine dress changes. Follow with orthopedics   07/18/19 1333          Contact  information for follow-up providers    Corey Skains, MD Follow up in 2 week(s).   Specialty: Cardiology Contact information: [facility] [place] [address] [phone]        Earnestine Leys, MD. Schedule an appointment as soon as possible for a visit in 2 week(s).   Specialty: Orthopedic Surgery Why: Please make an appointment for patient.  Prior to her discharge Contact information: [place] [address] [phone]            Contact information for after-discharge care    Destination    [facility] Preferred [facility]F .   Service: Skilled Nursing Contact information: [address] [place] [phone]                  Signed: Sharen Hones 07/18/2019, 1:33 PM

## 2019-07-18 NOTE — Care Management Important Message (Signed)
Important Message  Patient Details  Name: Alisha Winters MRN: [id number] Date of Birth: [birth year]   Medicare Important Message Given:  Yes     Dannette Calayah 07/18/2019, 12:14 PM

## 2019-07-18 NOTE — TOC Progression Note (Signed)
Transition of Care [facility]) - Progression Note    Patient Details  Name: Alisha Winters MRN: [id number] Date of Birth: 12-13-1932  Transition of Care [facility]) CM/SW [place], RN Phone Number: 07/18/2019, 12:20 PM  Clinical Narrative:     Josem Kaufmann approved [id number], ref number [id number], next review date 7/15, NO covid test needed, will go to [facility]       Expected Discharge Plan and Services                                                 Social Determinants of Health (SDOH) Interventions    Readmission Risk Interventions No flowsheet data found.

## 2019-07-18 NOTE — Progress Notes (Signed)
Physical Therapy Treatment Patient Details Name: Alisha Winters MRN: [id number] DOB: December 20, 1932 Today's Date: 07/18/2019    History of Present Illness Pt is an 86 yo female s/p cannulated R hip pinning PMH of HTN, depression, CVA, DM, cancer.    PT Comments    Pt lying in bed and pleasantly agreed to participate in PT session this morning. Pt performed therex in bed for promotion of RLE strengthening, joint maintenance, and soft tissue flexibility. Pt required mod A for hip ab/add and heel slides secondary to pain and muscle weakness. Mod+2 A for supine to sit for trunk and RLE management. Pt required max+2 A for lateral sliding transfer from bed to chair. Clinician's foot placed under RLE in order to monitor NWB status, which was maintained. Pt continues to require increased assistance for bed mobility and transfers therefore recommendation of STR still recommended.    Follow Up Recommendations  SNF     Equipment Recommendations  Rolling walker with 5" wheels;3in1 (PT)    Recommendations for Other Services       Precautions / Restrictions Precautions Precautions: Fall Restrictions Weight Bearing Restrictions: Yes RLE Weight Bearing: Non weight bearing Other Position/Activity Restrictions: Per MD, allowed to be TTWB however pt currently unable to adhere so MD request to be NWB. bucks traction when in bed    Mobility  Bed Mobility Overal bed mobility: Needs Assistance Bed Mobility: Sit to Supine     Supine to sit: Mod assist;+2 for physical assistance;HOB elevated     General bed mobility comments: Mod+2 A for elevating trunk into sitting, for RLE management to and over EOB and for scooting hips forward.  Transfers Overall transfer level: Needs assistance Equipment used: None Transfers: Lateral/Scoot Transfers          Lateral/Scoot Transfers: Max assist;+2 physical assistance General transfer comment: Max+2 A for scooting hips from bed to chair. Clinician foot  placed under RLE to monitor NWB status.  Ambulation/Gait             General Gait Details: unable/unsafe at this time.   Stairs             Wheelchair Mobility    Modified Rankin (Stroke Patients Only)       Balance Overall balance assessment: Needs assistance Sitting-balance support: Bilateral upper extremity supported;Feet supported Sitting balance-Leahy Scale: Good Sitting balance - Comments: Pt initially required CGA for sitting balance and then able to progress to supervision with BUE support on bed and and BLE feet resting on floor.       Standing balance comment: unable to test                            Cognition Arousal/Alertness: Awake/alert Behavior During Therapy: WFL for tasks assessed/performed Overall Cognitive Status: Within Functional Limits for tasks assessed                                        Exercises Other Exercises Other Exercises: pt performed active AP and QS x 12, AAROM heel slides and hip ab/add x 12    General Comments        Pertinent Vitals/Pain Pain Assessment: 0-10 Pain Score: 5  Pain Location: R leg Pain Descriptors / Indicators: Grimacing;Moaning;Discomfort Pain Intervention(s): Monitored during session;Limited activity within patient's tolerance;Repositioned    Home Living  Prior Function            PT Goals (current goals can now be found in the care plan section) Acute Rehab PT Goals Patient Stated Goal: to get better and go home PT Goal Formulation: With patient Time For Goal Achievement: 07/31/19 Potential to Achieve Goals: Good Progress towards PT goals: Progressing toward goals    Frequency    BID      PT Plan Current plan remains appropriate    Co-evaluation              AM-PAC PT "6 Clicks" Mobility   Outcome Measure  Help needed turning from your back to your side while in a flat bed without using bedrails?: A Lot Help  needed moving from lying on your back to sitting on the side of a flat bed without using bedrails?: Total Help needed moving to and from a bed to a chair (including a wheelchair)?: Total Help needed standing up from a chair using your arms (e.g., wheelchair or bedside chair)?: Total Help needed to walk in hospital room?: Total Help needed climbing 3-5 steps with a railing? : Total 6 Click Score: 7    End of Session Equipment Utilized During Treatment: Gait belt;Oxygen Activity Tolerance: Patient limited by fatigue;Patient limited by pain Patient left: in chair;with call bell/phone within reach;with chair alarm set;with nursing/sitter in room Nurse Communication: Mobility status PT Visit Diagnosis: Other abnormalities of gait and mobility (R26.89);Muscle weakness (generalized) (M62.81);Difficulty in walking, not elsewhere classified (R26.2);Pain Pain - Right/Left: Right Pain - part of body: Hip     Time: 5956-3875 PT Time Calculation (min) (ACUTE ONLY): 25 min  Charges:                       Vale Haven, SPT   Vale Haven 07/18/2019, 10:50 AM

## 2019-07-18 NOTE — Discharge Instructions (Signed)
1.  Follow-up with PCP in 1 week. 2.  Follow-up with orthopedics in 2 weeks.

## 2019-07-18 NOTE — Progress Notes (Signed)
Patient was scheduled for discharge today however when writer called [facility]  to give report was told by [facility] admissions, that patient is not in there system to come this evening that she was unaware. Writer explained that CM at [facility] spoke with a staff member Gerald Stabs 07/17/19 and confirmed patient had a bed to discharge to 07/18/19. Writer notified MD of the issue with discharging to [facility]. MD gave verbal order to discontinue discharge.

## 2019-07-18 NOTE — Progress Notes (Signed)
Subjective: 2 Days Post-Op Procedure(s) (LRB): CANNULATED HIP PINNING (Right)   The patient is doing well.  Pain is better.  She is started limited PT.  [facility] is [facility].  I will see her back in the office in 2 weeks.  Patient reports pain as mild.  Objective:   VITALS:   Vitals:   07/18/19 0737 07/18/19 1515  BP: (!) 156/82 (!) 155/70  Pulse: (!) 48 65  Resp: 17 16  Temp: 98.2 F (36.8 C) 97.6 F (36.4 C)  SpO2: 99% 100%    Neurologically intact Neurovascular intact Sensation intact distally Intact pulses distally Dorsiflexion/Plantar flexion intact  LABS Recent Labs    07/16/19 1341 07/17/19 0446 07/18/19 0420  HGB 12.8 12.3 11.0*  HCT 40.7 37.2 34.3*  WBC 12.4* 11.3* 9.7  PLT 217 215 210    Recent Labs    07/15/19 2255 07/15/19 2255 07/16/19 1341 07/17/19 0446 07/18/19 0420  NA 139  --   --  135 137  K 4.1  --   --  4.2 4.2  BUN 17  --   --  26* 26*  CREATININE 0.76   < > 0.82 0.69 0.82  GLUCOSE 173*  --   --  245* 194*   < > = values in this interval not displayed.    Recent Labs    07/17/19 0446 07/18/19 0420  INR 1.5* 1.6*     Assessment/Plan: 2 Days Post-Op Procedure(s) (LRB): CANNULATED HIP PINNING (Right)   Up with therapy Discharge to SNF   Return to clinic in 2 weeks for exam and x-ray and stitch removal  Continue aspirin and Coumadin.

## 2019-07-19 DIAGNOSIS — S72141A Displaced intertrochanteric fracture of right femur, initial encounter for closed fracture: Secondary | ICD-10-CM

## 2019-07-19 DIAGNOSIS — I10 Essential (primary) hypertension: Secondary | ICD-10-CM

## 2019-07-19 LAB — CBC
HCT: 35.2 % — ABNORMAL LOW (ref 36.0–46.0)
Hemoglobin: 11.7 g/dL — ABNORMAL LOW (ref 12.0–15.0)
MCH: 27.1 pg (ref 26.0–34.0)
MCHC: 33.2 g/dL (ref 30.0–36.0)
MCV: 81.5 fL (ref 80.0–100.0)
Platelets: 223 10*3/uL (ref 150–400)
RBC: 4.32 MIL/uL (ref 3.87–5.11)
RDW: 16.2 % — ABNORMAL HIGH (ref 11.5–15.5)
WBC: 7.8 10*3/uL (ref 4.0–10.5)
nRBC: 0 % (ref 0.0–0.2)

## 2019-07-19 LAB — GLUCOSE, CAPILLARY
Glucose-Capillary: 190 mg/dL — ABNORMAL HIGH (ref 70–99)
Glucose-Capillary: 149 mg/dL — ABNORMAL HIGH (ref 70–99)
Glucose-Capillary: 143 mg/dL — ABNORMAL HIGH (ref 70–99)

## 2019-07-19 LAB — PROTIME-INR
INR: 1.8 — ABNORMAL HIGH (ref 0.8–1.2)
Prothrombin Time: 20.3 seconds — ABNORMAL HIGH (ref 11.4–15.2)

## 2019-07-19 NOTE — TOC Progression Note (Addendum)
Transition of Care [facility]) - Progression Note    Patient Details  Name: Alisha Winters MRN: [id number] Date of Birth: 06-24-1932  Transition [facility] Care [facility]) CM/SW [place], RN Phone Number: 07/19/2019, 10:01 AM  Clinical Narrative:     DC packet on the chart The bedside Nurse will call report for the patient to go to room 708 to [facility], TOC CM called EMS [facility] and they will be here to pick up the patient at 12 Noon to transport to [facility], I called Roselyn Reef the patients daughter to make aware, unable to reach so called Dawn the other daughter, and notified her of the DC    Barriers to Discharge: Barriers Resolved  Expected Discharge Plan and Services           Expected Discharge Date: 07/19/19                                     Social Determinants of Health (SDOH) Interventions    Readmission Risk Interventions No flowsheet data found.

## 2019-07-19 NOTE — Progress Notes (Signed)
Patient is stable and ready for discharge going to [facility] today. Patient is stable for discharge and will be transported via EMS. Patient's belongings packed by family 07/18/19 will be transported with her. Writer called report and spoke with Maudie Mercury, RN nursing supervisor and gave her report on patient and she verbalized understanding and had no questions at this time. Writer provided [employer] with direct # if she has any other questions. Discharge paperwork in packet given to EMS. Also Probation officer called patient's family and spoke with grandson Einar Pheasant to make him aware patient is discharge this morning to [facility].

## 2019-07-19 NOTE — Discharge Summary (Signed)
Physician Discharge Summary  Patient ID: Alisha Winters MRN: [id number] DOB/AGE: October 29, 1932 86 y.o.  Admit date: 07/14/2019 Discharge date: 07/19/2019  Admission Diagnoses:  Discharge Diagnoses:  Active Problems:   Closed intertrochanteric fracture of hip, right, initial encounter [facility])   Discharged Condition: good  Hospital Course:  BarbaraSteeleis a 86 y.o.femalewith a known history of afib on Coumadin, HTN, CAD, DM, CVA, anemiapresents to the emergency department for evaluation of right hip pain.. Patient was in a usual state of health until earlier today when she had an episode of vertigo and sustained a mechanical fall with head trauma but no LOC. She comes to the ER with complaint of right hip pain, unable to move, stand or walk.  CT scan showed right femoral neck fracture.  She had a surgery performed on 7/12 with cannulated hip pinning of the right femoral neck fracture.  She has been doing well since surgery.  She has been seen by physical therapy and Occupational Therapy.  At this point, she is medically stable to be transferred to rehab.  #1. Right femoral neck fracture. Status post surgery.  Currently doing well. We will continue prophylactic Lovenox for the next 3 days until warfarin becomes therapeutic.  2.  Paroxysmal atrial fibrillation on warfarin. Rate well controlled.  Warfarin resumed.  Current INR 1.8.  We will continue prophylactic Lovenox for the next few days.  3.  Essential hypertension. Resume home medicines.  4.  Type 2 diabetes. Resume home medicines.  5.  History of CVA.  7/15.  Patient was discharged yesterday to nursing home.  I was notified by nurse, nursing home staff refused to accept the patient yesterday.  There is no change in patient condition today, she is still stable to be discharged.   Consults: orthopedic surgery  Significant Diagnostic Studies:  CT OF THE RIGHT HIP WITHOUT CONTRAST  TECHNIQUE: Multidetector CT  imaging of the right hip was performed according to the standard protocol. Multiplanar CT image reconstructions were also generated.  COMPARISON: None.  FINDINGS: Bones/Joint/Cartilage  There is a transverse fracture of the right femoral neck with mild posterior angulation. There is foreshortening of the neck. No acetabular fracture. No dislocation of the femoral head.  Ligaments  Suboptimally assessed by CT.  Muscles and Tendons  No intramuscular hematoma.  Soft tissues  Unremarkable  IMPRESSION: Transverse fracture of the right femoral neck with mild posterior angulation and foreshortening of the neck.   Electronically Signed By: Ulyses Jarred M.D. On: 07/15/2019 00:20  Treatments: Hip surgery.    Discharge Exam: Blood pressure (!) 157/85, pulse 80, temperature 97.9 F (36.6 C), resp. rate 17, height 6' (1.829 m), weight 117.9 kg, SpO2 91 %. General appearance: alert, cooperative and Oriented to time place and person. Resp: clear to auscultation bilaterally Cardio: regular rate and rhythm, S1, S2 normal, no murmur, click, rub or gallop GI: soft, non-tender; bowel sounds normal; no masses,  no organomegaly Extremities: extremities normal, atraumatic, no cyanosis or edema  Disposition: Discharge disposition: 03-Skilled Nursing Facility       Discharge Instructions    Diet - low sodium heart healthy   Complete by: As directed    Discharge wound care:   Complete by: As directed    Routine dress changes. Follow with orthopedics   Discharge wound care:   Complete by: As directed    Daily dressing change, follow with orthopedics   Increase activity slowly   Complete by: As directed    Increase activity slowly   Complete  by: As directed      Allergies as of 07/19/2019      Reactions   Penicillins Rash   Atorvastatin Diarrhea   Citalopram Nausea Only   Codeine Itching   Meloxicam Nausea Only   Simvastatin Other (See Comments)    Muscle pain   Hydrochlorothiazide Other (See Comments)   Cephalexin Nausea Only      Medication List    STOP taking these medications   ciprofloxacin 500 MG tablet Commonly known as: CIPRO     TAKE these medications   acetaminophen 500 MG tablet Commonly known as: TYLENOL Take 500 mg by mouth at bedtime.   albuterol 108 (90 Base) MCG/ACT inhaler Commonly known as: VENTOLIN HFA Inhale 2 puffs into the lungs every 6 (six) hours as needed for wheezing.   Align 4 MG Caps Take 1 capsule by mouth daily.   amLODipine 10 MG tablet Commonly known as: NORVASC Take 10 mg by mouth daily.   aspirin 81 MG tablet Take 81 mg by mouth daily.   enoxaparin 40 MG/0.4ML injection Commonly known as: LOVENOX Inject 0.4 mLs (40 mg total) into the skin daily for 3 days.   ferrous sulfate 325 (65 FE) MG tablet Take 1 tablet (325 mg total) by mouth daily with breakfast.   FISH OIL PO Take 1 capsule by mouth daily.   furosemide 20 MG tablet Commonly known as: LASIX Take 40 mg by mouth daily.   HYDROcodone-acetaminophen 5-325 MG tablet Commonly known as: NORCO/VICODIN Take 1 tablet by mouth every 4 (four) hours as needed for moderate pain.   insulin NPH-regular Human (70-30) 100 UNIT/ML injection Inject into the skin 2 (two) times daily with a meal.   isosorbide mononitrate 30 MG 24 hr tablet Commonly known as: IMDUR Take 30 mg by mouth daily.   lisinopril 10 MG tablet Commonly known as: ZESTRIL Take 10 mg by mouth daily.   metoprolol succinate 50 MG 24 hr tablet Commonly known as: TOPROL-XL Take 50 mg by mouth daily. Take with or immediately following a meal.   psyllium 58.6 % packet Commonly known as: METAMUCIL Take 1 packet by mouth daily.   senna 8.6 MG Tabs tablet Commonly known as: SENOKOT Take 1 tablet (8.6 mg total) by mouth 2 (two) times daily.   vitamin B-12 1000 MCG tablet Commonly known as: CYANOCOBALAMIN Take 1,000 mcg by mouth daily.   warfarin 5 MG  tablet Commonly known as: COUMADIN Take 5 mg by mouth daily.            Discharge Care Instructions  (From admission, onward)         Start     Ordered   07/19/19 0000  Discharge wound care:       Comments: Daily dressing change, follow with orthopedics   07/19/19 0828   07/18/19 0000  Discharge wound care:       Comments: Routine dress changes. Follow with orthopedics   07/18/19 1333          Contact information for follow-up providers    Corey Skains, MD On 08/02/2019.   Specialty: Cardiology Why: @ 10:30 am Contact information: [address] [facility] [place] [address] [phone]        Earnestine Leys, MD. Schedule an appointment as soon as possible for a visit on 08/02/2019.   Specialty: Orthopedic Surgery Why: Please make an appointment for patient.  Prior to her discharge;  @ 2:00 pm Contact information: [address]  [place] [address] [phone]            Contact information for after-discharge care    Destination    [facility] Preferred SNF.   Service: Skilled Nursing Contact information: [address] [place] [phone]                  Signed: Sharen Hones 07/19/2019, 8:28 AM

## 2019-07-19 NOTE — Progress Notes (Signed)
Physical Therapy Treatment Patient Details Name: Alisha Winters MRN: [id number] DOB: 04-09-1932 Today's Date: 07/19/2019    History of Present Illness Pt is an 86 yo female s/p cannulated R hip pinning PMH of HTN, depression, CVA, DM, cancer.    PT Comments    Treatment session emphasis on R LE strength/ROM this session.  Improved tolerance for ROM, improved ability to actively assist with therex.  Follow Up Recommendations  SNF     Equipment Recommendations  Rolling walker with 5" wheels;3in1 (PT)    Recommendations for Other Services       Precautions / Restrictions Precautions Precautions: Fall Restrictions Weight Bearing Restrictions: Yes RLE Weight Bearing: Non weight bearing Other Position/Activity Restrictions: Per MD, allowed to be TTWB however pt currently unable to adhere so MD request to be NWB. bucks traction when in bed    Mobility  Bed Mobility                  Transfers                    Ambulation/Gait                 Stairs             Wheelchair Mobility    Modified Rankin (Stroke Patients Only)       Balance                                            Cognition Arousal/Alertness: Awake/alert Behavior During Therapy: WFL for tasks assessed/performed Overall Cognitive Status: Within Functional Limits for tasks assessed                                        Exercises Other Exercises Other Exercises: R LE supine therex, 1x15, act assist ROM: ankle pumps, quad sets, heel slides, hip abduct/adduct.  Improved tolerance for ROM, improved ability to actively assist with therex.    General Comments        Pertinent Vitals/Pain Pain Assessment: Faces Faces Pain Scale: Hurts little more Pain Location: R leg Pain Descriptors / Indicators: Grimacing;Moaning;Discomfort Pain Intervention(s): Limited activity within patient's tolerance;Monitored during session;Repositioned     Home Living                      Prior Function            PT Goals (current goals can now be found in the care plan section) Acute Rehab PT Goals Patient Stated Goal: to get better and go home PT Goal Formulation: With patient Time For Goal Achievement: 07/31/19 Potential to Achieve Goals: Good Progress towards PT goals: Progressing toward goals    Frequency    BID      PT Plan Current plan remains appropriate    Co-evaluation              AM-PAC PT "6 Clicks" Mobility   Outcome Measure  Help needed turning from your back to your side while in a flat bed without using bedrails?: A Lot Help needed moving from lying on your back to sitting on the side of a flat bed without using bedrails?: A Lot Help needed moving to and from a bed to a chair (including a wheelchair)?:  Total Help needed standing up from a chair using your arms (e.g., wheelchair or bedside chair)?: Total Help needed to walk in hospital room?: Total Help needed climbing 3-5 steps with a railing? : Total 6 Click Score: 8    End of Session Equipment Utilized During Treatment: Gait belt Activity Tolerance: Patient tolerated treatment well Patient left: in bed;with call bell/phone within reach;with bed alarm set Nurse Communication: Mobility status PT Visit Diagnosis: Other abnormalities of gait and mobility (R26.89);Muscle weakness (generalized) (M62.81);Difficulty in walking, not elsewhere classified (R26.2);Pain Pain - Right/Left: Right Pain - part of body: Hip     Time: 9324-1991 PT Time Calculation (min) (ACUTE ONLY): 17 min  Charges:  $Therapeutic Exercise: 8-22 mins                     Maizy Davanzo H. Owens Shark, PT, DPT, NCS 07/19/19, 9:55 AM [phone]

## 2019-07-20 LAB — GLUCOSE, CAPILLARY: Glucose-Capillary: 278 mg/dL — ABNORMAL HIGH (ref 70–99)

## 2019-09-11 DIAGNOSIS — I10 Essential (primary) hypertension: Secondary | ICD-10-CM | POA: Diagnosis not present

## 2019-09-11 DIAGNOSIS — F329 Major depressive disorder, single episode, unspecified: Secondary | ICD-10-CM | POA: Diagnosis not present

## 2019-09-11 DIAGNOSIS — I2510 Atherosclerotic heart disease of native coronary artery without angina pectoris: Secondary | ICD-10-CM | POA: Diagnosis not present

## 2019-09-11 DIAGNOSIS — E569 Vitamin deficiency, unspecified: Secondary | ICD-10-CM | POA: Diagnosis not present

## 2019-09-11 DIAGNOSIS — D649 Anemia, unspecified: Secondary | ICD-10-CM | POA: Diagnosis not present

## 2019-09-11 DIAGNOSIS — I4892 Unspecified atrial flutter: Secondary | ICD-10-CM | POA: Diagnosis not present

## 2019-09-11 DIAGNOSIS — S72141D Displaced intertrochanteric fracture of right femur, subsequent encounter for closed fracture with routine healing: Secondary | ICD-10-CM | POA: Diagnosis not present

## 2019-09-11 DIAGNOSIS — I480 Paroxysmal atrial fibrillation: Secondary | ICD-10-CM | POA: Diagnosis not present

## 2019-09-11 DIAGNOSIS — E1140 Type 2 diabetes mellitus with diabetic neuropathy, unspecified: Secondary | ICD-10-CM | POA: Diagnosis not present

## 2019-09-13 DIAGNOSIS — S72141D Displaced intertrochanteric fracture of right femur, subsequent encounter for closed fracture with routine healing: Secondary | ICD-10-CM | POA: Diagnosis not present

## 2019-09-13 DIAGNOSIS — I10 Essential (primary) hypertension: Secondary | ICD-10-CM | POA: Diagnosis not present

## 2019-09-13 DIAGNOSIS — I4892 Unspecified atrial flutter: Secondary | ICD-10-CM | POA: Diagnosis not present

## 2019-09-13 DIAGNOSIS — I2510 Atherosclerotic heart disease of native coronary artery without angina pectoris: Secondary | ICD-10-CM | POA: Diagnosis not present

## 2019-09-13 DIAGNOSIS — E569 Vitamin deficiency, unspecified: Secondary | ICD-10-CM | POA: Diagnosis not present

## 2019-09-13 DIAGNOSIS — D649 Anemia, unspecified: Secondary | ICD-10-CM | POA: Diagnosis not present

## 2019-09-13 DIAGNOSIS — F329 Major depressive disorder, single episode, unspecified: Secondary | ICD-10-CM | POA: Diagnosis not present

## 2019-09-13 DIAGNOSIS — I480 Paroxysmal atrial fibrillation: Secondary | ICD-10-CM | POA: Diagnosis not present

## 2019-09-13 DIAGNOSIS — E1140 Type 2 diabetes mellitus with diabetic neuropathy, unspecified: Secondary | ICD-10-CM | POA: Diagnosis not present

## 2019-09-14 DIAGNOSIS — I4892 Unspecified atrial flutter: Secondary | ICD-10-CM | POA: Diagnosis not present

## 2019-09-14 DIAGNOSIS — E569 Vitamin deficiency, unspecified: Secondary | ICD-10-CM | POA: Diagnosis not present

## 2019-09-14 DIAGNOSIS — F329 Major depressive disorder, single episode, unspecified: Secondary | ICD-10-CM | POA: Diagnosis not present

## 2019-09-14 DIAGNOSIS — I10 Essential (primary) hypertension: Secondary | ICD-10-CM | POA: Diagnosis not present

## 2019-09-14 DIAGNOSIS — I2510 Atherosclerotic heart disease of native coronary artery without angina pectoris: Secondary | ICD-10-CM | POA: Diagnosis not present

## 2019-09-14 DIAGNOSIS — S72141D Displaced intertrochanteric fracture of right femur, subsequent encounter for closed fracture with routine healing: Secondary | ICD-10-CM | POA: Diagnosis not present

## 2019-09-14 DIAGNOSIS — D649 Anemia, unspecified: Secondary | ICD-10-CM | POA: Diagnosis not present

## 2019-09-14 DIAGNOSIS — E1140 Type 2 diabetes mellitus with diabetic neuropathy, unspecified: Secondary | ICD-10-CM | POA: Diagnosis not present

## 2019-09-14 DIAGNOSIS — I480 Paroxysmal atrial fibrillation: Secondary | ICD-10-CM | POA: Diagnosis not present

## 2019-09-17 DIAGNOSIS — S72141D Displaced intertrochanteric fracture of right femur, subsequent encounter for closed fracture with routine healing: Secondary | ICD-10-CM | POA: Diagnosis not present

## 2019-09-17 DIAGNOSIS — I10 Essential (primary) hypertension: Secondary | ICD-10-CM | POA: Diagnosis not present

## 2019-09-17 DIAGNOSIS — D649 Anemia, unspecified: Secondary | ICD-10-CM | POA: Diagnosis not present

## 2019-09-17 DIAGNOSIS — I480 Paroxysmal atrial fibrillation: Secondary | ICD-10-CM | POA: Diagnosis not present

## 2019-09-17 DIAGNOSIS — E569 Vitamin deficiency, unspecified: Secondary | ICD-10-CM | POA: Diagnosis not present

## 2019-09-17 DIAGNOSIS — I4892 Unspecified atrial flutter: Secondary | ICD-10-CM | POA: Diagnosis not present

## 2019-09-17 DIAGNOSIS — E1140 Type 2 diabetes mellitus with diabetic neuropathy, unspecified: Secondary | ICD-10-CM | POA: Diagnosis not present

## 2019-09-17 DIAGNOSIS — F329 Major depressive disorder, single episode, unspecified: Secondary | ICD-10-CM | POA: Diagnosis not present

## 2019-09-17 DIAGNOSIS — I2510 Atherosclerotic heart disease of native coronary artery without angina pectoris: Secondary | ICD-10-CM | POA: Diagnosis not present

## 2019-09-19 DIAGNOSIS — D649 Anemia, unspecified: Secondary | ICD-10-CM | POA: Diagnosis not present

## 2019-09-19 DIAGNOSIS — I10 Essential (primary) hypertension: Secondary | ICD-10-CM | POA: Diagnosis not present

## 2019-09-19 DIAGNOSIS — E1140 Type 2 diabetes mellitus with diabetic neuropathy, unspecified: Secondary | ICD-10-CM | POA: Diagnosis not present

## 2019-09-19 DIAGNOSIS — E569 Vitamin deficiency, unspecified: Secondary | ICD-10-CM | POA: Diagnosis not present

## 2019-09-19 DIAGNOSIS — S72141D Displaced intertrochanteric fracture of right femur, subsequent encounter for closed fracture with routine healing: Secondary | ICD-10-CM | POA: Diagnosis not present

## 2019-09-19 DIAGNOSIS — I4892 Unspecified atrial flutter: Secondary | ICD-10-CM | POA: Diagnosis not present

## 2019-09-19 DIAGNOSIS — I480 Paroxysmal atrial fibrillation: Secondary | ICD-10-CM | POA: Diagnosis not present

## 2019-09-19 DIAGNOSIS — F329 Major depressive disorder, single episode, unspecified: Secondary | ICD-10-CM | POA: Diagnosis not present

## 2019-09-19 DIAGNOSIS — I2510 Atherosclerotic heart disease of native coronary artery without angina pectoris: Secondary | ICD-10-CM | POA: Diagnosis not present

## 2019-09-21 DIAGNOSIS — I10 Essential (primary) hypertension: Secondary | ICD-10-CM | POA: Diagnosis not present

## 2019-09-21 DIAGNOSIS — I4892 Unspecified atrial flutter: Secondary | ICD-10-CM | POA: Diagnosis not present

## 2019-09-21 DIAGNOSIS — E1140 Type 2 diabetes mellitus with diabetic neuropathy, unspecified: Secondary | ICD-10-CM | POA: Diagnosis not present

## 2019-09-21 DIAGNOSIS — I2510 Atherosclerotic heart disease of native coronary artery without angina pectoris: Secondary | ICD-10-CM | POA: Diagnosis not present

## 2019-09-21 DIAGNOSIS — E569 Vitamin deficiency, unspecified: Secondary | ICD-10-CM | POA: Diagnosis not present

## 2019-09-21 DIAGNOSIS — I480 Paroxysmal atrial fibrillation: Secondary | ICD-10-CM | POA: Diagnosis not present

## 2019-09-21 DIAGNOSIS — S72141D Displaced intertrochanteric fracture of right femur, subsequent encounter for closed fracture with routine healing: Secondary | ICD-10-CM | POA: Diagnosis not present

## 2019-09-21 DIAGNOSIS — D649 Anemia, unspecified: Secondary | ICD-10-CM | POA: Diagnosis not present

## 2019-09-21 DIAGNOSIS — F329 Major depressive disorder, single episode, unspecified: Secondary | ICD-10-CM | POA: Diagnosis not present

## 2019-09-24 DIAGNOSIS — S72141D Displaced intertrochanteric fracture of right femur, subsequent encounter for closed fracture with routine healing: Secondary | ICD-10-CM | POA: Diagnosis not present

## 2019-09-24 DIAGNOSIS — F329 Major depressive disorder, single episode, unspecified: Secondary | ICD-10-CM | POA: Diagnosis not present

## 2019-09-24 DIAGNOSIS — I2510 Atherosclerotic heart disease of native coronary artery without angina pectoris: Secondary | ICD-10-CM | POA: Diagnosis not present

## 2019-09-24 DIAGNOSIS — I480 Paroxysmal atrial fibrillation: Secondary | ICD-10-CM | POA: Diagnosis not present

## 2019-09-24 DIAGNOSIS — I4892 Unspecified atrial flutter: Secondary | ICD-10-CM | POA: Diagnosis not present

## 2019-09-24 DIAGNOSIS — E569 Vitamin deficiency, unspecified: Secondary | ICD-10-CM | POA: Diagnosis not present

## 2019-09-24 DIAGNOSIS — I10 Essential (primary) hypertension: Secondary | ICD-10-CM | POA: Diagnosis not present

## 2019-09-24 DIAGNOSIS — E1140 Type 2 diabetes mellitus with diabetic neuropathy, unspecified: Secondary | ICD-10-CM | POA: Diagnosis not present

## 2019-09-24 DIAGNOSIS — D649 Anemia, unspecified: Secondary | ICD-10-CM | POA: Diagnosis not present

## 2019-09-26 DIAGNOSIS — D649 Anemia, unspecified: Secondary | ICD-10-CM | POA: Diagnosis not present

## 2019-09-26 DIAGNOSIS — I480 Paroxysmal atrial fibrillation: Secondary | ICD-10-CM | POA: Diagnosis not present

## 2019-09-26 DIAGNOSIS — E1140 Type 2 diabetes mellitus with diabetic neuropathy, unspecified: Secondary | ICD-10-CM | POA: Diagnosis not present

## 2019-09-26 DIAGNOSIS — I2510 Atherosclerotic heart disease of native coronary artery without angina pectoris: Secondary | ICD-10-CM | POA: Diagnosis not present

## 2019-09-26 DIAGNOSIS — S72141D Displaced intertrochanteric fracture of right femur, subsequent encounter for closed fracture with routine healing: Secondary | ICD-10-CM | POA: Diagnosis not present

## 2019-09-26 DIAGNOSIS — F329 Major depressive disorder, single episode, unspecified: Secondary | ICD-10-CM | POA: Diagnosis not present

## 2019-09-26 DIAGNOSIS — I10 Essential (primary) hypertension: Secondary | ICD-10-CM | POA: Diagnosis not present

## 2019-09-26 DIAGNOSIS — I4892 Unspecified atrial flutter: Secondary | ICD-10-CM | POA: Diagnosis not present

## 2019-09-26 DIAGNOSIS — E569 Vitamin deficiency, unspecified: Secondary | ICD-10-CM | POA: Diagnosis not present

## 2019-10-01 DIAGNOSIS — S72034A Nondisplaced midcervical fracture of right femur, initial encounter for closed fracture: Secondary | ICD-10-CM | POA: Diagnosis not present

## 2019-10-02 DIAGNOSIS — I482 Chronic atrial fibrillation, unspecified: Secondary | ICD-10-CM | POA: Diagnosis not present

## 2019-10-02 DIAGNOSIS — E7800 Pure hypercholesterolemia, unspecified: Secondary | ICD-10-CM | POA: Diagnosis not present

## 2019-10-02 DIAGNOSIS — I2510 Atherosclerotic heart disease of native coronary artery without angina pectoris: Secondary | ICD-10-CM | POA: Diagnosis not present

## 2019-10-02 DIAGNOSIS — I10 Essential (primary) hypertension: Secondary | ICD-10-CM | POA: Diagnosis not present

## 2019-10-02 DIAGNOSIS — Z23 Encounter for immunization: Secondary | ICD-10-CM | POA: Diagnosis not present

## 2019-10-03 DIAGNOSIS — I10 Essential (primary) hypertension: Secondary | ICD-10-CM | POA: Diagnosis not present

## 2019-10-03 DIAGNOSIS — E569 Vitamin deficiency, unspecified: Secondary | ICD-10-CM | POA: Diagnosis not present

## 2019-10-03 DIAGNOSIS — S72141D Displaced intertrochanteric fracture of right femur, subsequent encounter for closed fracture with routine healing: Secondary | ICD-10-CM | POA: Diagnosis not present

## 2019-10-03 DIAGNOSIS — I4892 Unspecified atrial flutter: Secondary | ICD-10-CM | POA: Diagnosis not present

## 2019-10-03 DIAGNOSIS — I2510 Atherosclerotic heart disease of native coronary artery without angina pectoris: Secondary | ICD-10-CM | POA: Diagnosis not present

## 2019-10-03 DIAGNOSIS — I480 Paroxysmal atrial fibrillation: Secondary | ICD-10-CM | POA: Diagnosis not present

## 2019-10-03 DIAGNOSIS — D649 Anemia, unspecified: Secondary | ICD-10-CM | POA: Diagnosis not present

## 2019-10-03 DIAGNOSIS — E1140 Type 2 diabetes mellitus with diabetic neuropathy, unspecified: Secondary | ICD-10-CM | POA: Diagnosis not present

## 2019-10-03 DIAGNOSIS — F329 Major depressive disorder, single episode, unspecified: Secondary | ICD-10-CM | POA: Diagnosis not present

## 2019-10-04 DIAGNOSIS — D649 Anemia, unspecified: Secondary | ICD-10-CM | POA: Diagnosis not present

## 2019-10-04 DIAGNOSIS — Z79899 Other long term (current) drug therapy: Secondary | ICD-10-CM | POA: Diagnosis not present

## 2019-10-04 DIAGNOSIS — I480 Paroxysmal atrial fibrillation: Secondary | ICD-10-CM | POA: Diagnosis not present

## 2019-10-04 DIAGNOSIS — F329 Major depressive disorder, single episode, unspecified: Secondary | ICD-10-CM | POA: Diagnosis not present

## 2019-10-04 DIAGNOSIS — I10 Essential (primary) hypertension: Secondary | ICD-10-CM | POA: Diagnosis not present

## 2019-10-04 DIAGNOSIS — I2510 Atherosclerotic heart disease of native coronary artery without angina pectoris: Secondary | ICD-10-CM | POA: Diagnosis not present

## 2019-10-04 DIAGNOSIS — E569 Vitamin deficiency, unspecified: Secondary | ICD-10-CM | POA: Diagnosis not present

## 2019-10-04 DIAGNOSIS — I4892 Unspecified atrial flutter: Secondary | ICD-10-CM | POA: Diagnosis not present

## 2019-10-04 DIAGNOSIS — S72141D Displaced intertrochanteric fracture of right femur, subsequent encounter for closed fracture with routine healing: Secondary | ICD-10-CM | POA: Diagnosis not present

## 2019-10-04 DIAGNOSIS — E1140 Type 2 diabetes mellitus with diabetic neuropathy, unspecified: Secondary | ICD-10-CM | POA: Diagnosis not present

## 2019-10-04 DIAGNOSIS — E1165 Type 2 diabetes mellitus with hyperglycemia: Secondary | ICD-10-CM | POA: Diagnosis not present

## 2019-10-05 DIAGNOSIS — F329 Major depressive disorder, single episode, unspecified: Secondary | ICD-10-CM | POA: Diagnosis not present

## 2019-10-05 DIAGNOSIS — E569 Vitamin deficiency, unspecified: Secondary | ICD-10-CM | POA: Diagnosis not present

## 2019-10-05 DIAGNOSIS — S72141D Displaced intertrochanteric fracture of right femur, subsequent encounter for closed fracture with routine healing: Secondary | ICD-10-CM | POA: Diagnosis not present

## 2019-10-05 DIAGNOSIS — E1140 Type 2 diabetes mellitus with diabetic neuropathy, unspecified: Secondary | ICD-10-CM | POA: Diagnosis not present

## 2019-10-05 DIAGNOSIS — I10 Essential (primary) hypertension: Secondary | ICD-10-CM | POA: Diagnosis not present

## 2019-10-05 DIAGNOSIS — I480 Paroxysmal atrial fibrillation: Secondary | ICD-10-CM | POA: Diagnosis not present

## 2019-10-05 DIAGNOSIS — I2510 Atherosclerotic heart disease of native coronary artery without angina pectoris: Secondary | ICD-10-CM | POA: Diagnosis not present

## 2019-10-05 DIAGNOSIS — D649 Anemia, unspecified: Secondary | ICD-10-CM | POA: Diagnosis not present

## 2019-10-05 DIAGNOSIS — I4892 Unspecified atrial flutter: Secondary | ICD-10-CM | POA: Diagnosis not present

## 2019-10-11 DIAGNOSIS — I110 Hypertensive heart disease with heart failure: Secondary | ICD-10-CM | POA: Diagnosis not present

## 2019-10-11 DIAGNOSIS — Z79899 Other long term (current) drug therapy: Secondary | ICD-10-CM | POA: Diagnosis not present

## 2019-10-11 DIAGNOSIS — Z23 Encounter for immunization: Secondary | ICD-10-CM | POA: Diagnosis not present

## 2019-10-11 DIAGNOSIS — Z794 Long term (current) use of insulin: Secondary | ICD-10-CM | POA: Diagnosis not present

## 2019-10-11 DIAGNOSIS — L8990 Pressure ulcer of unspecified site, unspecified stage: Secondary | ICD-10-CM | POA: Diagnosis not present

## 2019-10-11 DIAGNOSIS — I509 Heart failure, unspecified: Secondary | ICD-10-CM | POA: Diagnosis not present

## 2019-10-11 DIAGNOSIS — E119 Type 2 diabetes mellitus without complications: Secondary | ICD-10-CM | POA: Diagnosis not present

## 2019-10-11 DIAGNOSIS — I4891 Unspecified atrial fibrillation: Secondary | ICD-10-CM | POA: Diagnosis not present

## 2019-10-15 DIAGNOSIS — E1140 Type 2 diabetes mellitus with diabetic neuropathy, unspecified: Secondary | ICD-10-CM | POA: Diagnosis not present

## 2019-10-15 DIAGNOSIS — I2510 Atherosclerotic heart disease of native coronary artery without angina pectoris: Secondary | ICD-10-CM | POA: Diagnosis not present

## 2019-10-15 DIAGNOSIS — I10 Essential (primary) hypertension: Secondary | ICD-10-CM | POA: Diagnosis not present

## 2019-10-15 DIAGNOSIS — F329 Major depressive disorder, single episode, unspecified: Secondary | ICD-10-CM | POA: Diagnosis not present

## 2019-10-15 DIAGNOSIS — I480 Paroxysmal atrial fibrillation: Secondary | ICD-10-CM | POA: Diagnosis not present

## 2019-10-15 DIAGNOSIS — S72141D Displaced intertrochanteric fracture of right femur, subsequent encounter for closed fracture with routine healing: Secondary | ICD-10-CM | POA: Diagnosis not present

## 2019-10-15 DIAGNOSIS — I4892 Unspecified atrial flutter: Secondary | ICD-10-CM | POA: Diagnosis not present

## 2019-10-15 DIAGNOSIS — D649 Anemia, unspecified: Secondary | ICD-10-CM | POA: Diagnosis not present

## 2019-11-07 DIAGNOSIS — I4891 Unspecified atrial fibrillation: Secondary | ICD-10-CM | POA: Diagnosis not present

## 2019-11-12 DIAGNOSIS — S72034A Nondisplaced midcervical fracture of right femur, initial encounter for closed fracture: Secondary | ICD-10-CM | POA: Diagnosis not present

## 2019-12-27 DIAGNOSIS — I4891 Unspecified atrial fibrillation: Secondary | ICD-10-CM | POA: Diagnosis not present

## 2020-03-31 DIAGNOSIS — I482 Chronic atrial fibrillation, unspecified: Secondary | ICD-10-CM | POA: Diagnosis not present

## 2020-03-31 DIAGNOSIS — E7800 Pure hypercholesterolemia, unspecified: Secondary | ICD-10-CM | POA: Diagnosis not present

## 2020-03-31 DIAGNOSIS — I5032 Chronic diastolic (congestive) heart failure: Secondary | ICD-10-CM | POA: Diagnosis not present

## 2020-03-31 DIAGNOSIS — I10 Essential (primary) hypertension: Secondary | ICD-10-CM | POA: Diagnosis not present

## 2020-03-31 DIAGNOSIS — I2510 Atherosclerotic heart disease of native coronary artery without angina pectoris: Secondary | ICD-10-CM | POA: Diagnosis not present

## 2020-05-16 DIAGNOSIS — Z0000 Encounter for general adult medical examination without abnormal findings: Secondary | ICD-10-CM | POA: Diagnosis not present

## 2020-05-16 DIAGNOSIS — Z79899 Other long term (current) drug therapy: Secondary | ICD-10-CM | POA: Diagnosis not present

## 2020-05-16 DIAGNOSIS — I4891 Unspecified atrial fibrillation: Secondary | ICD-10-CM | POA: Diagnosis not present

## 2020-05-16 DIAGNOSIS — E785 Hyperlipidemia, unspecified: Secondary | ICD-10-CM | POA: Diagnosis not present

## 2020-05-16 DIAGNOSIS — Z1331 Encounter for screening for depression: Secondary | ICD-10-CM | POA: Diagnosis not present

## 2020-05-16 DIAGNOSIS — E1140 Type 2 diabetes mellitus with diabetic neuropathy, unspecified: Secondary | ICD-10-CM | POA: Diagnosis not present

## 2020-05-16 DIAGNOSIS — E119 Type 2 diabetes mellitus without complications: Secondary | ICD-10-CM | POA: Diagnosis not present

## 2021-02-15 ENCOUNTER — Emergency Department: Payer: Medicare PPO

## 2021-02-15 ENCOUNTER — Other Ambulatory Visit: Payer: Self-pay

## 2021-02-15 ENCOUNTER — Emergency Department
Admission: EM | Admit: 2021-02-15 | Discharge: 2021-02-16 | Disposition: A | Discharge status: Other Acute Inpatient Hospital | Payer: Medicare PPO | Attending: Emergency Medicine | Admitting: Emergency Medicine

## 2021-02-15 DIAGNOSIS — Z20822 Contact with and (suspected) exposure to covid-19: Secondary | ICD-10-CM | POA: Diagnosis not present

## 2021-02-15 DIAGNOSIS — I63511 Cerebral infarction due to unspecified occlusion or stenosis of right middle cerebral artery: Secondary | ICD-10-CM | POA: Diagnosis not present

## 2021-02-15 DIAGNOSIS — I639 Cerebral infarction, unspecified: Secondary | ICD-10-CM | POA: Diagnosis not present

## 2021-02-15 DIAGNOSIS — I10 Essential (primary) hypertension: Secondary | ICD-10-CM | POA: Insufficient documentation

## 2021-02-15 DIAGNOSIS — E1065 Type 1 diabetes mellitus with hyperglycemia: Secondary | ICD-10-CM | POA: Diagnosis not present

## 2021-02-15 DIAGNOSIS — R791 Abnormal coagulation profile: Secondary | ICD-10-CM | POA: Diagnosis not present

## 2021-02-15 DIAGNOSIS — R410 Disorientation, unspecified: Secondary | ICD-10-CM | POA: Diagnosis not present

## 2021-02-15 DIAGNOSIS — R4182 Altered mental status, unspecified: Secondary | ICD-10-CM | POA: Insufficient documentation

## 2021-02-15 DIAGNOSIS — N390 Urinary tract infection, site not specified: Secondary | ICD-10-CM

## 2021-02-15 DIAGNOSIS — I2510 Atherosclerotic heart disease of native coronary artery without angina pectoris: Secondary | ICD-10-CM | POA: Diagnosis not present

## 2021-02-15 DIAGNOSIS — I6350 Cerebral infarction due to unspecified occlusion or stenosis of unspecified cerebral artery: Secondary | ICD-10-CM | POA: Diagnosis not present

## 2021-02-15 LAB — URINALYSIS, COMPLETE (UACMP) WITH MICROSCOPIC
Bilirubin Urine: NEGATIVE
Glucose, UA: 250 mg/dL — AB
Ketones, ur: NEGATIVE mg/dL
Nitrite: NEGATIVE
Protein, ur: >300 mg/dL — AB
Specific Gravity, Urine: 1.015 (ref 1.005–1.030)
pH: 6 (ref 5.0–8.0)

## 2021-02-15 LAB — COMPREHENSIVE METABOLIC PANEL
ALT: 14 U/L (ref 0–44)
AST: 23 U/L (ref 15–41)
Albumin: 2.7 g/dL — ABNORMAL LOW (ref 3.5–5.0)
Alkaline Phosphatase: 85 U/L (ref 38–126)
Anion gap: 9 (ref 5–15)
BUN: 12 mg/dL (ref 8–23)
CO2: 28 mmol/L (ref 22–32)
Calcium: 8.1 mg/dL — ABNORMAL LOW (ref 8.9–10.3)
Chloride: 99 mmol/L (ref 98–111)
Creatinine, Ser: 0.61 mg/dL (ref 0.44–1.00)
GFR, Estimated: >60 mL/min (ref >60)
Glucose, Bld: 144 mg/dL — ABNORMAL HIGH (ref 70–99)
Potassium: 3.5 mmol/L (ref 3.5–5.1)
Sodium: 136 mmol/L (ref 135–145)
Total Bilirubin: 0.7 mg/dL (ref 0.3–1.2)
Total Protein: 6.7 g/dL (ref 6.5–8.1)

## 2021-02-15 LAB — LACTIC ACID, PLASMA: Lactic Acid, Venous: 1.5 mmol/L (ref 0.5–1.9)

## 2021-02-15 LAB — CBC
HCT: 47.8 % — ABNORMAL HIGH (ref 36.0–46.0)
Hemoglobin: 15.1 g/dL — ABNORMAL HIGH (ref 12.0–15.0)
MCH: 27.2 pg (ref 26.0–34.0)
MCHC: 31.6 g/dL (ref 30.0–36.0)
MCV: 86 fL (ref 80.0–100.0)
Platelets: 269 10*3/uL (ref 150–400)
RBC: 5.56 MIL/uL — ABNORMAL HIGH (ref 3.87–5.11)
RDW: 17.9 % — ABNORMAL HIGH (ref 11.5–15.5)
WBC: 7.6 10*3/uL (ref 4.0–10.5)
nRBC: 0 % (ref 0.0–0.2)

## 2021-02-15 LAB — CBG MONITORING, ED
Glucose-Capillary: 181 mg/dL — ABNORMAL HIGH (ref 70–99)
Glucose-Capillary: 89 mg/dL (ref 70–99)

## 2021-02-15 LAB — RESP PANEL BY RT-PCR (FLU A&B, COVID) ARPGX2
Influenza A by PCR: NEGATIVE
Influenza B by PCR: NEGATIVE
SARS Coronavirus 2 by RT PCR: NEGATIVE

## 2021-02-15 MED ORDER — LEVOFLOXACIN 500 MG PO TABS
500.0000 mg | ORAL_TABLET | Freq: Every day | ORAL | 0 refills | Status: DC
Start: 2021-02-15 — End: 2021-02-21

## 2021-02-15 MED ORDER — LEVOFLOXACIN IN D5W 500 MG/100ML IV SOLN
500.0000 mg | Freq: Once | INTRAVENOUS | Status: AC
  Administered 2021-02-15: 500 mg via INTRAVENOUS
  Filled 2021-02-15: qty 100

## 2021-02-15 NOTE — ED Provider Notes (Addendum)
[facility] Provider Note    Event Date/Time   First MD Initiated Contact with Patient 02/15/21 2014     (approximate)  History   Chief Complaint: Hypoglycemia  HPI  Alisha Winters is a 88 y.o. female with a past medical history of diabetes, prior CVA, hypertension, presents to the emergency department for decreased responsiveness/confusion.  According to EMS they were called out to the patient's home for confusion/altered mental state.  Found the patient be largely unresponsive with a blood glucose of 48.  Patient was given D10 and became awake alert oriented to person and place but not time.  Remains somewhat confused, unclear what her baseline is.  Awaiting family arrival.  Physical Exam   Triage Vital Signs: ED Triage Vitals  Enc Vitals Group     BP 02/15/21 2014 135/88     Pulse Rate 02/15/21 2014 (!) 46     Resp 02/15/21 2014 18     Temp 02/15/21 2014 (!) 95 F (35 C)     Temp Source 02/15/21 2014 Rectal     SpO2 02/15/21 2014 96 %     Weight 02/15/21 2035 235 lb 7.2 oz (106.8 kg)     Height 02/15/21 2035 6' (1.829 m)     Head Circumference --      Peak Flow --      Pain Score 02/15/21 2014 0     Pain Loc --      Pain Edu? --      Excl. in [place]? --     Most recent vital signs: Vitals:   02/15/21 2014  BP: 135/88  Pulse: (!) 46  Resp: 18  Temp: (!) 95 F (35 C)  SpO2: 96%    General: Awake, no distress.  Oriented to person and place only. CV:  Good peripheral perfusion.  Regular rate and rhythm  Resp:  Normal effort.  Equal breath sounds bilaterally.  Abd:  No distention.  Soft, nontender.  No rebound or guarding.    ED Results / Procedures / Treatments   EKG  EKG viewed and interpreted by myself shows atrial fibrillation at 58 bpm with a narrow QRS, normal axis, normal intervals, no concerning ST changes.  RADIOLOGY  I personally reviewed the chest x-ray images, no acute findings on my evaluation. Chest x-ray is negative  by radiology.   MEDICATIONS ORDERED IN ED: Medications - No data to display   IMPRESSION / MDM / [place] / ED COURSE  I reviewed the triage vital signs and the nursing notes.  Patient presents emergency department for altered mental status found to be hypoglycemic with a blood sugar of 48.  Patient given D10 in route to the hospital, 185 blood glucose upon arrival.  Patient found to be bradycardic what appears to be a bradycardia A-fib we will obtain an EKG to further evaluate.  Patient is mildly hypothermic 95 degree rectal temperature.  We will place on a Bair hugger, we will check labs, COVID swab, urine and a lactate.  Differential remains quite broad but would include infectious etiology, medication reaction, metabolic or electrolyte abnormality.  We will continue to closely monitor while awaiting results.  Patient's labs thus far reassuring.  Normal CBC with a normal white blood cell count.  Patient's chemistry is reassuring with a glucose of 144.  Lactate is 1.5 and COVID/flu is negative.  I spoke to the patient's daughter who is her caregiver, they have been out of test strips and  states her glucometer batteries are dead as well.  They have been dosing the insulin based on how the patient feels and not off the blood sugar levels.  They have no plan on how to get additional supplies.  Patient's medical work-up thus far is reassuring.  We will have social work see in the morning to see if they can help with home medical supplies and possible home health.  Patient does appear to be in new onset atrial fibrillation.  We will continue to monitor, we will refer to cardiology upon discharge.  Patient's urinalysis does show urinary tract infection.  We will dose Levaquin given the patient's allergies.  We will discharge on Levaquin if the patient is able to be discharged tomorrow after social work evaluation.  FINAL CLINICAL IMPRESSION(S) / ED DIAGNOSES    Confusion Hypoglycemia   Note:  This document was prepared using Dragon voice recognition software and may include unintentional dictation errors.   Harvest Dark, MD 02/15/21 2219    Harvest Dark, MD 02/15/21 2320

## 2021-02-15 NOTE — ED Notes (Signed)
Radiology at bedside for chest x ray

## 2021-02-15 NOTE — ED Triage Notes (Signed)
EMS were called to home for AMS. Patient found unresponsive with BGL of 48. D10 given and repeat BGL 197 but remains confused. Patient also found to be in Afib with no previous hx.

## 2021-02-15 NOTE — Discharge Instructions (Addendum)
Referral has been given to [facility].  Someone from [facility] will be reaching out within the next 24-48 hours to schedule an initial visit.  Referral has also been made to [facility] for primary care services at home.  Someone from [facility] will be reaching out within the next 24-48 hours to set up services.    Office of patient experience call [phone]- you can call and report any good or bad experiences and any concerns you may have.

## 2021-02-15 NOTE — ED Notes (Addendum)
Patient's daughter arrived and I spoke with her regarding her insulin administeration. Patient states that she has been giving herself her insulin without checking her BGL for the last week because she hasnt had batteries for it. Her daughter states that she has batteries but they havent had test strips because they arent scheduled to get the delivery for it for another 7 days. I explained that the patient could have inadvertently killed herself with her insulin by dosing herself without checking her BGL but thankfully she and her boyfriend had found her unresponsive. Patient's daughter was very defensive initially because she thought that I was saying she was at fault. I explained that that was not the case. I was only trying to identify where the problem was so that I could possibly offer her suggestions or get the right people involved to assist them. I suggested that the daughter call the patient's PCP for resources in getting diabetes help. I also suggested that if they couldn't get strips quickly that they could have the fire dept, that she states is only 2 mi down the road, check her BGL prior to administration. After our long conversation and explaining her [facility] and why she needed it, the labs that we were running and the current plan of care, patient was appreciative. She stated that she would not be staying and only had a friend drop her off long enough to give [place] information and then she was going home to be with her boyfriend. She did provide her phone number [name] [phone], spoke with the provider. The provider updated her on results that he had available and that there was a possibility that the patient might get to go home. Patient's daughter stated that she had no way to get her there and we would need to keep her until tomorrow unless we could pay for ambulance transport. She did ask that we call first.Patient's daughter then left.

## 2021-02-15 NOTE — ED Notes (Addendum)
During hourly rounding, this nurse rechecked patient's BGL and temp. Noted that patient's BGL was 89 so patient was assisted in setting up to eat. Patient was also taken off of the [facility] due to recorded normal temperature. Patient is awake and pleasant but this nurse does note some confusion. She asks the same questions several times such as when she can eat despite being told and where are her teeth. Patient's right INT was removed because it was removed but remained taped in place at some point after she arrived.

## 2021-02-15 NOTE — ED Notes (Signed)
Patient resting comfortably on stretcher in room. RR even and unlabored. Patient remains under [facility] and is very happy with that. Patient requesting assistance with remote for TV. Patient states she feels great. She verbalizes no other needs or assistance at this time.

## 2021-02-15 NOTE — ED Notes (Addendum)
Baer Hugger placed on patient for rectal temp of 95

## 2021-02-16 ENCOUNTER — Encounter: Payer: Self-pay | Admitting: Radiology

## 2021-02-16 ENCOUNTER — Inpatient Hospital Stay (HOSPITAL_COMMUNITY): Payer: Medicare PPO

## 2021-02-16 ENCOUNTER — Encounter (HOSPITAL_COMMUNITY): Payer: Self-pay | Admitting: General Practice

## 2021-02-16 ENCOUNTER — Emergency Department: Payer: Medicare PPO

## 2021-02-16 ENCOUNTER — Encounter (HOSPITAL_COMMUNITY)
Admission: EM | Disposition: A | Discharge status: Hospice | Payer: Self-pay | Source: Other Acute Inpatient Hospital | Attending: Neurology

## 2021-02-16 ENCOUNTER — Ambulatory Visit (HOSPITAL_COMMUNITY): Payer: Medicare PPO

## 2021-02-16 ENCOUNTER — Ambulatory Visit (HOSPITAL_COMMUNITY): Payer: Medicare PPO | Admitting: General Practice

## 2021-02-16 ENCOUNTER — Inpatient Hospital Stay (HOSPITAL_COMMUNITY)
Admission: EM | Admit: 2021-02-16 | Discharge: 2021-02-21 | DRG: 023 | Disposition: A | Discharge status: Hospice | Payer: Medicare PPO | Source: Other Acute Inpatient Hospital | Attending: Neurology | Admitting: Neurology

## 2021-02-16 DIAGNOSIS — Z781 Physical restraint status: Secondary | ICD-10-CM

## 2021-02-16 DIAGNOSIS — Z85038 Personal history of other malignant neoplasm of large intestine: Secondary | ICD-10-CM | POA: Diagnosis not present

## 2021-02-16 DIAGNOSIS — E785 Hyperlipidemia, unspecified: Secondary | ICD-10-CM | POA: Diagnosis present

## 2021-02-16 DIAGNOSIS — I472 Ventricular tachycardia, unspecified: Secondary | ICD-10-CM | POA: Diagnosis not present

## 2021-02-16 DIAGNOSIS — R531 Weakness: Secondary | ICD-10-CM

## 2021-02-16 DIAGNOSIS — I63511 Cerebral infarction due to unspecified occlusion or stenosis of right middle cerebral artery: Secondary | ICD-10-CM | POA: Diagnosis not present

## 2021-02-16 DIAGNOSIS — J9811 Atelectasis: Secondary | ICD-10-CM | POA: Diagnosis present

## 2021-02-16 DIAGNOSIS — Z20822 Contact with and (suspected) exposure to covid-19: Secondary | ICD-10-CM | POA: Diagnosis present

## 2021-02-16 DIAGNOSIS — I63512 Cerebral infarction due to unspecified occlusion or stenosis of left middle cerebral artery: Secondary | ICD-10-CM

## 2021-02-16 DIAGNOSIS — E119 Type 2 diabetes mellitus without complications: Secondary | ICD-10-CM | POA: Diagnosis not present

## 2021-02-16 DIAGNOSIS — I2510 Atherosclerotic heart disease of native coronary artery without angina pectoris: Secondary | ICD-10-CM | POA: Diagnosis present

## 2021-02-16 DIAGNOSIS — L89153 Pressure ulcer of sacral region, stage 3: Secondary | ICD-10-CM | POA: Diagnosis present

## 2021-02-16 DIAGNOSIS — J9601 Acute respiratory failure with hypoxia: Secondary | ICD-10-CM | POA: Diagnosis not present

## 2021-02-16 DIAGNOSIS — F32A Depression, unspecified: Secondary | ICD-10-CM | POA: Diagnosis not present

## 2021-02-16 DIAGNOSIS — Z881 Allergy status to other antibiotic agents status: Secondary | ICD-10-CM

## 2021-02-16 DIAGNOSIS — J9600 Acute respiratory failure, unspecified whether with hypoxia or hypercapnia: Secondary | ICD-10-CM | POA: Diagnosis present

## 2021-02-16 DIAGNOSIS — R4701 Aphasia: Secondary | ICD-10-CM | POA: Diagnosis present

## 2021-02-16 DIAGNOSIS — Z9282 Status post administration of tPA (rtPA) in a different facility within the last 24 hours prior to admission to current facility: Secondary | ICD-10-CM | POA: Diagnosis not present

## 2021-02-16 DIAGNOSIS — Z7189 Other specified counseling: Secondary | ICD-10-CM | POA: Diagnosis not present

## 2021-02-16 DIAGNOSIS — Z4659 Encounter for fitting and adjustment of other gastrointestinal appliance and device: Secondary | ICD-10-CM

## 2021-02-16 DIAGNOSIS — E669 Obesity, unspecified: Secondary | ICD-10-CM | POA: Diagnosis present

## 2021-02-16 DIAGNOSIS — I63412 Cerebral infarction due to embolism of left middle cerebral artery: Secondary | ICD-10-CM | POA: Diagnosis present

## 2021-02-16 DIAGNOSIS — I639 Cerebral infarction, unspecified: Secondary | ICD-10-CM | POA: Diagnosis present

## 2021-02-16 DIAGNOSIS — I10 Essential (primary) hypertension: Secondary | ICD-10-CM

## 2021-02-16 DIAGNOSIS — D649 Anemia, unspecified: Secondary | ICD-10-CM | POA: Diagnosis present

## 2021-02-16 DIAGNOSIS — I6602 Occlusion and stenosis of left middle cerebral artery: Secondary | ICD-10-CM

## 2021-02-16 DIAGNOSIS — I959 Hypotension, unspecified: Secondary | ICD-10-CM | POA: Diagnosis present

## 2021-02-16 DIAGNOSIS — R059 Cough, unspecified: Secondary | ICD-10-CM

## 2021-02-16 DIAGNOSIS — E1165 Type 2 diabetes mellitus with hyperglycemia: Secondary | ICD-10-CM | POA: Diagnosis present

## 2021-02-16 DIAGNOSIS — E1169 Type 2 diabetes mellitus with other specified complication: Secondary | ICD-10-CM

## 2021-02-16 DIAGNOSIS — R29724 NIHSS score 24: Secondary | ICD-10-CM | POA: Diagnosis present

## 2021-02-16 DIAGNOSIS — Z66 Do not resuscitate: Secondary | ICD-10-CM | POA: Diagnosis not present

## 2021-02-16 DIAGNOSIS — I6350 Cerebral infarction due to unspecified occlusion or stenosis of unspecified cerebral artery: Secondary | ICD-10-CM | POA: Diagnosis not present

## 2021-02-16 DIAGNOSIS — F0393 Unspecified dementia, unspecified severity, with mood disturbance: Secondary | ICD-10-CM | POA: Diagnosis present

## 2021-02-16 DIAGNOSIS — Z978 Presence of other specified devices: Secondary | ICD-10-CM

## 2021-02-16 DIAGNOSIS — Z794 Long term (current) use of insulin: Secondary | ICD-10-CM

## 2021-02-16 DIAGNOSIS — Z951 Presence of aortocoronary bypass graft: Secondary | ICD-10-CM

## 2021-02-16 DIAGNOSIS — Z7982 Long term (current) use of aspirin: Secondary | ICD-10-CM

## 2021-02-16 DIAGNOSIS — Z885 Allergy status to narcotic agent status: Secondary | ICD-10-CM

## 2021-02-16 DIAGNOSIS — I952 Hypotension due to drugs: Secondary | ICD-10-CM

## 2021-02-16 DIAGNOSIS — R410 Disorientation, unspecified: Secondary | ICD-10-CM | POA: Diagnosis not present

## 2021-02-16 DIAGNOSIS — L89152 Pressure ulcer of sacral region, stage 2: Secondary | ICD-10-CM | POA: Diagnosis not present

## 2021-02-16 DIAGNOSIS — G9349 Other encephalopathy: Secondary | ICD-10-CM | POA: Diagnosis present

## 2021-02-16 DIAGNOSIS — I6389 Other cerebral infarction: Secondary | ICD-10-CM | POA: Diagnosis not present

## 2021-02-16 DIAGNOSIS — E66811 Obesity, class 1: Secondary | ICD-10-CM

## 2021-02-16 DIAGNOSIS — Z880 Allergy status to penicillin: Secondary | ICD-10-CM

## 2021-02-16 DIAGNOSIS — Z9221 Personal history of antineoplastic chemotherapy: Secondary | ICD-10-CM

## 2021-02-16 DIAGNOSIS — I482 Chronic atrial fibrillation, unspecified: Secondary | ICD-10-CM | POA: Diagnosis present

## 2021-02-16 DIAGNOSIS — Z7901 Long term (current) use of anticoagulants: Secondary | ICD-10-CM

## 2021-02-16 DIAGNOSIS — I159 Secondary hypertension, unspecified: Secondary | ICD-10-CM | POA: Diagnosis not present

## 2021-02-16 DIAGNOSIS — E781 Pure hyperglyceridemia: Secondary | ICD-10-CM

## 2021-02-16 DIAGNOSIS — Z79899 Other long term (current) drug therapy: Secondary | ICD-10-CM

## 2021-02-16 DIAGNOSIS — Z515 Encounter for palliative care: Secondary | ICD-10-CM | POA: Diagnosis not present

## 2021-02-16 DIAGNOSIS — Z6832 Body mass index (BMI) 32.0-32.9, adult: Secondary | ICD-10-CM

## 2021-02-16 DIAGNOSIS — G8194 Hemiplegia, unspecified affecting left nondominant side: Secondary | ICD-10-CM | POA: Diagnosis present

## 2021-02-16 HISTORY — PX: RADIOLOGY WITH ANESTHESIA: SHX6223

## 2021-02-16 HISTORY — PX: IR PERCUTANEOUS ART THROMBECTOMY/INFUSION INTRACRANIAL INC DIAG ANGIO: IMG6087

## 2021-02-16 HISTORY — PX: IR CT HEAD LTD: IMG2386

## 2021-02-16 HISTORY — PX: IR US GUIDE VASC ACCESS RIGHT: IMG2390

## 2021-02-16 HISTORY — DX: Encounter for fitting and adjustment of other gastrointestinal appliance and device: Z46.59

## 2021-02-16 HISTORY — DX: Hypotension due to drugs: I95.2

## 2021-02-16 LAB — COMPREHENSIVE METABOLIC PANEL
ALT: 11 U/L (ref 0–44)
AST: 16 U/L (ref 15–41)
Albumin: 2.2 g/dL — ABNORMAL LOW (ref 3.5–5.0)
Alkaline Phosphatase: 69 U/L (ref 38–126)
Anion gap: 6 (ref 5–15)
BUN: 11 mg/dL (ref 8–23)
CO2: 25 mmol/L (ref 22–32)
Calcium: 7.5 mg/dL — ABNORMAL LOW (ref 8.9–10.3)
Chloride: 100 mmol/L (ref 98–111)
Creatinine, Ser: 0.61 mg/dL (ref 0.44–1.00)
GFR, Estimated: >60 mL/min (ref >60)
Glucose, Bld: 197 mg/dL — ABNORMAL HIGH (ref 70–99)
Potassium: 3.6 mmol/L (ref 3.5–5.1)
Sodium: 131 mmol/L — ABNORMAL LOW (ref 135–145)
Total Bilirubin: 0.5 mg/dL (ref 0.3–1.2)
Total Protein: 5.5 g/dL — ABNORMAL LOW (ref 6.5–8.1)

## 2021-02-16 LAB — URINALYSIS, ROUTINE W REFLEX MICROSCOPIC
Bilirubin Urine: NEGATIVE
Glucose, UA: 50 mg/dL — AB
Hgb urine dipstick: NEGATIVE
Ketones, ur: NEGATIVE mg/dL
Nitrite: NEGATIVE
Protein, ur: 100 mg/dL — AB
Specific Gravity, Urine: 1.042 — ABNORMAL HIGH (ref 1.005–1.030)
WBC, UA: >50 WBC/hpf — ABNORMAL HIGH (ref 0–5)
pH: 6 (ref 5.0–8.0)

## 2021-02-16 LAB — POCT I-STAT 7, (LYTES, BLD GAS, ICA,H+H)
Acid-Base Excess: 5 mmol/L — ABNORMAL HIGH (ref 0.0–2.0)
Bicarbonate: 29.3 mmol/L — ABNORMAL HIGH (ref 20.0–28.0)
Calcium, Ion: 1.1 mmol/L — ABNORMAL LOW (ref 1.15–1.40)
HCT: 38 % (ref 36.0–46.0)
Hemoglobin: 12.9 g/dL (ref 12.0–15.0)
O2 Saturation: 100 %
Potassium: 3.6 mmol/L (ref 3.5–5.1)
Sodium: 136 mmol/L (ref 135–145)
TCO2: 31 mmol/L (ref 22–32)
pCO2 arterial: 40.4 mmHg (ref 32.0–48.0)
pH, Arterial: 7.469 — ABNORMAL HIGH (ref 7.350–7.450)
pO2, Arterial: 469 mmHg — ABNORMAL HIGH (ref 83.0–108.0)

## 2021-02-16 LAB — HEMOGLOBIN A1C
Hgb A1c MFr Bld: 6.2 % — ABNORMAL HIGH (ref 4.8–5.6)
Mean Plasma Glucose: 131.24 mg/dL

## 2021-02-16 LAB — MRSA NEXT GEN BY PCR, NASAL: MRSA by PCR Next Gen: NOT DETECTED

## 2021-02-16 LAB — BASIC METABOLIC PANEL
Anion gap: 8 (ref 5–15)
BUN: 10 mg/dL (ref 8–23)
CO2: 24 mmol/L (ref 22–32)
Calcium: 7.8 mg/dL — ABNORMAL LOW (ref 8.9–10.3)
Chloride: 105 mmol/L (ref 98–111)
Creatinine, Ser: 0.68 mg/dL (ref 0.44–1.00)
GFR, Estimated: >60 mL/min (ref >60)
Glucose, Bld: 221 mg/dL — ABNORMAL HIGH (ref 70–99)
Potassium: 3.7 mmol/L (ref 3.5–5.1)
Sodium: 137 mmol/L (ref 135–145)

## 2021-02-16 LAB — RESP PANEL BY RT-PCR (FLU A&B, COVID) ARPGX2
Influenza A by PCR: NEGATIVE
Influenza B by PCR: NEGATIVE
SARS Coronavirus 2 by RT PCR: NEGATIVE

## 2021-02-16 LAB — URINE DRUG SCREEN, QUALITATIVE (ARMC ONLY)
Amphetamines, Ur Screen: NOT DETECTED
Barbiturates, Ur Screen: NOT DETECTED
Benzodiazepine, Ur Scrn: NOT DETECTED
Cannabinoid 50 Ng, Ur ~~LOC~~: NOT DETECTED
Cocaine Metabolite,Ur ~~LOC~~: NOT DETECTED
MDMA (Ecstasy)Ur Screen: NOT DETECTED
Methadone Scn, Ur: NOT DETECTED
Opiate, Ur Screen: NOT DETECTED
Phencyclidine (PCP) Ur S: NOT DETECTED
Tricyclic, Ur Screen: NOT DETECTED

## 2021-02-16 LAB — CBG MONITORING, ED
Glucose-Capillary: 154 mg/dL — ABNORMAL HIGH (ref 70–99)
Glucose-Capillary: 127 mg/dL — ABNORMAL HIGH (ref 70–99)
Glucose-Capillary: 164 mg/dL — ABNORMAL HIGH (ref 70–99)

## 2021-02-16 LAB — CBC
HCT: 41.1 % (ref 36.0–46.0)
Hemoglobin: 12.9 g/dL (ref 12.0–15.0)
MCH: 27 pg (ref 26.0–34.0)
MCHC: 31.4 g/dL (ref 30.0–36.0)
MCV: 86.2 fL (ref 80.0–100.0)
Platelets: 270 10*3/uL (ref 150–400)
RBC: 4.77 MIL/uL (ref 3.87–5.11)
RDW: 17.6 % — ABNORMAL HIGH (ref 11.5–15.5)
WBC: 6.9 10*3/uL (ref 4.0–10.5)
nRBC: 0 % (ref 0.0–0.2)

## 2021-02-16 LAB — PROCALCITONIN: Procalcitonin: <0.1 ng/mL

## 2021-02-16 LAB — GLUCOSE, CAPILLARY
Glucose-Capillary: 155 mg/dL — ABNORMAL HIGH (ref 70–99)
Glucose-Capillary: 201 mg/dL — ABNORMAL HIGH (ref 70–99)
Glucose-Capillary: 210 mg/dL — ABNORMAL HIGH (ref 70–99)

## 2021-02-16 LAB — PROTIME-INR
INR: 1.3 — ABNORMAL HIGH (ref 0.8–1.2)
Prothrombin Time: 16.1 seconds — ABNORMAL HIGH (ref 11.4–15.2)

## 2021-02-16 LAB — DIFFERENTIAL
Abs Immature Granulocytes: 0.03 10*3/uL (ref 0.00–0.07)
Basophils Absolute: 0 10*3/uL (ref 0.0–0.1)
Basophils Relative: 0 %
Eosinophils Absolute: 0 10*3/uL (ref 0.0–0.5)
Eosinophils Relative: 1 %
Immature Granulocytes: 0 %
Lymphocytes Relative: 24 %
Lymphs Abs: 1.7 10*3/uL (ref 0.7–4.0)
Monocytes Absolute: 0.5 10*3/uL (ref 0.1–1.0)
Monocytes Relative: 8 %
Neutro Abs: 4.6 10*3/uL (ref 1.7–7.7)
Neutrophils Relative %: 67 %

## 2021-02-16 LAB — APTT: aPTT: 29 seconds (ref 24–36)

## 2021-02-16 LAB — MAGNESIUM: Magnesium: 1.9 mg/dL (ref 1.7–2.4)

## 2021-02-16 SURGERY — IR WITH ANESTHESIA
Anesthesia: General

## 2021-02-16 MED ORDER — PROPOFOL 1000 MG/100ML IV EMUL: 5.0000 ug/kg/min | INTRAVENOUS | Status: DC

## 2021-02-16 MED ORDER — PROPOFOL 10 MG/ML IV BOLUS
INTRAVENOUS | Status: DC | PRN
  Administered 2021-02-16: 160 mg via INTRAVENOUS

## 2021-02-16 MED ORDER — LABETALOL HCL 5 MG/ML IV SOLN
10.0000 mg | Freq: Once | INTRAVENOUS | Status: AC
  Administered 2021-02-16: 10 mg via INTRAVENOUS

## 2021-02-16 MED ORDER — CHLORHEXIDINE GLUCONATE CLOTH 2 % EX PADS
6.0000 | MEDICATED_PAD | Freq: Every day | CUTANEOUS | Status: DC
  Administered 2021-02-17 – 2021-02-19 (×3): 6 via TOPICAL

## 2021-02-16 MED ORDER — PHENYLEPHRINE HCL-NACL 20-0.9 MG/250ML-% IV SOLN
INTRAVENOUS | Status: DC | PRN
  Administered 2021-02-16: 25 ug/min via INTRAVENOUS

## 2021-02-16 MED ORDER — SODIUM CHLORIDE 0.9 % IV BOLUS
1000.0000 mL | Freq: Once | INTRAVENOUS | Status: AC
  Administered 2021-02-16: 1000 mL via INTRAVENOUS

## 2021-02-16 MED ORDER — ACETAMINOPHEN 650 MG RE SUPP: 650.0000 mg | RECTAL | Status: DC | PRN

## 2021-02-16 MED ORDER — FENTANYL 2500MCG IN NS 250ML (10MCG/ML) PREMIX INFUSION
0.0000 ug/h | INTRAVENOUS | Status: DC
  Filled 2021-02-16: qty 250

## 2021-02-16 MED ORDER — PANTOPRAZOLE SODIUM 40 MG IV SOLR: 40.0000 mg | Freq: Every day | INTRAVENOUS | Status: DC

## 2021-02-16 MED ORDER — CLEVIDIPINE BUTYRATE 0.5 MG/ML IV EMUL
0.0000 mg/h | INTRAVENOUS | Status: DC
  Administered 2021-02-16: 2 mg/h via INTRAVENOUS
  Administered 2021-02-16: 3 mg/h via INTRAVENOUS
  Administered 2021-02-17 (×2): 4 mg/h via INTRAVENOUS
  Filled 2021-02-16 (×3): qty 50
  Filled 2021-02-16: qty 100

## 2021-02-16 MED ORDER — CHLORHEXIDINE GLUCONATE 0.12% ORAL RINSE (MEDLINE KIT)
15.0000 mL | Freq: Two times a day (BID) | OROMUCOSAL | Status: DC
  Administered 2021-02-16 – 2021-02-18 (×4): 15 mL via OROMUCOSAL

## 2021-02-16 MED ORDER — INSULIN ASPART 100 UNIT/ML IJ SOLN
0.0000 [IU] | INTRAMUSCULAR | Status: DC
  Administered 2021-02-16: 3 [IU] via SUBCUTANEOUS
  Administered 2021-02-16: 5 [IU] via SUBCUTANEOUS
  Administered 2021-02-17: 3 [IU] via SUBCUTANEOUS
  Administered 2021-02-17 (×3): 2 [IU] via SUBCUTANEOUS
  Administered 2021-02-17: 3 [IU] via SUBCUTANEOUS
  Administered 2021-02-18: 2 [IU] via SUBCUTANEOUS
  Administered 2021-02-18: 3 [IU] via SUBCUTANEOUS
  Administered 2021-02-18 (×2): 2 [IU] via SUBCUTANEOUS
  Administered 2021-02-18: 3 [IU] via SUBCUTANEOUS
  Administered 2021-02-18: 2 [IU] via SUBCUTANEOUS
  Administered 2021-02-19: 5 [IU] via SUBCUTANEOUS
  Administered 2021-02-19 (×2): 8 [IU] via SUBCUTANEOUS

## 2021-02-16 MED ORDER — ROCURONIUM BROMIDE 10 MG/ML (PF) SYRINGE
PREFILLED_SYRINGE | INTRAVENOUS | Status: DC | PRN
  Administered 2021-02-16: 40 mg via INTRAVENOUS

## 2021-02-16 MED ORDER — KETAMINE HCL 50 MG/5ML IJ SOSY
PREFILLED_SYRINGE | INTRAMUSCULAR | Status: AC
  Filled 2021-02-16: qty 5

## 2021-02-16 MED ORDER — CLEVIDIPINE BUTYRATE 0.5 MG/ML IV EMUL
INTRAVENOUS | Status: AC
  Filled 2021-02-16: qty 50

## 2021-02-16 MED ORDER — PROPOFOL 1000 MG/100ML IV EMUL: 0.0000 ug/kg/min | INTRAVENOUS | Status: DC

## 2021-02-16 MED ORDER — FENTANYL CITRATE PF 50 MCG/ML IJ SOSY: 50.0000 ug | PREFILLED_SYRINGE | Freq: Once | INTRAMUSCULAR | Status: DC

## 2021-02-16 MED ORDER — TENECTEPLASE FOR STROKE
25.0000 mg | PACK | Freq: Once | INTRAVENOUS | Status: AC
  Administered 2021-02-16: 25 mg via INTRAVENOUS

## 2021-02-16 MED ORDER — POTASSIUM CHLORIDE 20 MEQ PO PACK
40.0000 meq | PACK | Freq: Once | ORAL | Status: AC
  Administered 2021-02-16: 40 meq
  Filled 2021-02-16: qty 2

## 2021-02-16 MED ORDER — FENTANYL 2500MCG IN NS 250ML (10MCG/ML) PREMIX INFUSION: 25.0000 ug/h | INTRAVENOUS | Status: DC

## 2021-02-16 MED ORDER — ROCURONIUM BROMIDE 50 MG/5ML IV SOLN
INTRAVENOUS | Status: DC | PRN
  Administered 2021-02-16: 100 mg via INTRAVENOUS

## 2021-02-16 MED ORDER — ACETAMINOPHEN 325 MG PO TABS: 650.0000 mg | ORAL_TABLET | ORAL | Status: DC | PRN

## 2021-02-16 MED ORDER — LABETALOL HCL 5 MG/ML IV SOLN
INTRAVENOUS | Status: AC
  Filled 2021-02-16: qty 4

## 2021-02-16 MED ORDER — ORAL CARE MOUTH RINSE
15.0000 mL | OROMUCOSAL | Status: DC
  Administered 2021-02-16 – 2021-02-18 (×22): 15 mL via OROMUCOSAL

## 2021-02-16 MED ORDER — MIDAZOLAM HCL 2 MG/2ML IJ SOLN
INTRAMUSCULAR | Status: AC
  Filled 2021-02-16: qty 2

## 2021-02-16 MED ORDER — PANTOPRAZOLE SODIUM 40 MG IV SOLR
40.0000 mg | Freq: Every day | INTRAVENOUS | Status: DC
  Administered 2021-02-16 – 2021-02-19 (×4): 40 mg via INTRAVENOUS
  Filled 2021-02-16 (×4): qty 10

## 2021-02-16 MED ORDER — ACETAMINOPHEN 160 MG/5ML PO SOLN
650.0000 mg | ORAL | Status: DC | PRN
  Administered 2021-02-19: 650 mg

## 2021-02-16 MED ORDER — IOHEXOL 300 MG/ML  SOLN: 100.0000 mL | Freq: Once | INTRAMUSCULAR | Status: DC | PRN

## 2021-02-16 MED ORDER — ETOMIDATE 2 MG/ML IV SOLN
INTRAVENOUS | Status: AC
  Filled 2021-02-16: qty 20

## 2021-02-16 MED ORDER — ROCURONIUM BROMIDE 10 MG/ML (PF) SYRINGE
PREFILLED_SYRINGE | INTRAVENOUS | Status: AC
  Filled 2021-02-16: qty 10

## 2021-02-16 MED ORDER — ONDANSETRON HCL 4 MG/2ML IJ SOLN: 4.0000 mg | Freq: Four times a day (QID) | INTRAMUSCULAR | Status: DC | PRN

## 2021-02-16 MED ORDER — PROPOFOL 500 MG/50ML IV EMUL
INTRAVENOUS | Status: DC | PRN
  Administered 2021-02-16: 50 ug/kg/min via INTRAVENOUS

## 2021-02-16 MED ORDER — MAGNESIUM SULFATE 2 GM/50ML IV SOLN
2.0000 g | Freq: Once | INTRAVENOUS | Status: AC
  Administered 2021-02-16: 2 g via INTRAVENOUS
  Filled 2021-02-16: qty 50

## 2021-02-16 MED ORDER — SODIUM CHLORIDE 0.9 % IV SOLN: INTRAVENOUS | Status: DC

## 2021-02-16 MED ORDER — HYDRALAZINE HCL 25 MG PO TABS
25.0000 mg | ORAL_TABLET | Freq: Four times a day (QID) | ORAL | Status: DC
  Administered 2021-02-16 – 2021-02-17 (×2): 25 mg
  Filled 2021-02-16 (×2): qty 1

## 2021-02-16 MED ORDER — STROKE: EARLY STAGES OF RECOVERY BOOK
Freq: Once | Status: AC
  Filled 2021-02-16: qty 1

## 2021-02-16 MED ORDER — PHENYLEPHRINE HCL-NACL 20-0.9 MG/250ML-% IV SOLN
0.0000 ug/min | INTRAVENOUS | Status: DC
  Administered 2021-02-16: 40 ug/min via INTRAVENOUS

## 2021-02-16 MED ORDER — FENTANYL CITRATE PF 50 MCG/ML IJ SOSY
25.0000 ug | PREFILLED_SYRINGE | INTRAMUSCULAR | Status: DC | PRN
  Administered 2021-02-16: 25 ug via INTRAVENOUS
  Filled 2021-02-16: qty 1

## 2021-02-16 MED ORDER — HYDRALAZINE HCL 10 MG PO TABS: 10.0000 mg | ORAL_TABLET | Freq: Four times a day (QID) | ORAL | Status: DC

## 2021-02-16 MED ORDER — IOHEXOL 350 MG/ML SOLN
75.0000 mL | Freq: Once | INTRAVENOUS | Status: AC | PRN
  Administered 2021-02-16: 75 mL via INTRAVENOUS

## 2021-02-16 MED ORDER — DOCUSATE SODIUM 50 MG/5ML PO LIQD
100.0000 mg | Freq: Two times a day (BID) | ORAL | Status: DC
  Administered 2021-02-17 – 2021-02-20 (×4): 100 mg
  Filled 2021-02-16 (×4): qty 10

## 2021-02-16 MED ORDER — FENTANYL BOLUS VIA INFUSION
25.0000 ug | INTRAVENOUS | Status: DC | PRN
  Filled 2021-02-16: qty 25

## 2021-02-16 MED ORDER — PROPOFOL 1000 MG/100ML IV EMUL
INTRAVENOUS | Status: AC
  Filled 2021-02-16: qty 100

## 2021-02-16 MED ORDER — POLYETHYLENE GLYCOL 3350 17 G PO PACK
17.0000 g | PACK | Freq: Every day | ORAL | Status: DC
  Administered 2021-02-17: 17 g
  Filled 2021-02-16: qty 1

## 2021-02-16 MED ORDER — ATROPINE SULFATE 1 MG/10ML IJ SOSY
PREFILLED_SYRINGE | INTRAMUSCULAR | Status: AC
  Filled 2021-02-16: qty 10

## 2021-02-16 MED ORDER — CLEVIDIPINE BUTYRATE 0.5 MG/ML IV EMUL
0.0000 mg/h | INTRAVENOUS | Status: DC
  Administered 2021-02-16: 2 mg/h via INTRAVENOUS

## 2021-02-16 MED ORDER — ACETAMINOPHEN 160 MG/5ML PO SOLN
650.0000 mg | ORAL | Status: DC | PRN
  Filled 2021-02-16: qty 20.3

## 2021-02-16 MED ORDER — SENNOSIDES-DOCUSATE SODIUM 8.6-50 MG PO TABS: 1.0000 | ORAL_TABLET | Freq: Every evening | ORAL | Status: DC | PRN

## 2021-02-16 MED ORDER — IOHEXOL 300 MG/ML  SOLN
100.0000 mL | Freq: Once | INTRAMUSCULAR | Status: AC | PRN
  Administered 2021-02-16: 64 mL via INTRA_ARTERIAL

## 2021-02-16 MED ORDER — LACTATED RINGERS IV SOLN: INTRAVENOUS | Status: DC | PRN

## 2021-02-16 MED ORDER — FENTANYL CITRATE PF 50 MCG/ML IJ SOSY
25.0000 ug | PREFILLED_SYRINGE | INTRAMUSCULAR | Status: DC | PRN
  Administered 2021-02-16 – 2021-02-17 (×2): 50 ug via INTRAVENOUS
  Administered 2021-02-17: 100 ug via INTRAVENOUS
  Administered 2021-02-18 – 2021-02-19 (×5): 50 ug via INTRAVENOUS
  Filled 2021-02-16 (×2): qty 1
  Filled 2021-02-16 (×2): qty 2
  Filled 2021-02-16 (×4): qty 1

## 2021-02-16 MED ORDER — MIDAZOLAM HCL 2 MG/2ML IJ SOLN
1.0000 mg | INTRAMUSCULAR | Status: DC | PRN
  Administered 2021-02-16 – 2021-02-17 (×2): 1 mg via INTRAVENOUS

## 2021-02-16 MED ORDER — FENTANYL CITRATE PF 50 MCG/ML IJ SOSY
PREFILLED_SYRINGE | INTRAMUSCULAR | Status: AC
  Filled 2021-02-16: qty 2

## 2021-02-16 MED ORDER — MIDAZOLAM HCL 2 MG/2ML IJ SOLN
1.0000 mg | INTRAMUSCULAR | Status: DC | PRN
  Administered 2021-02-17: 1 mg via INTRAVENOUS
  Filled 2021-02-16 (×3): qty 2

## 2021-02-16 MED ORDER — SUCCINYLCHOLINE CHLORIDE 200 MG/10ML IV SOSY
PREFILLED_SYRINGE | INTRAVENOUS | Status: AC
  Filled 2021-02-16: qty 10

## 2021-02-16 NOTE — Progress Notes (Signed)
Unable to assess NIH, pt remains intubated and sedated, under the care of anesthesia.

## 2021-02-16 NOTE — Transfer of Care (Signed)
Immediate Anesthesia Transfer of Care Note  Patient: Alisha Winters  Procedure(s) Performed: IR WITH ANESTHESIA  Patient Location: ICU  Anesthesia Type:General  Level of Consciousness: sedated and Patient remains intubated per anesthesia plan  Airway & Oxygen Therapy: Patient remains intubated per anesthesia plan and Patient placed on Ventilator (see vital sign flow sheet for setting)  Post-op Assessment: Report given to RN and Post -op Vital signs reviewed and stable  Post vital signs: Reviewed and stable  Last Vitals:  Vitals Value Taken Time  BP 123/73 02/16/21 1413  Temp    Pulse 55 02/16/21 1421  Resp 20 02/16/21 1411  SpO2 100 % 02/16/21 1421  Vitals shown include unvalidated device data.  Last Pain: There were no vitals filed for this visit.       Complications: No notable events documented.

## 2021-02-16 NOTE — ED Notes (Signed)
Patient placed on 2L via [place] with improvement of O2 sats from 92% on room air to 98%. Patient placed supine at 30 degrees. Patient with ratling breath sounds. Md Bradler at bedside discussing possible intubation with family.

## 2021-02-16 NOTE — TOC Initial Note (Signed)
Transition of Care [facility]) - Initial/Assessment Note    Patient Details  Name: Alisha Winters MRN: [id number] Date of Birth: [birth year]  Transition of Care [facility]) CM/SW Contact:    Shelbie Hutching, RN Phone Number: 02/16/2021, 10:41 AM  Clinical Narrative:                 Patient brought into the emergency room yesterday for hypoglycemia, found unresponsive at home.  Patient is from home with her daughter.  TOC consult for home health needs. RNCM met with patient and her daughter Alisha Winters at the bedside this morning.  Alisha Winters reports that she is the patient's primary caregiver.  Patient requires assistance with all ADL's, bathing, feeding, dressing, assistance getting to and from the bedside commode.  Alisha Winters reports that she is doing the best she can but it has been hard.  They ran out of test strips for the glucometer, they did reach out to the mail order delivery company that does her prescriptions and they were just waiting on the delivery.  Daughter reports they cannot find the test strips at [facility] for her type of glucometer and it was too expensive to get a new machine and test strips.  RNCM provided patient with a donated glucometer from [facility].  It came with glucometer, test strips and lancets.  Family very gracious as they have been struggling with money.   Family reports also trying to get in touch with patient's primary care giver for the past 3 weeks but he has never returned their calls.  Patient is not very mobile, daughter can assist with getting her up to the bedside commode with the walker but the patient has been having dizzy spells.  They have a hospital bed and hoyer lift and wheelchair if needed.  RNCM discussed referral for home health and new primary care services with [facility].  The family agrees with referrals for both.  Alisha Winters with Alisha Winters accepted home health referral for RN, PT, OT, aide and SW. PCP referral given to [facility], Alisha Winters [phone].   Alisha Winters will reach out to daughter, Alisha Winters.  While RNCM was discussing discharge planning with family patient became unresponsive in the room.  Code Stroke was called and patient rushed to CT.  TOC will cont to follow.    Patient will need EMS transport home.    Expected Discharge Plan: [place] Barriers to Discharge: Continued Medical Work up   Patient Goals and CMS Choice Patient states their goals for this hospitalization and ongoing recovery are:: Family just asking for help at home and with resources to care for the patient CMS Medicare.gov Compare Post Acute Care list provided to:: Patient Represenative (must comment) Choice offered to / list presented to : Adult Children  Expected Discharge Plan and Services Expected Discharge Plan: [place]   Discharge Planning Services: CM Consult, Medication Assistance Post Acute Care Choice: [place] arrangements for the past 2 months: Mobile Home                 DME Arranged: N/A DME Agency: NA       HH Arranged: RN, PT, OT, Nurse's Aide, Social Work [employer] Agency: [place] Date [place]: 02/16/21 Time [place]: 1040 Representative spoke with at [place]: Alisha Winters Arrangements/Services Living arrangements for the past 2 months: Mobile Home Lives with:: Adult Children Patient language and need for interpreter reviewed:: Yes  Do you feel safe going back to the place where you live?: Yes      Need for Family Participation in Patient Care: Yes (Comment) Care giver support system in place?: Yes (comment) (daughters) Current home services: DME (hospital bed, bedside commode, walker, hoyer lift) Criminal Activity/Legal Involvement Pertinent to Current Situation/Hospitalization: No - Comment as needed  Activities of Daily Living      Permission Sought/Granted Permission sought to share information with : Case Manager, Family Supports,  Other (comment) Permission granted to share information with : Yes, Verbal Permission Granted  Share Information with NAME: Alisha Winters  Permission granted to share info w AGENCY: home health agencies and [facility]  Permission granted to share info w Relationship: daughter  Permission granted to share info w Contact Information: [phone]  Emotional Assessment Appearance:: Appears stated age Attitude/Demeanor/Rapport: Engaged Affect (typically observed): Accepting Orientation: : Oriented to Self, Oriented to Place, Oriented to Situation Alcohol / Substance Use: Not Applicable Psych Involvement: No (comment)  Admission diagnosis:  Hypoglycemia Patient Active Problem List   Diagnosis Date Noted   Closed intertrochanteric fracture of hip, right, initial encounter ([place]) 07/15/2019   PCP:  Alisha Late, MD Pharmacy:   [facility] (N), [place] [facility] [address]) [place] [address] Phone: [phone] Fax: [phone]     Social Determinants of Health (SDOH) Interventions    Readmission Risk Interventions No flowsheet data found.

## 2021-02-16 NOTE — Consult Note (Signed)
Neurology Stroke Consult H&P  Alisha Winters MR# [id number] 02/16/2021  CC: acutely unresponsive with left gaze  History is obtained from: ED staff and chart.  HPI: Alisha Winters is a 88 y.o. female PMHxas reviewed below, baseline dementia (extent not clear), A-fib found unresponsive with BGL 48 and brought in yesterday 02/15/2021. She was responsive and interactive to nursing staff in ED and suddenly became unresponsive.    Chart reveals hx of warfarin use but unknown if still currently on. Unable to obtain last dose information from patient and unable to reach family. Warfarin last filled was 10/03/2020.   LKW: 4825 tNK given: yes IR Thrombectomy yes Modified Rankin Scale: 1-No significant post stroke disability and can perform usual duties with stroke symptoms NIHSS: 24  ROS: A Unable to assess due to encephalopathy.  Past Medical History:  Diagnosis Date   Anemia    Cancer ([place])    Coronary artery disease    Depression    Diabetes mellitus without complication ([place])    Hypertension    Stroke [facility])     History reviewed. No pertinent family history.  Social History:  reports that she has never smoked. She has never used smokeless tobacco. She reports that she does not drink alcohol and does not use drugs.   Prior to Admission medications   Medication Sig Start Date End Date Taking? Authorizing Provider  amLODipine (NORVASC) 10 MG tablet Take 10 mg by mouth daily.   Yes [provider]  aspirin 81 MG tablet Take 81 mg by mouth daily.   Yes [provider]  furosemide (LASIX) 20 MG tablet Take 40 mg by mouth daily. 06/28/19  Yes [provider]  insulin NPH-regular Human (NOVOLIN 70/30) (70-30) 100 UNIT/ML injection Inject into the skin 2 (two) times daily with a meal.   Yes [provider]  isosorbide mononitrate (IMDUR) 30 MG 24 hr tablet Take 30 mg by mouth daily.   Yes [provider]  levofloxacin (LEVAQUIN) 500 MG  tablet Take 1 tablet (500 mg total) by mouth daily for 7 days. 02/15/21 02/22/21 Yes Paduchowski, Lennette Bihari, MD  metoprolol succinate (TOPROL-XL) 50 MG 24 hr tablet Take 50 mg by mouth daily. Take with or immediately following a meal.   Yes [provider]  rosuvastatin (CRESTOR) 10 MG tablet Take 10 mg by mouth daily.   Yes [provider]  warfarin (COUMADIN) 5 MG tablet Take 5 mg by mouth daily. 03/16/19  Yes [provider]  lisinopril (PRINIVIL,ZESTRIL) 10 MG tablet Take 10 mg by mouth daily.    [provider]    Exam: Current vital signs: BP (!) 191/113    Pulse 62    Temp 97.9 F (36.6 C) (Oral)    Resp 15    Ht 6' (1.829 m)    Wt 106.8 kg    SpO2 95%    BMI 31.93 kg/m   Physical Exam  Constitutional: Appears well-developed and well-nourished.  Psych: unable to assess due to acute stroke. Eyes: No scleral injection HENT: No OP obstruction. Head: Normocephalic.  Cardiovascular: Normal rate and regular rhythm.  Respiratory: Effort normal, symmetric excursions bilaterally, no audible wheezing. GI: Soft.  No distension. There is no tenderness.  Skin: WDI  Neuro: Mental Status: Patient is awake not oriented to person, place, year. Speech impaired fluency, comprehension and repetition. No clear signs of neglect. Visual Fields right field cut to confrontation.  Pupils are equal, round, and reactive to light. Left gaze deviation  can be overcome to midline. Facial sensation decreased on left noxi Facial right lower weakness.  Hearing is intact to voice. Uvula midline and palate elevates symmetrically. Tongue is midline without atrophy or fasciculations.  Tone is normal. Bulk is normal. Right extremities flaccid. Sensation is decreased to light touch and temperature in right extremities. Deep Tendon Reflexes: 2+ and symmetric in the biceps and patellae. Babinski (+) R FNF and HKS unable to perform. Gait - Deferred  I have reviewed labs in epic  and the pertinent results are: INR 1.3  I have reviewed the images obtained: NCT head showed no acute ischemic changes. CTA head and neck showed distal left M1/Proximal M2 occlusion.  Assessment: Alisha Winters is a 88 y.o. female PMHx CHF, HTN, DM 2, CAD, HLD with acute right hemiparesis with left gaze deviation. NCT head did not show acute ischemic changes and she was administered tNK. CTA head and neck showed distal left M1/Proximal M2 occlusion. Discussed with family and reviewed risks and benefits to thrombectomy and family consented. The want all measures taken.  Discussed the case neuroIR and the patient was a suitable candidate for intervention. She was hypertensive in the 200s and clevidipine was started. She was intubated to protect her airway and propofol started. He blood pressure declined SBP ~67, clevidipine stopped and a 1L bolus IVF given.   Impression:  Acute embolic stroke  left distal M1/proximal M2 Received tNK in ED NIHSS 24 Atrial fibrillation - anticoagulation unknown. CAD CHF HTN DM 2 HLD Possible dementia   Plan: Patient administered tPA/tNK, with no contraindications to administration.  Transfer to [facility] for left M1 thrombectomy.  Once at [facility] will need admission to ICU. CT head in 6 hours after procedure. Post tPA/tNK blood pressure: <180/139mm Hg for first 24 hours the gradual reduction over next few days. Maintain O2 sats > 94% Normothermia - For T>37.5C - acetaminophen 650mg  q4-6 hours PRN. No antiplatelet medications or anticoagulants for 24 hours following tPA/tNK and after imaging.  Gentle IV hydration.  Euglycemia and treat as needed.  PT/OT/Speech. MRI of brain when able. TTE.  Telemetry monitoring. Stroke education. Recommend PT/OT/SLP consult. If there is acute neurologic decline STAT CT head.  PPx:     GI - H2 blocker, docusate 100mg  bid.     DVT - SCDs for now. Precautions: Aspiration/seizure/fall.  This patient is critically ill  and at significant risk of neurological worsening, death and care requires constant monitoring of vital signs, hemodynamics,respiratory and cardiac monitoring, neurological assessment, discussion with family, other specialists and medical decision making of high complexity. I spent 75 minutes of neurocritical care time  in the care of  this patient. This was time spent independent of any time provided by nurse practitioner or PA.  Electronically signed by:  Lynnae Sandhoff, MD Page: [phone] 02/16/2021, 11:22 AM  If 7pm- 7am, please page neurology on call as listed in [place].

## 2021-02-16 NOTE — Progress Notes (Signed)
Pt intubated and under the care of anesthesia

## 2021-02-16 NOTE — Code Documentation (Signed)
Clevaprex paused at this time per MD Cheri Fowler

## 2021-02-16 NOTE — H&P (Addendum)
CC: acutely unresponsive with left gaze  History is obtained from: ED staff and chart.  HPI: Alisha Winters is a 88 y.o. female PMHxas reviewed below, baseline dementia (extent not clear), A-fib found unresponsive with BGL 48 and brought in yesterday 02/15/2021. She was responsive and interactive to nursing staff in ED and suddenly became unresponsive. TNK given at [facility] transferred here for thrombectomy. Met pt at ER bridge, intubated, unresponsive. BP dropped and cleviprex stopped. Taken directly to [employer] suite. Discussed case briefly with Dr. Mary Sella.   LKW: 0102 tNK given: yes IR Thrombectomy yes Modified Rankin Scale: 1-No significant post stroke disability and can perform usual duties with stroke symptoms NIHSS: 24  ROS: A Unable to assess due to encephalopathy.  Past Medical History:  Diagnosis Date   Anemia    Cancer ([place])    Coronary artery disease    Depression    Diabetes mellitus without complication ([place])    Hypertension    Stroke [facility])     History reviewed. No pertinent family history.  Social History:  reports that she has never smoked. She has never used smokeless tobacco. She reports that she does not drink alcohol and does not use drugs.   Prior to Admission medications   Medication Sig Start Date End Date Taking? Authorizing Provider  amLODipine (NORVASC) 10 MG tablet Take 10 mg by mouth daily.   Yes [provider]  aspirin 81 MG tablet Take 81 mg by mouth daily.   Yes [provider]  furosemide (LASIX) 20 MG tablet Take 40 mg by mouth daily. 06/28/19  Yes [provider]  insulin NPH-regular Human (NOVOLIN 70/30) (70-30) 100 UNIT/ML injection Inject into the skin 2 (two) times daily with a meal.   Yes [provider]  isosorbide mononitrate (IMDUR) 30 MG 24 hr tablet Take 30 mg by mouth daily.   Yes [provider]  levofloxacin (LEVAQUIN) 500 MG tablet Take 1 tablet (500 mg total) by mouth daily for 7 days.  02/15/21 02/22/21 Yes Paduchowski, Lennette Bihari, MD  metoprolol succinate (TOPROL-XL) 50 MG 24 hr tablet Take 50 mg by mouth daily. Take with or immediately following a meal.   Yes [provider]  rosuvastatin (CRESTOR) 10 MG tablet Take 10 mg by mouth daily.   Yes [provider]  warfarin (COUMADIN) 5 MG tablet Take 5 mg by mouth daily. 03/16/19  Yes [provider]  lisinopril (PRINIVIL,ZESTRIL) 10 MG tablet Take 10 mg by mouth daily.    [provider]    Exam: Current vital signs: There were no vitals taken for this visit.  Physical Exam  Constitutional: Appears well-developed and well-nourished.  Psych: unable to assess due to acute stroke. Eyes: No scleral injection HENT: No OP obstruction. Head: Normocephalic.  Cardiovascular: Normal rate and regular rhythm.  Respiratory: Effort normal, symmetric excursions bilaterally, no audible wheezing. GI: Soft.  No distension. There is no tenderness.  Skin: WDI  Neuro: Mental Status: Intubated and sedated.  Pupils are equal, round, and reactive to light. Left gaze deviation  Motor/sensory: no response to noxious stimuli. Gait - Deferred   INR 1.3  Images reviewed.  NCT head showed no acute ischemic changes. CTA head and neck showed distal left M1/Proximal M2 occlusion.  Assessment: Alisha Winters is a 88 y.o. female PMHx CHF, HTN, DM 2, CAD, HLD with acute right hemiparesis with left gaze deviation. NCT head did not show acute ischemic changes and she was administered tNK. CTA head and  neck showed distal left M1/Proximal M2 occlusion by Dr. Theda Sers at [facility].  Discussed the case neuroIR by Dr. Theda Sers and the patient was a suitable candidate for intervention.  hypertensive in the 200s and clevidipine was started blood pressure declined SBP ~67 after intubation and clevidipine stopped and a 1L bolus IVF given at [facility].   Impression:  Acute embolic stroke  left distal M1/proximal M2 Received tNK in  ED NIHSS 24 Atrial fibrillation - anticoagulation unknown.   Plan: Admit to ICU. CCM consulted to help with vent mgt and BP.Post TNK protocol. CT head in 6 hours after procedure. BP parameter per IR.  Maintain O2 sats > 94% Normothermia - For T>37.5C - acetaminophen 615m q4-6 hours PRN. No antiplatelet medications or anticoagulants for 24 hours following t Gentle IV hydration.  PT/OT/Speech. MRI of brain when able. TTE.  Telemetry monitoring. Stroke education. Recommend PT/OT/SLP consult. If there is acute neurologic decline STAT CT head.   CAD/CHF: per CCM  DM2: SSI  HLD: statin  GI: PPx:     GI - H2 blocker, docusate 1026mbid.     DVT - SCDs for now. Precautions: Aspiration/seizure/fall.  MDM: high s/p TNK, thrombectomy procedure. Admit to ICU.  ATTENDING ATTESTATION:  This patient is critically ill due to respiratory distress, stroke s/p tPA and at significant risk of neurological worsening, death form heart failure, respiratory failure, recurrent stroke, [facility], sepsis. This patient's care requires constant monitoring of vital signs, hemodynamics, respiratory and cardiac monitoring, review of multiple databases, neurological assessment, discussion with family, other specialists and medical decision making of high complexity. I spent 40 minutes of neurocritical care time in the care of this patient.   Alisha Demeo,MD  If 7pm- 7am, please page neurology on call as listed in [place]

## 2021-02-16 NOTE — ED Notes (Signed)
Pt left ED at this time in the care of [facility].

## 2021-02-16 NOTE — ED Notes (Signed)
Due to concerns regarding patient's safety with medications at home, patient will be held until Monday morning to be seen by Education officer, museum. This nurse and the charge nurse, Jinny Blossom, [place], spoke with Dr. Merri Ray regarding our concerns and he agreed.

## 2021-02-16 NOTE — Progress Notes (Signed)
Report given at bedside to Arvid Right RN. Pt stable upon transfer to unit with this RN and CRNA. Rt femoral site level 0 at transfer.

## 2021-02-16 NOTE — Progress Notes (Signed)
Patient transported from 4N25 to CT and back with no complications.

## 2021-02-16 NOTE — Code Documentation (Signed)
Patient preoxygenated via BVM at 100%. MD Howard preparing to intubate with Careers adviser.

## 2021-02-16 NOTE — Code Documentation (Signed)
Patient intubated at this time, confirmed with positive color change and bilateral breath sounds auscultated.  7.5 ET tube, 24 at the lip.

## 2021-02-16 NOTE — ED Notes (Signed)
Stroke Response Nurse Documentation Code Documentation  Alisha Winters is a 88 y.o. female  that was present in the ED and getting ready to be discharged when RN noted that pt had left gaze preference, rt sided weakness and aphasia. Code stroke was activated by ED RN.   She was LKW at 1030, pt was initially seen here last night for hypoglycemia and was about to be discharged back to SNF when she was noted to have S/S of stroke.   Stroke team at the bedside on patient arrival. Labs drawn and patient cleared for CT by Dr. Cheri Fowler. Patient to CT with team. NIHSS 23, see documentation for details and code stroke times. The following imaging was completed:  CT, CTA head and neck. Patient is a candidate for IV Thrombolytic per Dr Theda Sers and it was administered at 1100.  Care/Plan: Pt has M1 occlusion and will emergently be transferred to [facility] for IR.    Bedside handoff with ED RN Joellen Jersey.    Velta Addison Stroke Designer, fashion/clothing

## 2021-02-16 NOTE — Progress Notes (Signed)
CODE STROKE- PHARMACY COMMUNICATION   Time CODE STROKE called/page received: 1042  Time response to CODE STROKE was made (in person or via phone): 1042  Time Stroke Kit retrieved from [place] (only if needed): 1045  Name of Provider/Nurse contacted: Dr. Theda Sers  Informed MD pt w/ hx of warfarin use but unknown if still currently on. Unable to obtain last dose information from patient and unable to reach family. Warfarin last filled was 10/03/2020. Informed MD pt possibly still on warfarin and recommended collect PT-INR. Labs collected, however MD felt clinically appropriate to give TNK prior to results.   Past Medical History:  Diagnosis Date   Anemia    Cancer ([place])    Coronary artery disease    Depression    Diabetes mellitus without complication ([place])    Hypertension    Stroke [facility])    Prior to Admission medications   Medication Sig Start Date End Date Taking? Authorizing Provider  amLODipine (NORVASC) 10 MG tablet Take 10 mg by mouth daily.   Yes [provider]  levofloxacin (LEVAQUIN) 500 MG tablet Take 1 tablet (500 mg total) by mouth daily for 7 days. 02/15/21 02/22/21 Yes Harvest Dark, MD  acetaminophen (TYLENOL) 500 MG tablet Take 500 mg by mouth at bedtime.    [provider]  albuterol (VENTOLIN HFA) 108 (90 Base) MCG/ACT inhaler Inhale 2 puffs into the lungs every 6 (six) hours as needed for wheezing. 09/19/17   [provider]  aspirin 81 MG tablet Take 81 mg by mouth daily.    [provider]  ferrous sulfate 325 (65 FE) MG tablet Take 1 tablet (325 mg total) by mouth daily with breakfast. 07/19/19   Sharen Hones, MD  furosemide (LASIX) 20 MG tablet Take 40 mg by mouth daily. 06/28/19   [provider]  insulin NPH-regular Human (NOVOLIN 70/30) (70-30) 100 UNIT/ML injection Inject into the skin 2 (two) times daily with a meal.    [provider]  isosorbide mononitrate (IMDUR) 30 MG 24 hr tablet Take 30 mg by mouth  daily.    [provider]  lisinopril (PRINIVIL,ZESTRIL) 10 MG tablet Take 10 mg by mouth daily.    [provider]  metoprolol succinate (TOPROL-XL) 50 MG 24 hr tablet Take 50 mg by mouth daily. Take with or immediately following a meal.    [provider]  Omega-3 Fatty Acids (FISH OIL PO) Take 1 capsule by mouth daily.    [provider]  Probiotic Product (ALIGN) 4 MG CAPS Take 1 capsule by mouth daily.    [provider]  psyllium (METAMUCIL) 58.6 % packet Take 1 packet by mouth daily.    [provider]  senna (SENOKOT) 8.6 MG TABS tablet Take 1 tablet (8.6 mg total) by mouth 2 (two) times daily. 07/18/19   Sharen Hones, MD  vitamin B-12 (CYANOCOBALAMIN) 1000 MCG tablet Take 1,000 mcg by mouth daily.    [provider]  warfarin (COUMADIN) 5 MG tablet Take 5 mg by mouth daily. 03/16/19   [provider]  enoxaparin (LOVENOX) 40 MG/0.4ML injection Inject 0.4 mLs (40 mg total) into the skin daily for 3 days. 07/19/19 02/16/21  Sharen Hones, MD  HYDROcodone-acetaminophen (NORCO/VICODIN) 5-325 MG tablet Take 1 tablet by mouth every 4 (four) hours as needed for moderate pain. 07/18/19 02/16/21  Sharen Hones, MD    Sherilyn Banker ,PharmD Clinical Pharmacist  02/16/2021  10:43 AM

## 2021-02-16 NOTE — ED Notes (Signed)
This RN and Presenter, broadcasting signed consent to transfer. Family agreeable prior to intubation.

## 2021-02-16 NOTE — Progress Notes (Signed)
° ° ° °   02/16/21 1430  Pressure Injury 02/16/21 Coccyx Medial Stage 2 -  Partial thickness loss of dermis presenting as a shallow open injury with a red, pink wound bed without slough. red, open area with blanchable erythema present. Open area approximately 2cm x .5cm  Date First Assessed/Time First Assessed: 02/16/21 1430   Location: Coccyx  Location Orientation: Medial  Staging: Stage 2 -  Partial thickness loss of dermis presenting as a shallow open injury with a red, pink wound bed without slough.  Wound Descrip...  Dressing Type Foam - Lift dressing to assess site every shift  Dressing Changed  Site / Wound Assessment Pink;Red  Wound Length (cm) 2 cm  Wound Width (cm) 0.5 cm  Wound Surface Area (cm^2) 1 cm^2  Margins Unattached edges (unapproximated)  Drainage Amount None   Sacral wound present on arrival. WOC consult placed, Jerrye Bushy NP made aware.

## 2021-02-16 NOTE — ED Notes (Addendum)
Entered in error

## 2021-02-16 NOTE — ED Notes (Signed)
Activated code stroke to [place] at [place]

## 2021-02-16 NOTE — ED Notes (Signed)
Patient resting with her eyes closed on stretcher in room. RR even and unlabored. Patient in no obvious pain or distress at this time. Patient verbalizes no pain or complaints.

## 2021-02-16 NOTE — Anesthesia Procedure Notes (Signed)
Date/Time: 02/16/2021 12:50 PM Performed by: Dorann Lodge, CRNA Pre-anesthesia Checklist: Patient identified, Emergency Drugs available, Suction available, Patient being monitored and Timeout performed Patient Re-evaluated:Patient Re-evaluated prior to induction Oxygen Delivery Method: Circle system utilized Preoxygenation: Pre-oxygenation with 100% oxygen Induction Type: Inhalational induction with existing ETT Tube size: 7.5 mm Placement Confirmation: positive ETCO2, CO2 detector and breath sounds checked- equal and bilateral Secured at: 22 cm Tube secured with: Tape

## 2021-02-16 NOTE — Progress Notes (Signed)
233mL (2000mg ) fentanly gtt wasted into Stericycle bin with Jacqlyn Larsen.

## 2021-02-16 NOTE — ED Notes (Signed)
Patient heard screaming out from room. When this nurse arrived, she stated that she had all these cords wrapped around her. Patient had removed all of her monitoring leads, blood pressure cuff, and pulse ox and thrown them in the floor. When this nurse asked the patient if she knew where she was, she stated at home. This nurse reoriented the patient and she stated "oh yeah. You sure have been here a long time." This nurse placed her blood pressure cuff back on and her pulse ox but left off her monitor leads. Patient, again, asked if she could go "pee." I checked placement of her Purewick again and told her she could go whenever she wanted. Lights were dimmed again.

## 2021-02-16 NOTE — ED Notes (Signed)
Patient suddenly screamed out from room. This nurse ran to room thinking something had happened. When I arrived, patient was lying in bed in room and she stated that she fell asleep with the remote on her abdomen. The remote then fell in the floor when she moved. That was the reason for her scream. I continued to encourage the patient to use the call bell if she is having a problem. She apologized several times. Patient then stated that she needed to urinate. I, again, reminded the patient that she had a Purewick in place and checked its placement again since she stated she had been moving around. It remained in place and I explained that she could go anytime she was ready...to which she did. Patient's lights were then turned out again and door left just ajar at her request. Patient's call light is still attached to her right bedrail.

## 2021-02-16 NOTE — Progress Notes (Signed)
Pt arrived via [facility] intubated and sedated at this time. Unable to assess NIH. Received report from [place] and stroke response RN. Pt vitals stable. Will continue to monitor.Requested admission orders from neurology MD, he obliged.

## 2021-02-16 NOTE — Progress Notes (Signed)
Chaplain Maggie responded to code stroke to ED 9. Pt in CT. No family present.

## 2021-02-16 NOTE — Anesthesia Preprocedure Evaluation (Signed)
Anesthesia Evaluation  Patient identified by MRN, date of birth, ID band Patient unresponsive  Preop documentation limited or incomplete due to emergent nature of procedure.  Airway Mallampati: Intubated       Dental   Pulmonary    Pulmonary exam normal        Cardiovascular hypertension, Normal cardiovascular exam     Neuro/Psych    GI/Hepatic   Endo/Other  diabetes  Renal/GU      Musculoskeletal   Abdominal   Peds  Hematology   Anesthesia Other Findings   Reproductive/Obstetrics                             Anesthesia Physical Anesthesia Plan  ASA: 4 and emergent  Anesthesia Plan: General   Post-op Pain Management: Minimal or no pain anticipated   Induction:   PONV Risk Score and Plan: 3 and Dexamethasone, Ondansetron and Treatment may vary due to age or medical condition  Airway Management Planned: Oral ETT  Additional Equipment: Arterial line  Intra-op Plan:   Post-operative Plan: Post-operative intubation/ventilation  Informed Consent: I have reviewed the patients History and Physical, chart, labs and discussed the procedure including the risks, benefits and alternatives for the proposed anesthesia with the patient or authorized representative who has indicated his/her understanding and acceptance.     Dental advisory given  Plan Discussed with:   Anesthesia Plan Comments:         Anesthesia Quick Evaluation

## 2021-02-16 NOTE — ED Notes (Signed)
Called [facility] for emergent transport  since [facility] unavailable,  patient condition worsened and was put on drip and intubated so called [facility] about transport  said they could with RN going, [facility] called and transport on way cancelled [facility]   and [facility] on way

## 2021-02-16 NOTE — ED Notes (Signed)
Patient called this nurse back to the room because she wants me to change the channel for her until she finds the program that she was watching. I showed her, again, that the blue buttons control the channel up and down so that she can flip through to find what she was looking for. She still wanted me to do it for her and I had to tell her that I was unable to do that because I was working and had thing to do. Patient was unhappy with this explanation. She also asked that I lower her head which I did so. I told her if she needed anything else to let me know. Before leaving the room, she asked me again about going to the bathroom and I reminded her that she had a Purewick on and that she could go to the bathroom any time. I confirmed placement of the [place] and reminded her on how it worked. She was shocked and impressed.

## 2021-02-16 NOTE — ED Notes (Signed)
This nurse noted that the patient's monitoring equipment had come off again at the nursing station. I went to her bedside to find her pulling off her pulse ox and headed towards her blood pressure cuff. She then asked who the two people were that walked by her living room were. I reoriented the patient to the emergency room and she stated "oh yeah, I remember now."

## 2021-02-16 NOTE — ED Notes (Signed)
Patient resting comfortably on stretcher in room with eyes closed. RR even and unlabored. Patient does not verbalizes any needs or complaints at this time. Patient's monitoring equipment remain in place.

## 2021-02-16 NOTE — ED Notes (Addendum)
Patient's family out at nurse's station with cocerns about change in patient status.This Rn at bedside- patient with no purposeful movement present on the right side, patient with gaze to the left and head tilted to the left, no longer verbal. MD Bradler at bedside to evaluate patient. Code stroke called.

## 2021-02-16 NOTE — Procedures (Signed)
INTERVENTIONAL NEURORADIOLOGY BRIEF POSTPROCEDURE NOTE  DIAGNOSTIC CEREBRAL ANGIOGRAM AND MECHANICAL THROMBECTOMY  Attending: Dr. Erven Colla de Sindy Messing  Assistant: None.  Diagnosis: Left M1/MCA occlusion.   Access site: RCFA  Access closure: Perclose Prostyle  Anesthesia: GETA  Medication used: Refer to anesthesia notes.  Complications: None.  Estimated blood loss: 50-75 mL.  Specimen: None.  Findings: Occlusion of the left M1/MCA occlusion. Mechanical thrombectomy performed with combined stent retriever and aspiration with complete recanalization after 1 pass (TICI 3). No thromboembolic or hemorrhagic complication.  The patient tolerated the procedure well without incident or complication and is in stable condition.   PLAN: - Patient remains intubated for airway protection. - Bed rest x 6 hours post femoral puncture. - SBP 120-140 mmHg.

## 2021-02-16 NOTE — Anesthesia Procedure Notes (Signed)
Arterial Line Insertion Start/End2/13/2023 1:04 PM, 02/16/2021 1:11 PM Performed by: Suzette Battiest, MD, anesthesiologist  Patient location: Pre-op. Preanesthetic checklist: patient identified, IV checked, site marked, risks and benefits discussed, surgical consent, monitors and equipment checked, pre-op evaluation, timeout performed and anesthesia consent Lidocaine 1% used for infiltration Left, radial was placed Catheter size: 20 G Hand hygiene performed  and maximum sterile barriers used   Attempts: 3 Procedure performed without using ultrasound guided technique. Following insertion, dressing applied and Biopatch. Post procedure assessment: normal and unchanged  Patient tolerated the procedure well with no immediate complications.
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
## 2021-02-16 NOTE — Consult Note (Addendum)
NAME:  Alisha Winters, MRN:  [id number], DOB:  February 27, 1932, LOS: 0 ADMISSION DATE:  02/16/2021, CONSULTATION DATE:  2/13 REFERRING MD:  Reeves Forth, CHIEF COMPLAINT:  post-procedural critical care    History of Present Illness:  This is a 88 year old female w/ an extensive medical hx as per below. Initially presented to ER 2/12 for evaluation of decreased LOC in context of blood glucose of 48 (improved w/ Dextrose administration). Was being held for social work eval given concern about her ability to self administer her insulin and check blood glucose. Nursing notes capture several incidences of confusion requiring re-orientation. On 2/13 while in ER (at [facility]) family called out to nurse sudden mental status change consisting of: left sided gaze pref and right sided hemiplegia. Code stroke was called. NIHSS score 24  CTA completed: showed distal left M1/prox M2 occlusion, tNK IV administered at 11am.  Intubated for procedure. Had some post-porcedural transient hypotension. Transferred to [facility] for Neuro-IR evaluation.. Critical care called S/p mechanical thrombectomy 2/13  to assist w/ post-procedural ICU care.   Pertinent  Medical History  CAD Two-vessel disease with normal LVEF by cardiac cath 7/12.  Colon cancer s/p resection and chemo 1997.  IDDM Chronic anemia  Chronic afib (coumadin and metoprolol)  Hyperlipidemia  Depression  HTN (on Norvasc, lasix, Imdur, Lisinopril, metoprolol)  Stroke 2003 Bilateral cataract surg  Note extensive allergy list Significant Hospital Events: Including procedures, antibiotic start and stop dates in addition to other pertinent events   2/12 initially to ER w/ AMS 2/2 hypoglycemia. Was awaiting social work consult. Had multiple witnessed episodes of confusion requiring re-orientation  2/13 Code stroke called for sudden left gaze preference and right sided hemiparesis. NIHSS score 24. CT angio: 1. Positive for emergent large vessel occlusion at the distal left M1  segment just beyond the anterior temporal branch. 2. Calcified plaque causes 40% narrowing at the left ICA origin. 3. Advanced narrowing of the non dominant left V4 segment. Small basilar in the setting of fetal type bilateral PCA flow. tNK administered at 11am. Intubated for planned neuro-intervention. Had some brief post intubation hypotension. Transported to [facility] for IR. Admitted to Neuro ICU s/p mechanical thrombectomy of M1/MCA. Returned on vent   Interim History / Subjective:  Remains sedated post procedure  Objective   Blood pressure 123/73, pulse (Abnormal) 47, resp. rate 18, height 6' (1.829 m), SpO2 100 %.    Vent Mode: PRVC FiO2 (%):  [60 %-100 %] 100 % Set Rate:  [18 bmp-20 bmp] 18 bmp Vt Set:  [400 mL-580 mL] 580 mL PEEP:  [5 cmH20] 5 cmH20 Plateau Pressure:  [20 cmH20] 20 cmH20   Intake/Output Summary (Last 24 hours) at 02/16/2021 1524 Last data filed at 02/16/2021 1500 Gross per 24 hour  Intake 602.88 ml  Output 171 ml  Net 431.88 ml   There were no vitals filed for this visit.  Examination: General: obese 88 year old female sedated on vent. RN just turned off propofol.  HENT: orally intubated. PERRL orally intubated. OGT in place  Lungs: coarse scattered rhonchi. Currently on full vent support. Pcxr personally reviewed: the ett is essentially at level of [place]ina. The OGT appears to travel down the LMSB but does go down past the diaphragm. The lung volumes are low and there is bibasilar atx w/ some bilateral airspace disease  Cardiovascular: HR 40s -50s irreg  Abdomen: soft + air auscultation noted for OGT placement  Extremities: + LE edema pulses strong  Neuro: sedated  currently  GU: foley in place   Resolved Hospital Problem list     Assessment & Plan:  Principal Problem:   Stroke (cerebrum) ([place]) Active Problems:   Chronic a-fib ([place])   Hypertension   Coronary artery disease   Hyperlipidemia   Diabetes mellitus without complication (HCC)   Depression    Anemia   Delirium   Atelectasis   Obesity (BMI 30.0-34.9)   Pressure ulcer of coccygeal region, stage 2 ([place])  Acute Left MCA stroke s/p TPA (11 am 2/13) and successful mechanical thrombectomy 3/00, complicated by delirium and h/o depression  -risk factors: prior stroke, HTN, afib, HL -witnessed episodes of confusion documented in ER, Her INR was subtherapeutic. Also raising question is there component of underlying memory deficit or dementia. This might explain why she had recurrent stroke when she should be on coumadin  Plan Serial neuro checks  SBP goal < 140 for at least 24 hrs, but also > 100  Avoiding vena puncture or invasive procedures for 24 hrs Secondary stroke prevention per Neuro  F/u CT this evening and F/u MRI in am  Sedation protocol RASS goal 0 to -1  HTN  Plan Cont Vleviprex goal SBP < 140  Add scheduled hydralazine once FT placement verified.   H/o two vessel CAD Chronic AF c/b bradycardia.  Witnessed run of VT  HR as low as 38 but non-sustained.  Plan Hold beta-blocker  Ck Mg and K  Tele  Anticoagulation on hold s/p TPA  Post procedural ventilator management.   Atelectasis  Intubated for procedure. Ability to protect airway not clear given pre-intubation documentation  Certainly risk for aspiration  Plan Repeat cxr to assess for ETT placement  Full vent support  Assess for SBT after MRI PAD protocol RASS as above VAP bundle  Sputum culture  PCT algo   Fluid and electrolyte imbalance Plan Monitor  Correct as indicated   Possible malpositioned OGT Plan Obtain lateral cxr to better assess where OBT terminates  H/o hyperlipidemia  Plan Add back crestor VT   DM w/ hyperglycemia Plan SSI  Goal 140-180  Obesity  Plan RD consult   Sacral pressure ulcer Plan WOC consult   Best Practice (right click and "Reselect all SmartList Selections" daily)   Diet/type: NPO DVT prophylaxis: SCD GI prophylaxis: PPI Lines: N/A Foley:  N/A Code  Status:  full code Last date of multidisciplinary goals of care discussion [pending ]  Labs   CBC: Recent Labs  Lab 02/15/21 2045 02/16/21 1100  WBC 7.6 6.9  NEUTROABS  --  4.6  HGB 15.1* 12.9  HCT 47.8* 41.1  MCV 86.0 86.2  PLT 269 923    Basic Metabolic Panel: Recent Labs  Lab 02/15/21 2045 02/16/21 1100  NA 136 131*  K 3.5 3.6  CL 99 100  CO2 28 25  GLUCOSE 144* 197*  BUN 12 11  CREATININE 0.61 0.61  CALCIUM 8.1* 7.5*   GFR: Estimated Creatinine Clearance: 67.7 mL/min (by C-G formula based on SCr of 0.61 mg/dL). Recent Labs  Lab 02/15/21 2045 02/16/21 1100  WBC 7.6 6.9  LATICACIDVEN 1.5  --     Liver Function Tests: Recent Labs  Lab 02/15/21 2045 02/16/21 1100  AST 23 16  ALT 14 11  ALKPHOS 85 69  BILITOT 0.7 0.5  PROT 6.7 5.5*  ALBUMIN 2.7* 2.2*   No results for input(s): LIPASE, AMYLASE in the last 168 hours. No results for input(s): AMMONIA in the last 168 hours.  ABG  No results found for: PHART, PCO2ART, PO2ART, HCO3, TCO2, ACIDBASEDEF, O2SAT   Coagulation Profile: Recent Labs  Lab 02/16/21 1100  INR 1.3*    Cardiac Enzymes: No results for input(s): CKTOTAL, CKMB, CKMBINDEX, TROPONINI in the last 168 hours.  HbA1C: Hgb A1c MFr Bld  Date/Time Value Ref Range Status  07/15/2019 10:56 AM 8.8 (H) 4.8 - 5.6 % Final    Comment:    (NOTE) Pre diabetes:          5.7%-6.4%  Diabetes:              >6.4%  Glycemic control for   <7.0% adults with diabetes     CBG: Recent Labs  Lab 02/15/21 2019 02/15/21 2322 02/16/21 0535 02/16/21 0725 02/16/21 1031  GLUCAP 181* 89 154* 127* 164*    Review of Systems:   Not able  Past Medical History:  She,  has a past medical history of Anemia, Cancer ([place]), Coronary artery disease, Depression, Diabetes mellitus without complication ([place]), Hypertension, Hypotension due to drugs (02/16/2021), and Stroke ([place]).   Surgical History:   Past Surgical History:  Procedure Laterality Date    CHOLECYSTECTOMY     COLON SURGERY     EYE SURGERY     HERNIA REPAIR     HIP PINNING,CANNULATED Right 07/16/2019   Procedure: CANNULATED HIP PINNING;  Surgeon: Earnestine Leys, MD;  Location: [facility] ORS;  Service: Orthopedics;  Laterality: Right;   IR CT HEAD LTD  02/16/2021   IR PERCUTANEOUS ART THROMBECTOMY/INFUSION INTRACRANIAL INC DIAG ANGIO  02/16/2021   IR US GUIDE VASC ACCESS RIGHT  02/16/2021     Social History:   reports that she has never smoked. She has never used smokeless tobacco. She reports that she does not drink alcohol and does not use drugs.   Family History:  Her family history is not on file.   Allergies Allergies  Allergen Reactions   Penicillins Rash   Atorvastatin Diarrhea   Citalopram Nausea Only   Codeine Itching   Meloxicam Nausea Only   Simvastatin Other (See Comments)    Muscle pain   Hydrochlorothiazide Other (See Comments)   Cephalexin Nausea Only     Home Medications  Prior to Admission medications   Medication Sig Start Date End Date Taking? Authorizing Provider  amLODipine (NORVASC) 10 MG tablet Take 10 mg by mouth daily.    [provider]  aspirin 81 MG tablet Take 81 mg by mouth daily.    [provider]  furosemide (LASIX) 20 MG tablet Take 40 mg by mouth daily. 06/28/19   [provider]  insulin NPH-regular Human (NOVOLIN 70/30) (70-30) 100 UNIT/ML injection Inject into the skin 2 (two) times daily with a meal.    [provider]  isosorbide mononitrate (IMDUR) 30 MG 24 hr tablet Take 30 mg by mouth daily.    [provider]  levofloxacin (LEVAQUIN) 500 MG tablet Take 1 tablet (500 mg total) by mouth daily for 7 days. 02/15/21 02/22/21  Harvest Dark, MD  lisinopril (PRINIVIL,ZESTRIL) 10 MG tablet Take 10 mg by mouth daily.    [provider]  metoprolol succinate (TOPROL-XL) 50 MG 24 hr tablet Take 50 mg by mouth daily. Take with or immediately following a meal.    [provider]  rosuvastatin (CRESTOR) 10 MG tablet Take 10 mg by mouth daily.    [provider]  warfarin (COUMADIN) 5 MG tablet Take 5 mg by mouth daily. 03/16/19   [provider]     Critical care time: 45 minutes    Erick Colace ACNP-BC [place] Pager # [phone] OR # [phone] if no answer

## 2021-02-16 NOTE — ED Notes (Signed)
Patient resting comfortably on stretcher in room. RR even and unlabored. She is watching TV and continues to snack on the food that I provided. Patient has called this nurse to the bedside several times to ask questions that she had already asked and had answered. It is obvious that she has issues with her memory. Patient currently has no complaints or any needs.

## 2021-02-17 ENCOUNTER — Inpatient Hospital Stay (HOSPITAL_COMMUNITY): Payer: Medicare PPO

## 2021-02-17 ENCOUNTER — Encounter (HOSPITAL_COMMUNITY): Payer: Self-pay | Admitting: Radiology

## 2021-02-17 DIAGNOSIS — J9601 Acute respiratory failure with hypoxia: Secondary | ICD-10-CM

## 2021-02-17 DIAGNOSIS — I6389 Other cerebral infarction: Secondary | ICD-10-CM

## 2021-02-17 DIAGNOSIS — Z7189 Other specified counseling: Secondary | ICD-10-CM | POA: Diagnosis not present

## 2021-02-17 DIAGNOSIS — Z66 Do not resuscitate: Secondary | ICD-10-CM | POA: Diagnosis not present

## 2021-02-17 DIAGNOSIS — I63512 Cerebral infarction due to unspecified occlusion or stenosis of left middle cerebral artery: Secondary | ICD-10-CM

## 2021-02-17 LAB — LIPID PANEL
Cholesterol: 84 mg/dL (ref 0–200)
HDL: 24 mg/dL — ABNORMAL LOW (ref >40)
LDL Cholesterol: 41 mg/dL (ref 0–99)
Total CHOL/HDL Ratio: 3.5 RATIO
Triglycerides: 97 mg/dL (ref <150)
VLDL: 19 mg/dL (ref 0–40)

## 2021-02-17 LAB — GLUCOSE, CAPILLARY
Glucose-Capillary: 113 mg/dL — ABNORMAL HIGH (ref 70–99)
Glucose-Capillary: 123 mg/dL — ABNORMAL HIGH (ref 70–99)
Glucose-Capillary: 129 mg/dL — ABNORMAL HIGH (ref 70–99)
Glucose-Capillary: 130 mg/dL — ABNORMAL HIGH (ref 70–99)
Glucose-Capillary: 140 mg/dL — ABNORMAL HIGH (ref 70–99)
Glucose-Capillary: 162 mg/dL — ABNORMAL HIGH (ref 70–99)

## 2021-02-17 LAB — ECHOCARDIOGRAM COMPLETE
AR max vel: 2.61 cm2
AV Area VTI: 2.44 cm2
AV Area mean vel: 2.48 cm2
AV Mean grad: 3 mmHg
AV Peak grad: 6.3 mmHg
Ao pk vel: 1.25 m/s
Calc EF: 56.7 %
Height: 72 in
S' Lateral: 2.5 cm
Single Plane A2C EF: 47.1 %
Single Plane A4C EF: 64.4 %

## 2021-02-17 LAB — BASIC METABOLIC PANEL
Anion gap: 8 (ref 5–15)
BUN: 17 mg/dL (ref 8–23)
CO2: 20 mmol/L — ABNORMAL LOW (ref 22–32)
Calcium: 7.6 mg/dL — ABNORMAL LOW (ref 8.9–10.3)
Chloride: 108 mmol/L (ref 98–111)
Creatinine, Ser: 1.07 mg/dL — ABNORMAL HIGH (ref 0.44–1.00)
GFR, Estimated: 50 mL/min — ABNORMAL LOW (ref >60)
Glucose, Bld: 156 mg/dL — ABNORMAL HIGH (ref 70–99)
Potassium: 4 mmol/L (ref 3.5–5.1)
Sodium: 136 mmol/L (ref 135–145)

## 2021-02-17 LAB — PROCALCITONIN: Procalcitonin: <0.1 ng/mL

## 2021-02-17 MED ORDER — ROSUVASTATIN CALCIUM 5 MG PO TABS
10.0000 mg | ORAL_TABLET | Freq: Every day | ORAL | Status: DC
  Administered 2021-02-17 – 2021-02-20 (×4): 10 mg
  Filled 2021-02-17 (×4): qty 2

## 2021-02-17 MED ORDER — ISOSORBIDE DINITRATE 30 MG PO TABS
30.0000 mg | ORAL_TABLET | Freq: Two times a day (BID) | ORAL | Status: DC
  Filled 2021-02-17: qty 1

## 2021-02-17 MED ORDER — ISOSORBIDE DINITRATE 5 MG PO TABS
15.0000 mg | ORAL_TABLET | Freq: Two times a day (BID) | ORAL | Status: DC
  Administered 2021-02-17 – 2021-02-20 (×7): 15 mg
  Filled 2021-02-17 (×9): qty 1

## 2021-02-17 MED ORDER — ASPIRIN 81 MG PO CHEW
81.0000 mg | CHEWABLE_TABLET | Freq: Every day | ORAL | Status: DC
  Administered 2021-02-17 – 2021-02-20 (×4): 81 mg
  Filled 2021-02-17 (×4): qty 1

## 2021-02-17 MED ORDER — HYDRALAZINE HCL 20 MG/ML IJ SOLN
20.0000 mg | INTRAMUSCULAR | Status: DC | PRN
  Administered 2021-02-18 – 2021-02-19 (×2): 20 mg via INTRAVENOUS
  Filled 2021-02-17 (×2): qty 1

## 2021-02-17 MED ORDER — ENOXAPARIN SODIUM 40 MG/0.4ML IJ SOSY
40.0000 mg | PREFILLED_SYRINGE | INTRAMUSCULAR | Status: DC
  Administered 2021-02-17 – 2021-02-19 (×3): 40 mg via SUBCUTANEOUS
  Filled 2021-02-17 (×3): qty 0.4

## 2021-02-17 NOTE — Progress Notes (Signed)
Discussed foley catheter with Marni Griffon, NP, who agreed we need to keep it since she has had low urine output and needs close monitoring.

## 2021-02-17 NOTE — Progress Notes (Signed)
° °  Echocardiogram 2D Echocardiogram has been performed.  Alisha Winters 02/17/2021, 1:43 PM

## 2021-02-17 NOTE — Progress Notes (Signed)
°  Transition of Care [facility]) Screening Note   Patient Details  Name: Alisha Winters Date of Birth: 05/14/1932   [facility] of [facility]) CM/SW Contact:    Benard Halsted, LCSW Phone Number: 02/17/2021, 3:05 PM    Transition of Care Department [facility]) has reviewed patient and no TOC needs have been identified at this time. We will continue to monitor patient advancement through interdisciplinary progression rounds. If new patient transition needs arise, please place a TOC consult.

## 2021-02-17 NOTE — Progress Notes (Signed)
F/u SBT  VTs excellent and no sig WOB but pt fairly difficult to arouse. I spoke w/ family. As we also know that we will not re-intubate would be best to extubate first thing in am that way if she were to decline family could be more readily available.   Erick Colace ACNP-BC [place] Pager # [phone] OR # [phone] if no answer

## 2021-02-17 NOTE — Anesthesia Postprocedure Evaluation (Signed)
Anesthesia Post Note  Patient: Alisha Winters  Procedure(s) Performed: IR WITH ANESTHESIA     Patient location during evaluation: SICU Anesthesia Type: General Level of consciousness: sedated Pain management: pain level controlled Vital Signs Assessment: post-procedure vital signs reviewed and stable Respiratory status: patient remains intubated per anesthesia plan Cardiovascular status: stable Postop Assessment: no apparent nausea or vomiting Anesthetic complications: no   No notable events documented.  Last Vitals:  Vitals:   02/17/21 1526 02/17/21 1600  BP:  (!) 132/57  Pulse:  (!) 50  Resp:  17  Temp:  36.8 C  SpO2: 99% 99%    Last Pain:  Vitals:   02/17/21 1600  TempSrc: Axillary  PainSc:                  Alisha Winters

## 2021-02-17 NOTE — Progress Notes (Addendum)
STROKE TEAM PROGRESS NOTE   INTERVAL HISTORY Patient is seen in her room with her sister at the bedside.  Yesterday, she was admitted from home when she was found to have right arm and leg weakness and left gaze deviation.  Patient was given TNK and transferred here for thrombectomy.  Thrombectomy was successful with TICI3 recanalization on the left M1 MCA.  Patient remained intubated after the procedure.  Upon discussion with patient's sister, code status was changed to DNR and decision was made not to reintubate after extubation (planned for tomorrow AM).  Vital signs are stable.  Blood pressure currently controlled.  Patient is awake but globally aphasic and not following commands with dense right hemiplegia  Vitals:   02/17/21 1230 02/17/21 1300 02/17/21 1330 02/17/21 1400  BP: (!) 146/92 (!) 166/67 112/67 (!) 124/57  Pulse: 62 62 (!) 59 (!) 55  Resp: (!) 24 (!) 23 16 17   Temp:      TempSrc:      SpO2: 99% 98% 97% 98%  Height:       CBC:  Recent Labs  Lab 02/15/21 2045 02/16/21 1100 02/16/21 1523  WBC 7.6 6.9  --   NEUTROABS  --  4.6  --   HGB 15.1* 12.9 12.9  HCT 47.8* 41.1 38.0  MCV 86.0 86.2  --   PLT 269 270  --    Basic Metabolic Panel:  Recent Labs  Lab 02/16/21 1527 02/17/21 0954  NA 137 136  K 3.7 4.0  CL 105 108  CO2 24 20*  GLUCOSE 221* 156*  BUN 10 17  CREATININE 0.68 1.07*  CALCIUM 7.8* 7.6*  MG 1.9  --    Lipid Panel:  Recent Labs  Lab 02/17/21 0529  CHOL 84  TRIG 97  HDL 24*  CHOLHDL 3.5  VLDL 19  LDLCALC 41   HgbA1c:  Recent Labs  Lab 02/16/21 1520  HGBA1C 6.2*   Urine Drug Screen:  Recent Labs  Lab 02/16/21 1151  LABOPIA NONE DETECTED  COCAINSCRNUR NONE DETECTED  LABBENZ NONE DETECTED  AMPHETMU NONE DETECTED  THCU NONE DETECTED  LABBARB NONE DETECTED    Alcohol Level No results for input(s): ETH in the last 168 hours.  IMAGING past 24 hours CT HEAD WO CONTRAST (5MM)  Result Date: 02/16/2021 CLINICAL DATA:  Stroke  follow-up EXAM: CT HEAD WITHOUT CONTRAST TECHNIQUE: Contiguous axial images were obtained from the base of the skull through the vertex without intravenous contrast. RADIATION DOSE REDUCTION: This exam was performed according to the departmental dose-optimization program which includes automated exposure control, adjustment of the mA and/or kV according to patient size and/or use of iterative reconstruction technique. COMPARISON:  None. FINDINGS: Brain: There is no mass, hemorrhage or extra-axial collection. There is generalized atrophy without lobar predilection. Hypodensity of the white matter is most commonly associated with chronic microvascular disease. Unchanged appearance of old left cerebellar infarct Vascular: Atherosclerotic calcification of the internal carotid arteries at the skull base. No abnormal hyperdensity of the major intracranial arteries or dural venous sinuses. Skull: The visualized skull base, calvarium and extracranial soft tissues are normal. Sinuses/Orbits: No fluid levels or advanced mucosal thickening of the visualized paranasal sinuses. No mastoid or middle ear effusion. The orbits are normal. IMPRESSION: 1. No acute intracranial abnormality. 2. 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 Electronically Signed   By: Ulyses Jarred M.D.   On: 02/16/2021 23:13   MR BRAIN WO CONTRAST  Result Date: 02/17/2021 CLINICAL DATA:  Neuro deficit, acute, stroke suspected EXAM: MRI HEAD WITHOUT CONTRAST TECHNIQUE: Multiplanar, multiecho pulse sequences of the brain and surrounding structures were obtained without intravenous contrast. COMPARISON:  CT head February 16, 2021. FINDINGS: Brain: Acute or subacute left MCA territory infarct involving left frontal and parietal cortex and insula. Multiple additional small infarcts in the left occipital lobe and left cerebellum. No significant mass effect. No hydrocephalus, mass lesion, midline shift, or extra-axial fluid  collection. Cerebral atrophy. Vascular: Major arterial flow voids are maintained at the skull base. Skull and upper cervical spine: Normal marrow signal. Sinuses/Orbits: Mild-to-moderate paranasal sinus mucosal thickening with small air-fluid level in left sphenoid sinus. Unremarkable orbits. Other: Small bilateral mastoid effusions. IMPRESSION: Acute or subacute left MCA territory infarct involving left frontoparietal cortex and insula. Multiple additional small infarcts in the left occipital lobe and left cerebellum. Given involvement of multiple vascular territories, consider an embolic etiology. No mass effect. Electronically Signed   By: Margaretha Sheffield M.D.   On: 02/17/2021 11:31   Portable Chest xray  Result Date: 02/17/2021 CLINICAL DATA:  Endotracheal tube. EXAM: PORTABLE CHEST 1 VIEW COMPARISON:  February 16, 2021. FINDINGS: Stable cardiomediastinal silhouette. Endotracheal and nasogastric tubes are unchanged in position. Right lung is clear. Minimal left basilar subsegmental atelectasis is noted. Bony thorax is unremarkable. IMPRESSION: Stable support apparatus. Minimal left basilar subsegmental atelectasis. Electronically Signed   By: Marijo Conception M.D.   On: 02/17/2021 08:30   ECHOCARDIOGRAM COMPLETE  Result Date: 02/17/2021    ECHOCARDIOGRAM REPORT   Patient Name:   Alisha Winters Date of Exam: 02/17/2021 Medical Rec #:  [id number]        Height:       72.0 in Accession #:    [id number]       Weight:       235.4 lb Date of Birth:  Dec 04, 1932        BSA:          2.284 m Patient Age:    56 years         BP:           123/57 mmHg Patient Gender: F                HR:           62 bpm. Exam Location:  Inpatient Procedure: 2D Echo, Cardiac Doppler and Color Doppler Indications:    I63.9 STROKE  History:        Patient has prior history of Echocardiogram examinations, most                 recent 07/15/2019. Stroke; Risk Factors:Hypertension, Diabetes                 and Family History of Coronary  Artery Disease. CA / CAD.  Sonographer:    Beryle Beams Referring Phys: [id number] [place]  Sonographer Comments: No subcostal window, patient is morbidly obese and echo performed with patient supine and on artificial respirator. IMPRESSIONS  1. Left ventricular ejection fraction, by estimation, is 60 to 65%. The left ventricle has normal function. Left ventricular endocardial border not optimally defined to evaluate regional wall motion. Left ventricular diastolic function could not be evaluated.  2. Right ventricular systolic function is normal. The right ventricular size is normal. Tricuspid regurgitation signal is inadequate for assessing PA pressure.  3. The mitral valve is grossly normal. No evidence of mitral valve regurgitation. No evidence of mitral stenosis.  4. The aortic  valve is grossly normal. Aortic valve regurgitation is not visualized. No aortic stenosis is present. Conclusion(s)/Recommendation(s): No intracardiac source of embolism detected on this transthoracic study. Consider a transesophageal echocardiogram to exclude cardiac source of embolism if clinically indicated. FINDINGS  Left Ventricle: Left ventricular ejection fraction, by estimation, is 60 to 65%. The left ventricle has normal function. Left ventricular endocardial border not optimally defined to evaluate regional wall motion. The left ventricular internal cavity size was normal in size. There is no left ventricular hypertrophy. Left ventricular diastolic function could not be evaluated due to nondiagnostic images. Left ventricular diastolic function could not be evaluated. Right Ventricle: No subviews. The right ventricular size is normal. No increase in right ventricular wall thickness. Right ventricular systolic function is normal. Tricuspid regurgitation signal is inadequate for assessing PA pressure. Left Atrium: Left atrial size was normal in size. Right Atrium: Right atrial size was normal in size. Pericardium: Trivial  pericardial effusion is present. Mitral Valve: The mitral valve is grossly normal. No evidence of mitral valve regurgitation. No evidence of mitral valve stenosis. Tricuspid Valve: The tricuspid valve is grossly normal. Tricuspid valve regurgitation is not demonstrated. No evidence of tricuspid stenosis. Aortic Valve: The aortic valve is grossly normal. Aortic valve regurgitation is not visualized. No aortic stenosis is present. Aortic valve mean gradient measures 3.0 mmHg. Aortic valve peak gradient measures 6.2 mmHg. Aortic valve area, by VTI measures 2.44 cm. Pulmonic Valve: The pulmonic valve was grossly normal. Pulmonic valve regurgitation is not visualized. No evidence of pulmonic stenosis. Aorta: The aortic root and ascending aorta are structurally normal, with no evidence of dilitation. Venous: The inferior vena cava was not well visualized. IAS/Shunts: The atrial septum is grossly normal.  LEFT VENTRICLE PLAX 2D LVIDd:         4.00 cm     Diastology LVIDs:         2.50 cm     LV e' medial:  4.97 cm/s LV PW:         1.10 cm     LV e' lateral: 7.96 cm/s LV IVS:        0.80 cm LVOT diam:     2.30 cm LV SV:         50 LV SV Index:   22 LVOT Area:     4.15 cm  LV Volumes (MOD) LV vol d, MOD A2C: 57.5 ml LV vol d, MOD A4C: 74.1 ml LV vol s, MOD A2C: 30.4 ml LV vol s, MOD A4C: 26.4 ml LV SV MOD A2C:     27.1 ml LV SV MOD A4C:     74.1 ml LV SV MOD BP:      37.2 ml RIGHT VENTRICLE RV S prime:     18.40 cm/s TAPSE (M-mode): 1.2 cm LEFT ATRIUM             Index        RIGHT ATRIUM           Index LA diam:        3.70 cm 1.62 cm/m   RA Area:     12.90 cm LA Vol (A2C):   56.7 ml 24.83 ml/m  RA Volume:   29.60 ml  12.96 ml/m LA Vol (A4C):   46.4 ml 20.32 ml/m LA Biplane Vol: 53.6 ml 23.47 ml/m  AORTIC VALVE                    PULMONIC VALVE AV Area (Vmax):  2.61 cm     PV Vmax:       0.83 m/s AV Area (Vmean):   2.48 cm     PV Vmean:      48.600 cm/s AV Area (VTI):     2.44 cm     PV VTI:        0.116 m AV  Vmax:           125.00 cm/s  PV Peak grad:  2.8 mmHg AV Vmean:          85.000 cm/s  PV Mean grad:  1.0 mmHg AV VTI:            0.204 m AV Peak Grad:      6.2 mmHg AV Mean Grad:      3.0 mmHg LVOT Vmax:         78.60 cm/s LVOT Vmean:        50.800 cm/s LVOT VTI:          0.120 m LVOT/AV VTI ratio: 0.59  AORTA Ao Root diam: 2.90 cm Ao Asc diam:  2.70 cm  SHUNTS Systemic VTI:  0.12 m Systemic Diam: 2.30 cm Eleonore Chiquito MD Electronically signed by Eleonore Chiquito MD Signature Date/Time: 02/17/2021/2:25:21 PM    Final     PHYSICAL EXAM General:  Intubated, obese patient in no acute distress  Neurological:  Patient will open eyes and look at examiner but does not follow commands.  Globally aphasic.  Left gaze deviation, patient blinks to threat on left side only.  5/5 strength in LUE, 0/5 strength in RUE, 3/5 strength in BLE.  ASSESSMENT/PLAN [name] is a 88 y.o. female with history of dementia, atrial fibrillation, prior stroke, colon cancer and T2DM presenting with right arm and leg weakness and left gaze deviation.  Patient was given TNK and transferred here for thrombectomy.  Thrombectomy was successful with TICI3 recanalization on the left M1 MCA.  Patient remained intubated after the procedure.  Upon discussion with patient's sister, code status was changed to DNR and decision was made not to reintubate after extubation (planned for tomorrow AM).  Stroke:  left MCA infarct  likely secondary due to left middle cerebral artery occlusion etiology likely A-fib not on anticoagulation  code Stroke CT head No acute abnormality. Chronic left cerebellar infarct CTA head & neck LVO in distal left M1 segment, 40% narrowing of left ICA Post IR CT No acute abnormality, old cerebellar infarct MRI  left MCA territory infarct involving left frontoparietal cortex and insula, additional small infarcts in left occipital lobe and left cerebellum 2D Echo EF 60-65%, normal atrial septum, no intracardiac source  of embolism LDL 41 HgbA1c 6.2 VTE prophylaxis - lovenox    Diet   Diet NPO time specified   aspirin 81 mg daily prior to admission, now on aspirin 81 mg daily.  Therapy recommendations:  pending Disposition:  pending  Hypertension Home meds:  lisinopril 10 mg daily Stable Keep SBP 120-140 Cleviprex as needed Long-term BP goal normotensive  Hyperlipidemia Home meds:  rosuvastatin 10 mg daily, resumed in hospital LDL 41, goal < 70 Continue statin at discharge  Diabetes type II Controlled Home meds:  insulin 70/30 unknown dose with meals HgbA1c 6.2, goal < 7.0 CBGs Recent Labs    02/17/21 0343 02/17/21 0808 02/17/21 1146  GLUCAP 162* 140* 129*    SSI  Other Stroke Risk Factors Advanced Age >/= 65  Obesity, Body mass index is 31.93 kg/m., BMI >/= 30  associated with increased stroke risk, recommend weight loss, diet and exercise as appropriate  Hx stroke  Other Active Problems Acute respiratory failure Patient was intubated for thrombectomy Plan for extubation tomorrow Management per [facility] day # [place] , MSN, AGACNP-BC [facility] See Amion for schedule and pager information 02/17/2021 3:40 PM   STROKE MD NOTE : .I have personally obtained history,examined this patient, reviewed notes, independently viewed imaging studies, participated in medical decision making and plan of care.ROS completed by me personally and pertinent positives fully documented  I have made any additions or clarifications directly to the above note. Agree with note above.  Patient presented with aphasia and right hemiplegia due to left M1 occlusion and was treated with thrombolysis with IV TNK followed by successful mechanical thrombectomy remains intubated for respiratory failure.  Continue weaning off sedation and extubate as per critical care team.  Check MRI scan of the brain later today.  Close neurological monitoring and strict blood pressure control  as per post thrombectomy protocol.  Continue ongoing stroke work-up.  Long discussion with the patient's daughter at the bedside and answered questions about her care.  Discussed with Dr. Shearon Stalls critical care medicine. This patient is critically ill and at significant risk of neurological worsening, death and care requires constant monitoring of vital signs, hemodynamics,respiratory and cardiac monitoring, extensive review of multiple databases, frequent neurological assessment, discussion with family, other specialists and medical decision making of high complexity.I have made any additions or clarifications directly to the above note.This critical care time does not reflect procedure time, or teaching time or supervisory time of PA/NP/Med Resident etc but could involve care discussion time.  I spent 40 minutes of neurocritical care time  in the care of  this patient.      Antony Contras, MD Medical Director [facility] Pager: [phone] 02/17/2021 5:32 PM   To contact Stroke Continuity provider, please refer to [email or web address] After hours, contact General Neurology

## 2021-02-17 NOTE — ED Provider Notes (Signed)
Critical care note  I was called in the patient's room after a mental status change.  Patient was found to be staring to the left side with right-sided hemineglect as well and is moving the right side with nonpurposeful movements.  At this time a code stroke was called and patient was taken to CT where she was found to have an acute MCA occlusion on the left.  tPA administered and patient intubated for airway protection as lung exam showed rhonchi bilaterally and patient began requiring oxygen by nasal cannula.  Patient also started on a Cleviprex drip that was held due to hypotension prior to transfer to [facility] for thrombectomy.  CRITICAL CARE Performed by: Naaman Plummer   Total critical care time: 55 minutes  Critical care time was exclusive of separately billable procedures and treating other patients.  Critical care was necessary to treat or prevent imminent or life-threatening deterioration.  Critical care was time spent personally by me on the following activities: development of treatment plan with patient and/or surrogate as well as nursing, discussions with consultants, evaluation of patient's response to treatment, examination of patient, obtaining history from patient or surrogate, ordering and performing treatments and interventions, ordering and review of laboratory studies, ordering and review of radiographic studies, pulse oximetry and re-evaluation of patient's condition.    Naaman Plummer, MD 02/17/21 [phone]

## 2021-02-17 NOTE — Progress Notes (Signed)
SLP Cancellation Note  Patient Details Name: Alisha Winters MRN: [id number] DOB: June 30, 1932   Cancelled evaluation:  SLP order received. Pt is currently still on the vent. Will follow up for evaluation post-extubation.        Calisha Tindel L. Tivis Ringer, [place] CCC/SLP Acute Rehabilitation Services Office number [phone] Pager [phone]  Juan Quam Laurice 02/17/2021, 9:08 AM

## 2021-02-17 NOTE — Progress Notes (Signed)
Initial Nutrition Assessment  DOCUMENTATION CODES:   Obesity unspecified  INTERVENTION:   -If unable to extubate, recommend:  Initiate Vital High Protein @ 30 ml/hr via OGT (720 ml daily)  90 ml Prosource TF 4 times daily  Tube feeding regimen provides 1040 kcal, 151 grams of protein, and 602 ml of H2O.    TF + celviprex provides 1616 kcals  -1 packet Juven BID via tube, each packet provides 95 calories, 2.5 grams of protein (collagen), and 9.8 grams of carbohydrate (3 grams sugar); also contains 7 grams of L-arginine and L-glutamine, 300 mg vitamin C, 15 mg vitamin E, 1.2 mcg vitamin B-12, 9.5 mg zinc, 200 mg calcium, and 1.5 g  Calcium Beta-hydroxy-Beta-methylbutyrate to support wound healing   -MVI with minerals daily via tube  NUTRITION DIAGNOSIS:   Inadequate oral intake related to inability to eat as evidenced by NPO status.  GOAL:   Provide needs based on ASPEN/SCCM guidelines  MONITOR:   Vent status, Labs, Weight trends, Skin, I & O's  REASON FOR ASSESSMENT:   Ventilator    ASSESSMENT:   This is a 88 year old female w/ an extensive medical hx as per below. Initially presented to ER 2/12 for evaluation of decreased LOC in context of blood glucose of 48 (improved w/ Dextrose administration). Was being held for social work eval given concern about her ability to self administer her insulin and check blood glucose. Nursing notes capture several incidences of confusion requiring re-orientation. On 2/13 while in ER (at [facility]) family called out to nurse sudden mental status change consisting of: left sided gaze pref and right sided hemiplegia. Code stroke was called. NIHSS score 24  CTA completed: showed distal left M1/prox M2 occlusion, tNK IV administered at 11am.  Intubated for procedure. Had some post-porcedural transient hypotension. Transferred to [facility] for Neuro-IR evaluation.. Critical care called S/p mechanical thrombectomy 2/13  to assist w/ post-procedural ICU  care.  Pt admitted with lt M1/MCA occlusion.  2/13- s/p diagnostic cerebral angiogram and mechanical thrombectomy  Patient is currently intubated on ventilator support. OGT currently clamped.  MV: 9.5 L/min Temp (24hrs), Avg:98.1 F (36.7 C), Min:97.4 F (36.3 C), Max:98.5 F (36.9 C)  Cleviprex: 12 ml/hr (provides 576 kcals daily)  Reviewed I/O's: +1.1 L x 24 hours  UOP: 435 ml x 24 hours  MAP: 76  Case discussed with MD; plan to hold off on TF for now secondary to possible extubation after MRI today.   Per [facility] notes, pt with stage 3, full thickness pressure injury to rt upper buttocks  Reviewed wt hx; noted progressive wt loss over the past few years.   Medications reviewed and include colace, miralax, and cleviprex.  Labs reviewed: CBGS: 140-210 (inpatient orders for glycemic control are 0-15 units insulin aspart every 4 hours).    Diet Order:   Diet Order             Diet NPO time specified  Diet effective now                   EDUCATION NEEDS:   Not appropriate for education at this time  Skin:  Skin Assessment: Skin Integrity Issues: Skin Integrity Issues:: Stage III Stage III: coccyx, rt upper buttocks (full thickness)  Last BM:  02/16/21  Height:   Ht Readings from Last 1 Encounters:  02/16/21 6' (1.829 m)    Weight:   Wt Readings from Last 1 Encounters:  02/15/21 106.8 kg    Ideal Body  Weight:  72.7 kg  BMI:  Body mass index is 31.93 kg/m.  Estimated Nutritional Needs:   Kcal:  0149-9692  Protein:  > 145 grams  Fluid:  > 1.2 L    Loistine Chance, RD, LDN, [place] Registered Dietitian II Certified Diabetes Care and Education Specialist Please refer to [facility] for RD and/or RD on-call/weekend/after hours pager

## 2021-02-17 NOTE — Progress Notes (Signed)
RT NOTE: RT transported patient on ventilator from room 4N25 to MRI and back to room 6V78 with no complications. Vitals are stable. RT will continue to monitor.

## 2021-02-17 NOTE — Progress Notes (Signed)
Referring Physician(s): Stroke Team.  Supervising Physician: Pedro Earls  Patient Status:  [facility] - In-pt  Chief Complaint:  Code Stroke s/pa Successful mechanical thrombectomy performed for treatment of a distal left M1/MCA occlusion with complete recanalization after 1 pass (TICI 3) by Dr. Raliegh Ip. [name]  Subjective:  Unable to assess. Patient remains intubated.   Allergies: Penicillins, Atorvastatin, Citalopram, Codeine, Meloxicam, Simvastatin, Hydrochlorothiazide, and Cephalexin  Medications: Prior to Admission medications   Medication Sig Start Date End Date Taking? Authorizing Provider  amLODipine (NORVASC) 10 MG tablet Take 10 mg by mouth daily.    [provider]  aspirin 81 MG tablet Take 81 mg by mouth daily.    [provider]  furosemide (LASIX) 20 MG tablet Take 40 mg by mouth daily. 06/28/19   [provider]  insulin NPH-regular Human (NOVOLIN 70/30) (70-30) 100 UNIT/ML injection Inject into the skin 2 (two) times daily with a meal.    [provider]  isosorbide mononitrate (IMDUR) 30 MG 24 hr tablet Take 30 mg by mouth daily.    [provider]  levofloxacin (LEVAQUIN) 500 MG tablet Take 1 tablet (500 mg total) by mouth daily for 7 days. 02/15/21 02/22/21  Harvest Dark, MD  lisinopril (PRINIVIL,ZESTRIL) 10 MG tablet Take 10 mg by mouth daily.    [provider]  metoprolol succinate (TOPROL-XL) 50 MG 24 hr tablet Take 50 mg by mouth daily. Take with or immediately following a meal.    [provider]  rosuvastatin (CRESTOR) 10 MG tablet Take 10 mg by mouth daily.    [provider]  warfarin (COUMADIN) 5 MG tablet Take 5 mg by mouth daily. 03/16/19   [provider]     Vital Signs: BP 137/62    Pulse 61    Temp 98.5 F (36.9 C) (Axillary)    Resp 16    Ht 6' (1.829 m)    SpO2 99%    BMI 31.93 kg/m   Physical Exam Vitals and nursing note reviewed.   Constitutional:      Appearance: She is well-developed.  HENT:     Head: Normocephalic and atraumatic.  Eyes:     Conjunctiva/sclera: Conjunctivae normal.  Cardiovascular:     Rate and Rhythm: Normal rate and regular rhythm.     Comments: Site is soft with no active bleeding and no appreciable pseudoaneurysm. Dressing is C/D/I  Pulmonary:     Comments: Remains intubated Musculoskeletal:        General: Normal range of motion.     Cervical back: Normal range of motion.  Skin:    General: Skin is warm.  Neurological:     Mental Status: She is alert and oriented to person, place, and time.     Comments: Alert Can spontaneously move all 4 extremities.Per bedside RN. Witnessed bilateral lower, left arm and trunk movement.  Distal pulses (DP's) palpable bilaterally with Doppler Not able to follow commands    Imaging: CT HEAD WO CONTRAST (5MM)  Result Date: 02/16/2021 CLINICAL DATA:  Stroke follow-up EXAM: CT HEAD WITHOUT CONTRAST TECHNIQUE: Contiguous axial images were obtained from the base of the skull through the vertex without intravenous contrast. RADIATION DOSE REDUCTION: This exam was performed according to the departmental dose-optimization program which includes automated exposure control, adjustment of the mA and/or kV according to patient size and/or use of iterative reconstruction technique. COMPARISON:  None. FINDINGS: Brain: There is no mass, hemorrhage or extra-axial collection. There is  generalized atrophy without lobar predilection. Hypodensity of the white matter is most commonly associated with chronic microvascular disease. Unchanged appearance of old left cerebellar infarct Vascular: Atherosclerotic calcification of the internal carotid arteries at the skull base. No abnormal hyperdensity of the major intracranial arteries or dural venous sinuses. Skull: The visualized skull base, calvarium and extracranial soft tissues are normal. Sinuses/Orbits: No fluid levels or  advanced mucosal thickening of the visualized paranasal sinuses. No mastoid or middle ear effusion. The orbits are normal. IMPRESSION: 1. No acute intracranial abnormality. 2. 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 Electronically Signed   By: Ulyses Jarred M.D.   On: 02/16/2021 23:13   MR BRAIN WO CONTRAST  Result Date: 02/17/2021 CLINICAL DATA:  Neuro deficit, acute, stroke suspected EXAM: MRI HEAD WITHOUT CONTRAST TECHNIQUE: Multiplanar, multiecho pulse sequences of the brain and surrounding structures were obtained without intravenous contrast. COMPARISON:  CT head February 16, 2021. FINDINGS: Brain: Acute or subacute left MCA territory infarct involving left frontal and parietal cortex and insula. Multiple additional small infarcts in the left occipital lobe and left cerebellum. No significant mass effect. No hydrocephalus, mass lesion, midline shift, or extra-axial fluid collection. Cerebral atrophy. Vascular: Major arterial flow voids are maintained at the skull base. Skull and upper cervical spine: Normal marrow signal. Sinuses/Orbits: Mild-to-moderate paranasal sinus mucosal thickening with small air-fluid level in left sphenoid sinus. Unremarkable orbits. Other: Small bilateral mastoid effusions. IMPRESSION: Acute or subacute left MCA territory infarct involving left frontoparietal cortex and insula. Multiple additional small infarcts in the left occipital lobe and left cerebellum. Given involvement of multiple vascular territories, consider an embolic etiology. No mass effect. Electronically Signed   By: Margaretha Sheffield M.D.   On: 02/17/2021 11:31   DG Chest Right Decubitus  Result Date: 02/16/2021 CLINICAL DATA:  Orogastric tube placement. EXAM: CHEST - RIGHT DECUBITUS COMPARISON:  None. FINDINGS: Single right lateral decubitus view. Feeding tube coursing below the diaphragm with distal tip projecting over the gastric fundus. Paucity of bowel gas. Visualized  lung bases are clear. Elevation of the right hemidiaphragm. IMPRESSION: Feeding tube with distal tip projecting over the gastric fundus. Electronically Signed   By: Keane Police D.O.   On: 02/16/2021 15:20   IR CT Head Ltd  Result Date: 02/16/2021 INDICATION: 88 year old female with past medical history significant for coronary artery disease, AFib on Coumadin , diabetes mellitus, hypertension and stroke. She presented to outside hospital with hypoglycemia on 02/15/2021. At approximately 10:35 a.m. on 02/16/2021 she developed right-sided weakness and became unresponsive. NIH SS 24; baseline modified Rankin scale. Head CT was performed showing no evidence of acute territorial infarct or hemorrhage (ASPECTS 10). CT angiogram of the head and neck showed an occlusion of the distal left M1/MCA. She was transferred to our service for a diagnostic cerebral angiogram and mechanical thrombectomy. EXAM: ULTRASOUND-GUIDED VASCULAR ACCESS DIAGNOSTIC CEREBRAL ANGIOGRAM MECHANICAL THROMBECTOMY FLAT PANEL HEAD CT COMPARISON:  CT angiogram of the head and neck February 16, 2021 MEDICATIONS: Refer to anesthesia documentation. ANESTHESIA/SEDATION: The procedure was performed under general anesthesia. CONTRAST:  64 mL of Omnipaque 300 milligram/mL FLUOROSCOPY: Radiation Exposure Index (as provided by the fluoroscopic device): 400.8 mGy Kerma COMPLICATIONS: None immediate. TECHNIQUE: Informed written consent was weight due to emergency nature of intervention. Maximal Sterile Barrier Technique was utilized including caps, mask, sterile gowns, sterile gloves, sterile drape, hand hygiene and skin antiseptic. A timeout was performed prior to the initiation of the procedure. The right groin was prepped and draped  in the usual sterile fashion. Using a micropuncture kit and the modified Seldinger technique, access was gained to the right common femoral artery and an 8 French sheath was placed. Real-time ultrasound guidance was utilized  for vascular access including the acquisition of a permanent ultrasound image documenting patency of the accessed vessel. Under fluoroscopy, a Zoom 88 guide catheter was navigated over a 6 [place] Berenstein 2 catheter and a 0.035" Terumo Glidewire into the aortic arch. Given type 3 arch, decision was made to exchange the [place] 2 catheter for a 6 French Simmons 2 catheter. The tip was reformed in the aortic arch. The catheter was placed into the left common carotid artery. Frontal and lateral angiograms of the neck were obtained. The diagnostic catheter was removed. The guide catheter was advanced over the wire into the left internal carotid artery. Frontal and lateral angiograms of the head were obtained. FINDINGS: 1. A right common femoral artery is patent and has normal caliber, adequate for vascular access. 2. Atherosclerotic changes are seen in the left carotid bifurcation without hemodynamically significant stenosis. 3. Occlusion of the distal left M1/MCA just distal to the takeoff of the anterior temporal artery. PROCEDURE: Using biplane roadmap, a zoom 71 aspiration catheter was navigated over an Aristotle 24 microguidewire into the cavernous segment of the left ICA. Attempts to navigate the catheter into the distal ICA proved unsuccessful. Under biplane roadmap, a zoom 71 aspiration catheter was navigated over a phenom 21 microcatheter and a synchro support microguidewire into the supraclinoid left ICA. The microcatheter was then navigated over the wire into the distal left M2/MCA inferior division branch. Then, a 4 x 40 mm solitaire stent retriever was deployed spanning the distal M1 and proximal M2 segment. The device was allowed to intercalated with the clot for 4 minutes. The microcatheter was removed. The aspiration catheter was advanced to the level of occlusion and connected to a penumbra aspiration pump. The thrombectomy device and aspiration catheter were removed under constant aspiration. The  guiding catheter was aspirated debris. Left internal carotid artery angiograms with magnified frontal and lateral frontal lateral views of the head followed by left anterior oblique and lateral views of the entire head showed complete recanalization of the left MCA vascular tree. No evidence of thromboembolic complication. Flat panel CT of the head was obtained and post processed in a separate workstation with concurrent attending physician supervision. Selected images were sent to PACS. No evidence of hemorrhagic complication. The guiding catheter was then retracted into the right common carotid artery under continues contrast injection. No evidence of iatrogenic injury. Right common femoral artery angiogram was obtained in right anterior oblique view. The puncture is at the level of the common femoral artery. The artery has normal caliber, adequate for closure device. The sheath was exchanged over the wire for a Perclose prostyle which was utilized for access closure. Immediate hemostasis was achieved. IMPRESSION: 1. Successful mechanical thrombectomy performed for treatment of a distal left M1/MCA occlusion with complete recanalization after 1 pass (TICI 3). 2. No evidence of hemorrhagic or thromboembolic complication. PLAN: 1. Transferred to ICU for continued care. 2. SBP 120-140 mmHg x 24 hours. Electronically Signed   By: Pedro Earls M.D.   On: 02/16/2021 14:48   IR US Guide Vasc Access Right  Result Date: 02/16/2021 INDICATION: 88 year old female with past medical history significant for coronary artery disease, AFib on Coumadin , diabetes mellitus, hypertension and stroke. She presented to outside hospital with hypoglycemia on 02/15/2021. At approximately 10:35  a.m. on 02/16/2021 she developed right-sided weakness and became unresponsive. NIH SS 24; baseline modified Rankin scale. Head CT was performed showing no evidence of acute territorial infarct or hemorrhage (ASPECTS 10). CT  angiogram of the head and neck showed an occlusion of the distal left M1/MCA. She was transferred to our service for a diagnostic cerebral angiogram and mechanical thrombectomy. EXAM: ULTRASOUND-GUIDED VASCULAR ACCESS DIAGNOSTIC CEREBRAL ANGIOGRAM MECHANICAL THROMBECTOMY FLAT PANEL HEAD CT COMPARISON:  CT angiogram of the head and neck February 16, 2021 MEDICATIONS: Refer to anesthesia documentation. ANESTHESIA/SEDATION: The procedure was performed under general anesthesia. CONTRAST:  64 mL of Omnipaque 300 milligram/mL FLUOROSCOPY: Radiation Exposure Index (as provided by the fluoroscopic device): 921.1 mGy Kerma COMPLICATIONS: None immediate. TECHNIQUE: Informed written consent was weight due to emergency nature of intervention. Maximal Sterile Barrier Technique was utilized including caps, mask, sterile gowns, sterile gloves, sterile drape, hand hygiene and skin antiseptic. A timeout was performed prior to the initiation of the procedure. The right groin was prepped and draped in the usual sterile fashion. Using a micropuncture kit and the modified Seldinger technique, access was gained to the right common femoral artery and an 8 French sheath was placed. Real-time ultrasound guidance was utilized for vascular access including the acquisition of a permanent ultrasound image documenting patency of the accessed vessel. Under fluoroscopy, a Zoom 88 guide catheter was navigated over a 6 [place] Berenstein 2 catheter and a 0.035" Terumo Glidewire into the aortic arch. Given type 3 arch, decision was made to exchange the [place] 2 catheter for a 6 French Simmons 2 catheter. The tip was reformed in the aortic arch. The catheter was placed into the left common carotid artery. Frontal and lateral angiograms of the neck were obtained. The diagnostic catheter was removed. The guide catheter was advanced over the wire into the left internal carotid artery. Frontal and lateral angiograms of the head were obtained. FINDINGS:  1. A right common femoral artery is patent and has normal caliber, adequate for vascular access. 2. Atherosclerotic changes are seen in the left carotid bifurcation without hemodynamically significant stenosis. 3. Occlusion of the distal left M1/MCA just distal to the takeoff of the anterior temporal artery. PROCEDURE: Using biplane roadmap, a zoom 71 aspiration catheter was navigated over an Aristotle 24 microguidewire into the cavernous segment of the left ICA. Attempts to navigate the catheter into the distal ICA proved unsuccessful. Under biplane roadmap, a zoom 71 aspiration catheter was navigated over a phenom 21 microcatheter and a synchro support microguidewire into the supraclinoid left ICA. The microcatheter was then navigated over the wire into the distal left M2/MCA inferior division branch. Then, a 4 x 40 mm solitaire stent retriever was deployed spanning the distal M1 and proximal M2 segment. The device was allowed to intercalated with the clot for 4 minutes. The microcatheter was removed. The aspiration catheter was advanced to the level of occlusion and connected to a penumbra aspiration pump. The thrombectomy device and aspiration catheter were removed under constant aspiration. The guiding catheter was aspirated debris. Left internal carotid artery angiograms with magnified frontal and lateral frontal lateral views of the head followed by left anterior oblique and lateral views of the entire head showed complete recanalization of the left MCA vascular tree. No evidence of thromboembolic complication. Flat panel CT of the head was obtained and post processed in a separate workstation with concurrent attending physician supervision. Selected images were sent to PACS. No evidence of hemorrhagic complication. The guiding catheter was then retracted into  the right common carotid artery under continues contrast injection. No evidence of iatrogenic injury. Right common femoral artery angiogram was  obtained in right anterior oblique view. The puncture is at the level of the common femoral artery. The artery has normal caliber, adequate for closure device. The sheath was exchanged over the wire for a Perclose prostyle which was utilized for access closure. Immediate hemostasis was achieved. IMPRESSION: 1. Successful mechanical thrombectomy performed for treatment of a distal left M1/MCA occlusion with complete recanalization after 1 pass (TICI 3). 2. No evidence of hemorrhagic or thromboembolic complication. PLAN: 1. Transferred to ICU for continued care. 2. SBP 120-140 mmHg x 24 hours. Electronically Signed   By: Pedro Earls M.D.   On: 02/16/2021 14:48   Portable Chest xray  Result Date: 02/17/2021 CLINICAL DATA:  Endotracheal tube. EXAM: PORTABLE CHEST 1 VIEW COMPARISON:  February 16, 2021. FINDINGS: Stable cardiomediastinal silhouette. Endotracheal and nasogastric tubes are unchanged in position. Right lung is clear. Minimal left basilar subsegmental atelectasis is noted. Bony thorax is unremarkable. IMPRESSION: Stable support apparatus. Minimal left basilar subsegmental atelectasis. Electronically Signed   By: Marijo Conception M.D.   On: 02/17/2021 08:30   Portable Chest x-ray  Result Date: 02/16/2021 CLINICAL DATA:  Intubated, enteric tube placement EXAM: PORTABLE CHEST 1 VIEW COMPARISON:  Chest radiograph from earlier today. FINDINGS: Endotracheal tube tip is 5.5 cm above the carina. Enteric tube terminates in the gastric fundus. Stable cardiomediastinal silhouette with top-normal heart size. No pneumothorax. No pleural effusion. No pulmonary edema. No acute consolidative airspace disease. Stable thin linear opacities at the left greater than right lung bases. IMPRESSION: 1. Well-positioned endotracheal and enteric tubes. 2. Stable thin linear opacities at the left greater than right lung bases, either scarring or atelectasis. Electronically Signed   By: Ilona Sorrel M.D.   On:  02/16/2021 15:36   DG Chest Portable 1 View  Result Date: 02/16/2021 CLINICAL DATA:  intubation EXAM: PORTABLE CHEST 1 VIEW COMPARISON:  Same day chest radiograph. FINDINGS: Low lung volumes. Endotracheal tube tip projects near the carina. Gastric tube courses to the left at the level of the mainstem bronchus. The tip does project below the diaphragm. Similar streaky bibasilar opacities. No visible pleural effusions or pneumothorax. IMPRESSION: 1. Gastric tube courses to the left at the level of the mainstem bronchus. The tip does project below the diaphragm; however, location is indeterminate on this single AP radiograph and cannot exclude the tip being in lung. Recommend replacement and re-imaging or obtaining a lateral radiograph to further evaluate location. 2. Endotracheal tube tip projects near the carina. Retraction by approximately 3.5 cm would place the tip at the level of the clavicular heads. 3. Low lung volumes with similar streaky bibasilar opacities. Findings and recommendations discussed with Dr. Leory Plowman via telephone at 12:24 p.m. Electronically Signed   By: Margaretha Sheffield M.D.   On: 02/16/2021 12:26   DG Chest Port 1 View  Result Date: 02/16/2021 CLINICAL DATA:  SOB, acute, possible aspiration EXAM: PORTABLE CHEST 1 VIEW COMPARISON:  Same day chest radiograph. FINDINGS: Low lung volumes. No confluent consolidation. Mild streaky bibasilar opacities. No visible pleural effusions or pneumothorax. Cardiomediastinal silhouette is similar to prior. IMPRESSION: Low lung volumes with mild streaky bibasilar opacities, possibly atelectasis, aspiration, and/or pneumonia. Electronically Signed   By: Margaretha Sheffield M.D.   On: 02/16/2021 12:05   DG Chest Portable 1 View  Result Date: 02/15/2021 CLINICAL DATA:  Altered mental status EXAM: PORTABLE CHEST 1 VIEW  COMPARISON:  07/14/2019 FINDINGS: Elevated right hemidiaphragm, stable. Heart is borderline in size. No confluent opacities, effusions or  edema. No acute bony abnormality. IMPRESSION: No active disease. Electronically Signed   By: Rolm Baptise M.D.   On: 02/15/2021 20:56   IR PERCUTANEOUS ART THROMBECTOMY/INFUSION INTRACRANIAL INC DIAG ANGIO  Result Date: 02/16/2021 INDICATION: 88 year old female with past medical history significant for coronary artery disease, AFib on Coumadin , diabetes mellitus, hypertension and stroke. She presented to outside hospital with hypoglycemia on 02/15/2021. At approximately 10:35 a.m. on 02/16/2021 she developed right-sided weakness and became unresponsive. NIH SS 24; baseline modified Rankin scale. Head CT was performed showing no evidence of acute territorial infarct or hemorrhage (ASPECTS 10). CT angiogram of the head and neck showed an occlusion of the distal left M1/MCA. She was transferred to our service for a diagnostic cerebral angiogram and mechanical thrombectomy. EXAM: ULTRASOUND-GUIDED VASCULAR ACCESS DIAGNOSTIC CEREBRAL ANGIOGRAM MECHANICAL THROMBECTOMY FLAT PANEL HEAD CT COMPARISON:  CT angiogram of the head and neck February 16, 2021 MEDICATIONS: Refer to anesthesia documentation. ANESTHESIA/SEDATION: The procedure was performed under general anesthesia. CONTRAST:  64 mL of Omnipaque 300 milligram/mL FLUOROSCOPY: Radiation Exposure Index (as provided by the fluoroscopic device): 161.0 mGy Kerma COMPLICATIONS: None immediate. TECHNIQUE: Informed written consent was weight due to emergency nature of intervention. Maximal Sterile Barrier Technique was utilized including caps, mask, sterile gowns, sterile gloves, sterile drape, hand hygiene and skin antiseptic. A timeout was performed prior to the initiation of the procedure. The right groin was prepped and draped in the usual sterile fashion. Using a micropuncture kit and the modified Seldinger technique, access was gained to the right common femoral artery and an 8 French sheath was placed. Real-time ultrasound guidance was utilized for vascular access  including the acquisition of a permanent ultrasound image documenting patency of the accessed vessel. Under fluoroscopy, a Zoom 88 guide catheter was navigated over a 6 [place] Berenstein 2 catheter and a 0.035" Terumo Glidewire into the aortic arch. Given type 3 arch, decision was made to exchange the [place] 2 catheter for a 6 French Simmons 2 catheter. The tip was reformed in the aortic arch. The catheter was placed into the left common carotid artery. Frontal and lateral angiograms of the neck were obtained. The diagnostic catheter was removed. The guide catheter was advanced over the wire into the left internal carotid artery. Frontal and lateral angiograms of the head were obtained. FINDINGS: 1. A right common femoral artery is patent and has normal caliber, adequate for vascular access. 2. Atherosclerotic changes are seen in the left carotid bifurcation without hemodynamically significant stenosis. 3. Occlusion of the distal left M1/MCA just distal to the takeoff of the anterior temporal artery. PROCEDURE: Using biplane roadmap, a zoom 71 aspiration catheter was navigated over an Aristotle 24 microguidewire into the cavernous segment of the left ICA. Attempts to navigate the catheter into the distal ICA proved unsuccessful. Under biplane roadmap, a zoom 71 aspiration catheter was navigated over a phenom 21 microcatheter and a synchro support microguidewire into the supraclinoid left ICA. The microcatheter was then navigated over the wire into the distal left M2/MCA inferior division branch. Then, a 4 x 40 mm solitaire stent retriever was deployed spanning the distal M1 and proximal M2 segment. The device was allowed to intercalated with the clot for 4 minutes. The microcatheter was removed. The aspiration catheter was advanced to the level of occlusion and connected to a penumbra aspiration pump. The thrombectomy device and aspiration catheter were removed under  constant aspiration. The guiding catheter was  aspirated debris. Left internal carotid artery angiograms with magnified frontal and lateral frontal lateral views of the head followed by left anterior oblique and lateral views of the entire head showed complete recanalization of the left MCA vascular tree. No evidence of thromboembolic complication. Flat panel CT of the head was obtained and post processed in a separate workstation with concurrent attending physician supervision. Selected images were sent to PACS. No evidence of hemorrhagic complication. The guiding catheter was then retracted into the right common carotid artery under continues contrast injection. No evidence of iatrogenic injury. Right common femoral artery angiogram was obtained in right anterior oblique view. The puncture is at the level of the common femoral artery. The artery has normal caliber, adequate for closure device. The sheath was exchanged over the wire for a Perclose prostyle which was utilized for access closure. Immediate hemostasis was achieved. IMPRESSION: 1. Successful mechanical thrombectomy performed for treatment of a distal left M1/MCA occlusion with complete recanalization after 1 pass (TICI 3). 2. No evidence of hemorrhagic or thromboembolic complication. PLAN: 1. Transferred to ICU for continued care. 2. SBP 120-140 mmHg x 24 hours. Electronically Signed   By: Pedro Earls M.D.   On: 02/16/2021 14:48   CT HEAD CODE STROKE WO CONTRAST  Result Date: 02/16/2021 CLINICAL DATA:  Code stroke. Altered mental status running 10 minutes ago EXAM: CT HEAD WITHOUT CONTRAST TECHNIQUE: Contiguous axial images were obtained from the base of the skull through the vertex without intravenous contrast. RADIATION DOSE REDUCTION: This exam was performed according to the departmental dose-optimization program which includes automated exposure control, adjustment of the mA and/or kV according to patient size and/or use of iterative reconstruction technique. COMPARISON:   Head CT 07/14/2019 FINDINGS: Brain: No evidence of acute infarction, hemorrhage, hydrocephalus, extra-axial collection or mass lesion/mass effect. Frontal and temporal lobe atrophy. Small left cerebellar infarction since prior but the finding chronic appearing. Mild chronic small vessel ischemia in the hemispheric white matter Vascular: No convincing hyperdense vessel. Prominent density of the left MCA on sagittal images is at the level of streak artifact and not seen on the other views. Skull: Normal. Negative for fracture or focal lesion. Sinuses/Orbits: No acute finding. Other: These results were communicated to Dr. Theda Sers at 10:53 am on 02/16/2021 by text page via the [facility] messaging system. ASPECTS [facility] Stroke Program Early CT Score) Not scored without localizing symptom IMPRESSION: 1. No acute finding. 2. Small left cerebellar infarct since 2021, but chronic appearing. 3. Frontal and temporal predominant atrophy. Electronically Signed   By: Jorje Guild M.D.   On: 02/16/2021 10:51   CT ANGIO HEAD NECK W WO CM (CODE STROKE)  Result Date: 02/16/2021 CLINICAL DATA:  Acute onset altered mental status EXAM: CT ANGIOGRAPHY HEAD AND NECK TECHNIQUE: Multidetector CT imaging of the head and neck was performed using the standard protocol during bolus administration of intravenous contrast. Multiplanar CT image reconstructions and MIPs were obtained to evaluate the vascular anatomy. Carotid stenosis measurements (when applicable) are obtained utilizing NASCET criteria, using the distal internal carotid diameter as the denominator. RADIATION DOSE REDUCTION: This exam was performed according to the departmental dose-optimization program which includes automated exposure control, adjustment of the mA and/or kV according to patient size and/or use of iterative reconstruction technique. CONTRAST:  Dose is not known on this in progress study COMPARISON:  Preceding noncontrast head CT FINDINGS: CTA NECK FINDINGS  Aortic arch: Atheromatous calcification.  Three vessel arch. Right  carotid system: Calcified plaque at the bifurcation and proximal ICA without stenosis or ulceration Left carotid system: Calcified plaque at the bifurcation with up to 40% narrowing. No ulceration or beading. Vertebral arteries: No proximal subclavian stenosis. Left dominant vertebral artery. Scattered atheromatous calcifications in the left vertebral artery, non flow reducing. No beading. Skeleton: No acute finding Other neck: No acute finding Upper chest: No acute finding Review of the MIP images confirms the above findings CTA HEAD FINDINGS Anterior circulation: Calcified plaque on the carotid siphons without flow limiting stenosis. Occlusion with abrupt cut off at the left distal M1 segment just beyond the anterior temporal branch. There is some faint downstream flow. No contralateral or anterior cerebral large vessel occlusion is seen. Posterior circulation: High-grade narrowing of the left vertebral artery at the level of the dura and near the left PICA origin with faint enhancement. Most of right vertebral artery flows to the right PICA with faint flow seen at the basilar where there is likely a stenosis. The basilar is small in the setting of bilateral fetal type PCA. No PCA occlusion or proximal stenosis noted. Venous sinuses: Negative Anatomic variants: As above Review of the MIP images confirms the above findings Critical Value/emergent results were called by telephone at the time of interpretation on 02/16/2021 at 11:05 am to provider [facility] , who verbally acknowledged these results. IMPRESSION: 1. Positive for emergent large vessel occlusion at the distal left M1 segment just beyond the anterior temporal branch. 2. Calcified plaque causes 40% narrowing at the left ICA origin. 3. Advanced narrowing of the non dominant left V4 segment. Small basilar in the setting of fetal type bilateral PCA flow. Electronically Signed   By:  Jorje Guild M.D.   On: 02/16/2021 11:09    Labs:  CBC: Recent Labs    02/15/21 2045 02/16/21 1100 02/16/21 1523  WBC 7.6 6.9  --   HGB 15.1* 12.9 12.9  HCT 47.8* 41.1 38.0  PLT 269 270  --     COAGS: Recent Labs    02/16/21 1100  INR 1.3*  APTT 29    BMP: Recent Labs    02/15/21 2045 02/16/21 1100 02/16/21 1523 02/16/21 1527 02/17/21 0954  NA 136 131* 136 137 136  K 3.5 3.6 3.6 3.7 4.0  CL 99 100  --  105 108  CO2 28 25  --  24 20*  GLUCOSE 144* 197*  --  221* 156*  BUN 12 11  --  10 17  CALCIUM 8.1* 7.5*  --  7.8* 7.6*  CREATININE 0.61 0.61  --  0.68 1.07*  GFRNONAA >60 >60  --  >60 50*    LIVER FUNCTION TESTS: Recent Labs    02/15/21 2045 02/16/21 1100  BILITOT 0.7 0.5  AST 23 16  ALT 14 11  ALKPHOS 85 69  PROT 6.7 5.5*  ALBUMIN 2.7* 2.2*    Assessment and Plan:  88 y.o. female inpatient. History of HTN, DM, CVA,  dementia, a fib ( on coumadin) presented to the ED at [facility] on 2.12.23 with decreased responsiveness and confusion.  Code [place] found to have a occlusion of the distal left M1/MCA.S/p Intervention by NIR on 2.13.23 Successful mechanical thrombectomy performed for treatment of a distal left M1/MCA occlusion with complete recanalization after 1 pass (TICI 3) by Dr. Raliegh Ip. [name]. Post intervention CT heads unremarkable.   Patient seen at bedside with Dr. Raliegh Ip. [name]. Patient remains intubated. Site is soft with no active  bleeding and no appreciable pseudoaneurysm. Dressing is C/D/I. Patient alert but not responding and does not following instructions. Per RN at bedside patient able to move all 4 extremities (currently in wrist restraints) left arm, trunk and bilateral lower extremities moving during assessment.    Further plans per neurology, NIR to continue to follow - please call with questions or concerns.  Electronically Signed: Jacqualine Mau, NP 02/17/2021, 1:47 PM   I spent a total of 15 Minutes at the  patient's bedside AND on the patient's hospital floor or unit, greater than 50% of which was counseling/coordinating care for occlusion of the distal left M1/MCA. S.pa intervention.

## 2021-02-17 NOTE — Progress Notes (Addendum)
NAME:  Alisha Winters, MRN:  [id number], DOB:  Jan 10, 1932, LOS: 1 ADMISSION DATE:  02/16/2021, CONSULTATION DATE:  2/13 REFERRING MD:  Reeves Forth, CHIEF COMPLAINT:  post-procedural critical care    History of Present Illness:  This is a 88 year old female w/ an extensive medical hx as per below. Initially presented to ER 2/12 for evaluation of decreased LOC in context of blood glucose of 48 (improved w/ Dextrose administration). Was being held for social work eval given concern about her ability to self administer her insulin and check blood glucose. Nursing notes capture several incidences of confusion requiring re-orientation. On 2/13 while in ER (at [facility]) family called out to nurse sudden mental status change consisting of: left sided gaze pref and right sided hemiplegia. Code stroke was called. NIHSS score 24  CTA completed: showed distal left M1/prox M2 occlusion, tNK IV administered at 11am.  Intubated for procedure. Had some post-porcedural transient hypotension. Transferred to [facility] for Neuro-IR evaluation.. Critical care called S/p mechanical thrombectomy 2/13  to assist w/ post-procedural ICU care.   Pertinent  Medical History  CAD Two-vessel disease with normal LVEF by cardiac cath 7/12.  Colon cancer s/p resection and chemo 1997.  IDDM Chronic anemia  Chronic afib (coumadin and metoprolol)  Hyperlipidemia  Depression  HTN (on Norvasc, lasix, Imdur, Lisinopril, metoprolol)  Stroke 2003 Bilateral cataract surg  Note extensive allergy list Significant Hospital Events: Including procedures, antibiotic start and stop dates in addition to other pertinent events   2/12 initially to ER w/ AMS 2/2 hypoglycemia. Was awaiting social work consult. Had multiple witnessed episodes of confusion requiring re-orientation  2/13 Code stroke called for sudden left gaze preference and right sided hemiparesis. NIHSS score 24. CT angio: 1. Positive for emergent large vessel occlusion at the distal left M1  segment just beyond the anterior temporal branch. 2. Calcified plaque causes 40% narrowing at the left ICA origin. 3. Advanced narrowing of the non dominant left V4 segment. Small basilar in the setting of fetal type bilateral PCA flow. tNK administered at 11am. Intubated for planned neuro-intervention. Had some brief post intubation hypotension. Transported to [facility] for IR. Admitted to Neuro ICU s/p mechanical thrombectomy of M1/MCA. Returned on vent. F/u CT brain: 1. No acute intracranial abnormality.2. 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  2/14 more awake. Not following commands. Seems to have right sided neglect. Weaning on vent. IR changing BP goals to < 180. Changed Hydralazine to PRN. Added back isosorbide.    Interim History / Subjective:   Objective   Blood pressure 124/62, pulse 64, temperature 98.5 F (36.9 C), temperature source Axillary, resp. rate 16, height 6' (1.829 m), SpO2 99 %.    Vent Mode: PSV;CPAP FiO2 (%):  [30 %-100 %] 30 % Set Rate:  [16 bmp-416 bmp] 416 bmp Vt Set:  [400 mL-580 mL] 580 mL PEEP:  [5 cmH20] 5 cmH20 Pressure Support:  [10 cmH20] 10 cmH20 Plateau Pressure:  [19 cmH20-21 cmH20] 19 cmH20   Intake/Output Summary (Last 24 hours) at 02/17/2021 0910 Last data filed at 02/17/2021 0700 Gross per 24 hour  Intake 1579.07 ml  Output 456 ml  Net 1123.07 ml   There were no vitals filed for this visit.  Examination: General: This is an obese 88 year old white female she remains on mechanical ventilation HEENT normocephalic atraumatic pupils equal reactive does appear to have left gaze preference Pulmonary: Some scattered rhonchi that improved post suctioning.  Currently on pressure support ventilation of  10 cmH2O titling near 500 mL without accessory use Cardiac: Regular irregular bradycardic rhythm no murmur rub or gallop appreciated Pcxr: ett good position. Basilar atx more on left. Little better aerated film  Abdomen  soft not tender Extremities warm dry dependent edema pulses palpable Neuro more awake today than exam yesterday.  She will localize briskly with the left, continues to have right-sided weakness, and seemingly right-sided neglect.  I cannot get her to follow commands GU: Clear yellow urine  Resolved Hospital Problem list     Assessment & Plan:  Principal Problem:   Stroke (cerebrum) ([place]) Active Problems:   Chronic a-fib ([place])   Hypertension   Coronary artery disease   Hyperlipidemia   Diabetes mellitus without complication (HCC)   Depression   Anemia   Delirium   Atelectasis   Obesity (BMI 30.0-34.9)   Pressure ulcer of coccygeal region, stage 2 ([place])  Acute Left MCA stroke s/p TPA (11 am 2/13) and successful mechanical thrombectomy 8/02, complicated by delirium and h/o depression  -risk factors: prior stroke, HTN, afib, HL -witnessed episodes of confusion documented in ER, Her INR was subtherapeutic. Also raising question is there component of underlying memory deficit or dementia. This might explain why she had recurrent stroke when she should be on coumadin  Plan Serial neuro checks SBP goal <180 Secondary stroke prevention per Neuro  F/u MRI  HTN  Plan Resumed IMDUR given h/o CAD and HTN W/ BP goal <180 will wean nicardipine and change Hydralazine to PRN  H/o two vessel CAD Chronic AF c/b bradycardia.  Witnessed run of VT  HR as low as 38 but non-sustained.  Plan Holding BB Resume Imdur Mg goal > 2/K goal > 4 Tele  AC on hold s/p TPA  Post procedural ventilator management.   Atelectasis  Intubated for procedure. Ability to protect airway not clear given pre-intubation documentation  Certainly risk for aspiration; PCT neg.  Plan Cont full vent support VAP bundle  Daily assessment for SBT; will re-assess after MRI. Not confident in her ability to protect airway so would prefer to have airway protected during MRI  Fluid and electrolyte  imbalance Plan Awaiting chem this am, replace and recheck as indicated  Decreased UOP Plan Bladder scan and chemistry  May need some gentle hydration   H/o hyperlipidemia  Plan Crestor  DM w/ hyperglycemia Plan SSI  Goal 140-180   Obesity  Plan RD consulted   Sacral pressure ulcer Plan WOC consulted  Best Practice (right click and "Reselect all SmartList Selections" daily)   Diet/type: NPO, will reassess post MRI, keep n.p.o. for now given possible extubation DVT prophylaxis: SCD GI prophylaxis: PPI Lines: N/A Foley:  N/A Code Status:  full code Last date of multidisciplinary goals of care discussion [pending ] Critical care time: 32 minutes    Erick Colace ACNP-BC [place] Pager # [phone] OR # [phone] if no answer

## 2021-02-17 NOTE — Progress Notes (Signed)
PT Cancellation Note  Patient Details Name: Alisha Winters MRN: [id number] DOB: [birth year]   Cancelled Treatment:    Reason Eval/Treat Not Completed: Patient not medically ready (sedated for MRI, intubated) Will check back tomorrow.   Leighton Roach, PT  Acute Rehab Services  Pager [phone] Office [place] 02/17/2021, 3:30 PM

## 2021-02-17 NOTE — Consult Note (Signed)
[place] Nurse wound consult note Consultation was completed by review of records, images and assistance from the bedside nurse/clinical staff.  Reason for Consult: Pressure injury Wound type: Stage 3 Pressure injury right upper buttock  wound is full thickness Pressure Injury POA: Yes Measurement: per WTA notes: 2cm x 0.5cm x 0.2cm  Wound bed:100% pink Drainage (amount, consistency, odor) none Periwound: intact Dressing procedure/placement/frequency: continue silicone foam per skin care order set to the right upper buttock  Low air loss mattress in place while in the ICU; turn and reposition patient per hospital policy    Re consult if needed, will not follow at this time. Thanks  Brieanne Mignone [employer], RN,CWOCN, CNS, [place] [phone])

## 2021-02-17 NOTE — Progress Notes (Signed)
OT Cancellation Note  Patient Details Name: Alisha Winters MRN: [id number] DOB: 01-26-1932   Cancelled Treatment:    Reason Eval/Treat Not Completed: Active bedrest order (Pt to MRI; per nsg, plans to extubate this pm. Will follow up later time.)  [place], OT/L   Acute OT Clinical Specialist [place] Pager [phone] Office [phone]   02/17/2021, 3:47 PM

## 2021-02-18 ENCOUNTER — Inpatient Hospital Stay (HOSPITAL_COMMUNITY): Payer: Medicare PPO

## 2021-02-18 DIAGNOSIS — L89152 Pressure ulcer of sacral region, stage 2: Secondary | ICD-10-CM

## 2021-02-18 DIAGNOSIS — J9601 Acute respiratory failure with hypoxia: Secondary | ICD-10-CM | POA: Diagnosis not present

## 2021-02-18 DIAGNOSIS — I482 Chronic atrial fibrillation, unspecified: Secondary | ICD-10-CM

## 2021-02-18 DIAGNOSIS — I63512 Cerebral infarction due to unspecified occlusion or stenosis of left middle cerebral artery: Secondary | ICD-10-CM | POA: Diagnosis not present

## 2021-02-18 LAB — CBC
HCT: 37.5 % (ref 36.0–46.0)
Hemoglobin: 12.2 g/dL (ref 12.0–15.0)
MCH: 27.3 pg (ref 26.0–34.0)
MCHC: 32.5 g/dL (ref 30.0–36.0)
MCV: 83.9 fL (ref 80.0–100.0)
Platelets: 250 10*3/uL (ref 150–400)
RBC: 4.47 MIL/uL (ref 3.87–5.11)
RDW: 18.2 % — ABNORMAL HIGH (ref 11.5–15.5)
WBC: 9.3 10*3/uL (ref 4.0–10.5)
nRBC: 0 % (ref 0.0–0.2)

## 2021-02-18 LAB — GLUCOSE, CAPILLARY
Glucose-Capillary: 123 mg/dL — ABNORMAL HIGH (ref 70–99)
Glucose-Capillary: 124 mg/dL — ABNORMAL HIGH (ref 70–99)
Glucose-Capillary: 126 mg/dL — ABNORMAL HIGH (ref 70–99)
Glucose-Capillary: 152 mg/dL — ABNORMAL HIGH (ref 70–99)
Glucose-Capillary: 199 mg/dL — ABNORMAL HIGH (ref 70–99)
Glucose-Capillary: 231 mg/dL — ABNORMAL HIGH (ref 70–99)

## 2021-02-18 LAB — BASIC METABOLIC PANEL
Anion gap: 9 (ref 5–15)
BUN: 19 mg/dL (ref 8–23)
CO2: 21 mmol/L — ABNORMAL LOW (ref 22–32)
Calcium: 7.7 mg/dL — ABNORMAL LOW (ref 8.9–10.3)
Chloride: 106 mmol/L (ref 98–111)
Creatinine, Ser: 0.82 mg/dL (ref 0.44–1.00)
GFR, Estimated: >60 mL/min (ref >60)
Glucose, Bld: 130 mg/dL — ABNORMAL HIGH (ref 70–99)
Potassium: 4 mmol/L (ref 3.5–5.1)
Sodium: 136 mmol/L (ref 135–145)

## 2021-02-18 LAB — PHOSPHORUS: Phosphorus: 3.8 mg/dL (ref 2.5–4.6)

## 2021-02-18 LAB — MAGNESIUM: Magnesium: 2.2 mg/dL (ref 1.7–2.4)

## 2021-02-18 LAB — PROCALCITONIN: Procalcitonin: <0.1 ng/mL

## 2021-02-18 MED ORDER — JUVEN PO PACK
1.0000 | PACK | Freq: Two times a day (BID) | ORAL | Status: DC
  Administered 2021-02-18 – 2021-02-20 (×5): 1
  Filled 2021-02-18 (×5): qty 1

## 2021-02-18 MED ORDER — FUROSEMIDE 10 MG/ML IJ SOLN
40.0000 mg | Freq: Once | INTRAMUSCULAR | Status: AC
  Administered 2021-02-18: 40 mg via INTRAVENOUS
  Filled 2021-02-18: qty 4

## 2021-02-18 MED ORDER — ORAL CARE MOUTH RINSE
15.0000 mL | Freq: Two times a day (BID) | OROMUCOSAL | Status: DC
  Administered 2021-02-19 – 2021-02-20 (×3): 15 mL via OROMUCOSAL

## 2021-02-18 MED ORDER — PROSOURCE TF PO LIQD
90.0000 mL | Freq: Two times a day (BID) | ORAL | Status: DC
  Administered 2021-02-18 – 2021-02-20 (×4): 90 mL
  Filled 2021-02-18 (×4): qty 90

## 2021-02-18 MED ORDER — JEVITY 1.5 CAL/FIBER PO LIQD
1000.0000 mL | ORAL | Status: DC
  Administered 2021-02-18 – 2021-02-19 (×2): 1000 mL
  Filled 2021-02-18 (×3): qty 1000

## 2021-02-18 MED ORDER — CHLORHEXIDINE GLUCONATE 0.12 % MT SOLN
15.0000 mL | Freq: Two times a day (BID) | OROMUCOSAL | Status: DC
  Administered 2021-02-18 – 2021-02-21 (×5): 15 mL via OROMUCOSAL
  Filled 2021-02-18 (×4): qty 15

## 2021-02-18 NOTE — Progress Notes (Signed)
OT Cancellation Note  Patient Details Name: Alisha Winters MRN: [id number] DOB: [birth year]   Cancelled Treatment:    Reason Eval/Treat Not Completed: Other (comment) (Pt extubated this am. Coretrack being placed. Will check back later time)  [place], OT/L   Acute OT Clinical Specialist [place] Pager [phone] Office [phone]  02/18/2021, 11:37 AM

## 2021-02-18 NOTE — Evaluation (Signed)
Clinical/Bedside Swallow Evaluation Patient Details  Name: Alisha Winters MRN: [id number] Date of Birth: 06/30/1932  Today's Date: 02/18/2021 Time: SLP Start Time (ACUTE ONLY): 0762 SLP Stop Time (ACUTE ONLY): 2633 SLP Time Calculation (min) (ACUTE ONLY): 10 min  Past Medical History:  Past Medical History:  Diagnosis Date   Anemia    Cancer ([place])    Coronary artery disease    Depression    Diabetes mellitus without complication ([place])    Encounter for orogastric tube placement    Hypertension    Hypotension due to drugs 02/16/2021   Stroke [facility])    Past Surgical History:  Past Surgical History:  Procedure Laterality Date   CHOLECYSTECTOMY     COLON SURGERY     EYE SURGERY     HERNIA REPAIR     HIP PINNING,CANNULATED Right 07/16/2019   Procedure: CANNULATED HIP PINNING;  Surgeon: Earnestine Leys, MD;  Location: [facility] ORS;  Service: Orthopedics;  Laterality: Right;   IR CT HEAD LTD  02/16/2021   IR PERCUTANEOUS ART THROMBECTOMY/INFUSION INTRACRANIAL INC DIAG ANGIO  02/16/2021   IR US GUIDE VASC ACCESS RIGHT  02/16/2021   RADIOLOGY WITH ANESTHESIA N/A 02/16/2021   Procedure: IR WITH ANESTHESIA;  Surgeon: Radiologist, Medication, MD;  Location: [place];  Service: Radiology;  Laterality: N/A;   HPI:  Pt is an 88 y.o. female who presented to the ED after being found unresponsive with blood glucose of 48. She was responsive and interactive with nursing staff in ED and sudden mental status change noted with left sided gaze preference and right sided hemiplegia. TNK given at [facility] transferred to [facility] for thrombectomy.  CTA showed distal left M1/prox M2 occlusion. MRI brain 2/14: Acute or subacute left MCA territory infarct involving left frontoparietal cortex and insula. Multiple additional small infarcts in the left occipital lobe and left cerebellum. Pt s/p mechanical thrombectomy 2/13 and was intubated for procedure. ETT 2/13-2/15 (0906). Cortrak placed 2/15. PMH: CVA, DM, HTN, CAD, depression.     Assessment / Plan / Recommendation  Clinical Impression  Pt was seen for bedside swallow evaluation. Oral mechanism exam was limited due to pt's difficulty following commands; however, right-sided facial weakness was noted. She exhibited symptoms of oropharyngeal dysphagia characterized by reduced labial seal and stripping, oral holding, oral residue, and signs of aspiration. She required verbal and tactile cues to demonstrate bolus manipulation and a pharyngeal delay is suspected considering time of bolus presentation versus that of any hyolaryngeal movement. She exhibited 5 swallows after verbal prompts and tactile cues and exhibited weak coughing thereafter, suggesting possible pharyngeal residue and aspiration. It is recommended that the pt's NPO status be maintained and that the Cortrak continue to be used for alimentation and meds. Pt was lethargic during the evaluation, and the impact of this on her performance is considered. SLP will follow to assess improvement in swallow function and for instrumental assessment pending progress. SLP Visit Diagnosis: Dysphagia, unspecified (R13.10)    Aspiration Risk  Moderate aspiration risk    Diet Recommendation NPO;Alternative means - temporary   Medication Administration: Via alternative means    Other  Recommendations Oral Care Recommendations: Oral care QID    Recommendations for follow up therapy are one component of a multi-disciplinary discharge planning process, led by the attending physician.  Recommendations may be updated based on patient status, additional functional criteria and insurance authorization.  Follow up Recommendations Skilled nursing-short term rehab (<3 hours/day)      Assistance Recommended at Discharge Frequent or  constant Supervision/Assistance  Functional Status Assessment Patient has had a recent decline in their functional status and demonstrates the ability to make significant improvements in function in a  reasonable and predictable amount of time.  Frequency and Duration min 2x/week  2 weeks       Prognosis Prognosis for Safe Diet Advancement: Good Barriers to Reach Goals: Cognitive deficits;Language deficits;Severity of deficits      Swallow Study   General Date of Onset: 02/17/21 HPI: Pt is an 88 y.o. female who presented to the ED after being found unresponsive with blood glucose of 48. She was responsive and interactive with nursing staff in ED and sudden mental status change noted with left sided gaze preference and right sided hemiplegia. TNK given at [facility] transferred to [facility] for thrombectomy.  CTA showed distal left M1/prox M2 occlusion. MRI brain 2/14: Acute or subacute left MCA territory infarct involving left frontoparietal cortex and insula. Multiple additional small infarcts in the left occipital lobe and left cerebellum. Pt s/p mechanical thrombectomy 2/13 and was intubated for procedure. ETT 2/13-2/15 (0906). Cortrak placed 2/15. PMH: CVA, DM, HTN, CAD, depression. Type of Study: Bedside Swallow Evaluation Previous Swallow Assessment: none Diet Prior to this Study: NPO;NG Tube Temperature Spikes Noted: No Respiratory Status: Nasal cannula History of Recent Intubation: Yes Length of Intubations (days): 2 days Date extubated: 02/18/21 Behavior/Cognition: Lethargic/Drowsy;Cooperative Oral Cavity Assessment: Within Functional Limits Oral Care Completed by SLP: No Oral Cavity - Dentition: Adequate natural dentition Vision: Functional for self-feeding Self-Feeding Abilities: Total assist Patient Positioning: Upright in bed;Postural control adequate for testing Baseline Vocal Quality: Not observed Volitional Cough: Cognitively unable to elicit Volitional Swallow: Unable to elicit    Oral/Motor/Sensory Function Overall Oral Motor/Sensory Function: Mild impairment (difficult to thoroughly assess) Facial Symmetry: Abnormal symmetry right;Suspected CN VII (facial)  dysfunction Facial Strength: Reduced right;Suspected CN VII (facial) dysfunction   Ice Chips Ice chips: Impaired Presentation: Spoon Oral Phase Impairments: Poor awareness of bolus;Reduced labial seal;Reduced lingual movement/coordination Oral Phase Functional Implications: Right anterior spillage Pharyngeal Phase Impairments: Cough - Immediate;Cough - Delayed   Thin Liquid Thin Liquid: Not tested    Nectar Thick Nectar Thick Liquid: Not tested   Honey Thick Honey Thick Liquid: Not tested   Puree Puree: Impaired Presentation: Spoon Oral Phase Impairments: Poor awareness of bolus;Reduced labial seal Oral Phase Functional Implications: Oral residue;Oral holding Pharyngeal Phase Impairments: Cough - Immediate;Cough - Delayed   Solid     Solid: Not tested     [place] Hakeem I. Hardin Negus, [place], [place] Office number [phone] Pager [phone]  [place] Marshall 02/18/2021,3:54 PM

## 2021-02-18 NOTE — Progress Notes (Signed)
EEG complete - results pending 

## 2021-02-18 NOTE — Procedures (Signed)
History: 88 yo F with worsening aphasia.   Sedation: none  Technique: This EEG was acquired with electrodes placed according to the International 10-20 electrode system (including Fp1, Fp2, F3, F4, C3, C4, P3, P4, O1, O2, T3, T4, T5, T6, A1, A2, Fz, Cz, Pz). The following electrodes were missing or displaced: none.   Background: The background consists of intermixed alpha and beta activities. There is a well defined posterior dominant rhythm of 8-9 Hz that is seen bilaterally. In addition, there is focal irregular delta activity seen in the left hemisphere, maximal in the left temporal region.   Photic stimulation: Physiologic driving is not performed  EEG Abnormalities: 1) left hemispheric lsow activity with wide field, maximal in left temporal region.   Clinical Interpretation: This is consistent with a focal non-specific cerebral disturbance of the left hemisphere. There was no seizure or seizure predisposition recorded on this study. Please note that lack of epileptiform activity on EEG does not preclude the possibility of epilepsy.   Roland Rack, MD [facility] [phone]  If 7pm- 7am, please page neurology on call as listed in [place].

## 2021-02-18 NOTE — Procedures (Signed)
Cortrak  Person Inserting Tube:  Alroy Dust, Robson Trickey L, RD Tube Type:  Cortrak - 43 inches Tube Size:  10 Tube Location:  Left nare Initial Placement:  Stomach Secured by: Bridle Technique Used to Measure Tube Placement:  Marking at nare/corner of mouth Cortrak Secured At:  65 cm  Cortrak Tube Team Note:  Consult received to place a Cortrak feeding tube.   X-ray is required, abdominal x-ray has been ordered by the Cortrak team. Please confirm tube placement before using the Cortrak tube.   If the tube becomes dislodged please keep the tube and contact the Cortrak team at [email or web address] (password [email or web address]) for replacement.  If after hours and replacement cannot be delayed, place a NG tube and confirm placement with an abdominal x-ray.    Roxana Hires, RD, LDN Clinical Dietitian See [facility] for contact information.

## 2021-02-18 NOTE — Progress Notes (Signed)
Referring Physician(s): Code stroke  Supervising Physician: Pedro Earls  Patient Status:  Alisha Winters - In-pt  Chief Complaint: Follow up left M1/MCA thrombectomy 02/17/21  Subjective:  Patient extubated, alert. Per RN intermittently follows commands, moving legs off of bed, moving right side more, aphasic, crosses midline at times (mostly when daughter is here).  Allergies: Penicillins, Atorvastatin, Citalopram, Codeine, Meloxicam, Simvastatin, Hydrochlorothiazide, and Cephalexin  Medications: Prior to Admission medications   Medication Sig Start Date End Date Taking? Authorizing Provider  amLODipine (NORVASC) 10 MG tablet Take 10 mg by mouth daily.    [provider]  aspirin 81 MG tablet Take 81 mg by mouth daily.    [provider]  furosemide (LASIX) 20 MG tablet Take 40 mg by mouth daily. 06/28/19   [provider]  insulin NPH-regular Human (NOVOLIN 70/30) (70-30) 100 UNIT/ML injection Inject into the skin 2 (two) times daily with a meal.    [provider]  isosorbide mononitrate (IMDUR) 30 MG 24 hr tablet Take 30 mg by mouth daily.    [provider]  levofloxacin (LEVAQUIN) 500 MG tablet Take 1 tablet (500 mg total) by mouth daily for 7 days. 02/15/21 02/22/21  Harvest Dark, MD  lisinopril (PRINIVIL,ZESTRIL) 10 MG tablet Take 10 mg by mouth daily.    [provider]  metoprolol succinate (TOPROL-XL) 50 MG 24 hr tablet Take 50 mg by mouth daily. Take with or immediately following a meal.    [provider]  rosuvastatin (CRESTOR) 10 MG tablet Take 10 mg by mouth daily.    [provider]  warfarin (COUMADIN) 5 MG tablet Take 5 mg by mouth daily. 03/16/19   [provider]     Vital Signs: BP (!) 178/68    Pulse 66    Temp 99 F (37.2 C) (Axillary)    Resp 19    Ht 6' (1.829 m)    SpO2 96%    BMI 31.93 kg/m   Physical Exam Cardiovascular:     Rate and Rhythm: Normal rate.      Comments: (+) Right CFA puncture site clean, dry, dressed. Soft, non-pulsatile, no active bleeding/drainage. Pulmonary:     Effort: Pulmonary effort is normal.  Skin:    General: Skin is warm and dry.  Neurological:     Mental Status: She is alert.    Imaging: CT HEAD WO CONTRAST (5MM)  Result Date: 02/16/2021 CLINICAL DATA:  Stroke follow-up EXAM: CT HEAD WITHOUT CONTRAST TECHNIQUE: Contiguous axial images were obtained from the base of the skull through the vertex without intravenous contrast. RADIATION DOSE REDUCTION: This exam was performed according to the departmental dose-optimization program which includes automated exposure control, adjustment of the mA and/or kV according to patient size and/or use of iterative reconstruction technique. COMPARISON:  None. FINDINGS: Brain: There is no mass, hemorrhage or extra-axial collection. There is generalized atrophy without lobar predilection. Hypodensity of the white matter is most commonly associated with chronic microvascular disease. Unchanged appearance of old left cerebellar infarct Vascular: Atherosclerotic calcification of the internal carotid arteries at the skull base. No abnormal hyperdensity of the major intracranial arteries or dural venous sinuses. Skull: The visualized skull base, calvarium and extracranial soft tissues are normal. Sinuses/Orbits: No fluid levels or advanced mucosal thickening of the visualized paranasal sinuses. No mastoid or middle ear effusion. The orbits are normal. IMPRESSION: 1. No acute intracranial abnormality. 2. 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 Electronically Signed  By: Ulyses Jarred M.D.   On: 02/16/2021 23:13   MR BRAIN WO CONTRAST  Result Date: 02/17/2021 CLINICAL DATA:  Neuro deficit, acute, stroke suspected EXAM: MRI HEAD WITHOUT CONTRAST TECHNIQUE: Multiplanar, multiecho pulse sequences of the brain and surrounding structures were obtained without  intravenous contrast. COMPARISON:  CT head February 16, 2021. FINDINGS: Brain: Acute or subacute left MCA territory infarct involving left frontal and parietal cortex and insula. Multiple additional small infarcts in the left occipital lobe and left cerebellum. No significant mass effect. No hydrocephalus, mass lesion, midline shift, or extra-axial fluid collection. Cerebral atrophy. Vascular: Major arterial flow voids are maintained at the skull base. Skull and upper cervical spine: Normal marrow signal. Sinuses/Orbits: Mild-to-moderate paranasal sinus mucosal thickening with small air-fluid level in left sphenoid sinus. Unremarkable orbits. Other: Small bilateral mastoid effusions. IMPRESSION: Acute or subacute left MCA territory infarct involving left frontoparietal cortex and insula. Multiple additional small infarcts in the left occipital lobe and left cerebellum. Given involvement of multiple vascular territories, consider an embolic etiology. No mass effect. Electronically Signed   By: Margaretha Sheffield M.D.   On: 02/17/2021 11:31   DG Chest Right Decubitus  Result Date: 02/16/2021 CLINICAL DATA:  Orogastric tube placement. EXAM: CHEST - RIGHT DECUBITUS COMPARISON:  None. FINDINGS: Single right lateral decubitus view. Feeding tube coursing below the diaphragm with distal tip projecting over the gastric fundus. Paucity of bowel gas. Visualized lung bases are clear. Elevation of the right hemidiaphragm. IMPRESSION: Feeding tube with distal tip projecting over the gastric fundus. Electronically Signed   By: Keane Police D.O.   On: 02/16/2021 15:20   IR CT Head Ltd  Result Date: 02/16/2021 INDICATION: 88 year old female with past medical history significant for coronary artery disease, AFib on Coumadin , diabetes mellitus, hypertension and stroke. She presented to outside hospital with hypoglycemia on 02/15/2021. At approximately 10:35 a.m. on 02/16/2021 she developed right-sided weakness and became  unresponsive. NIH SS 24; baseline modified Rankin scale. Head CT was performed showing no evidence of acute territorial infarct or hemorrhage (ASPECTS 10). CT angiogram of the head and neck showed an occlusion of the distal left M1/MCA. She was transferred to our service for a diagnostic cerebral angiogram and mechanical thrombectomy. EXAM: ULTRASOUND-GUIDED VASCULAR ACCESS DIAGNOSTIC CEREBRAL ANGIOGRAM MECHANICAL THROMBECTOMY FLAT PANEL HEAD CT COMPARISON:  CT angiogram of the head and neck February 16, 2021 MEDICATIONS: Refer to anesthesia documentation. ANESTHESIA/SEDATION: The procedure was performed under general anesthesia. CONTRAST:  64 mL of Omnipaque 300 milligram/mL FLUOROSCOPY: Radiation Exposure Index (as provided by the fluoroscopic device): 409.8 mGy Kerma COMPLICATIONS: None immediate. TECHNIQUE: Informed written consent was weight due to emergency nature of intervention. Maximal Sterile Barrier Technique was utilized including caps, mask, sterile gowns, sterile gloves, sterile drape, hand hygiene and skin antiseptic. A timeout was performed prior to the initiation of the procedure. The right groin was prepped and draped in the usual sterile fashion. Using a micropuncture kit and the modified Seldinger technique, access was gained to the right common femoral artery and an 8 French sheath was placed. Real-time ultrasound guidance was utilized for vascular access including the acquisition of a permanent ultrasound image documenting patency of the accessed vessel. Under fluoroscopy, a Zoom 88 guide catheter was navigated over a 6 [place] Berenstein 2 catheter and a 0.035" Terumo Glidewire into the aortic arch. Given type 3 arch, decision was made to exchange the [place] 2 catheter for a 6 French Simmons 2 catheter. The tip was reformed in the aortic arch.  The catheter was placed into the left common carotid artery. Frontal and lateral angiograms of the neck were obtained. The diagnostic catheter was  removed. The guide catheter was advanced over the wire into the left internal carotid artery. Frontal and lateral angiograms of the head were obtained. FINDINGS: 1. A right common femoral artery is patent and has normal caliber, adequate for vascular access. 2. Atherosclerotic changes are seen in the left carotid bifurcation without hemodynamically significant stenosis. 3. Occlusion of the distal left M1/MCA just distal to the takeoff of the anterior temporal artery. PROCEDURE: Using biplane roadmap, a zoom 71 aspiration catheter was navigated over an Aristotle 24 microguidewire into the cavernous segment of the left ICA. Attempts to navigate the catheter into the distal ICA proved unsuccessful. Under biplane roadmap, a zoom 71 aspiration catheter was navigated over a phenom 21 microcatheter and a synchro support microguidewire into the supraclinoid left ICA. The microcatheter was then navigated over the wire into the distal left M2/MCA inferior division branch. Then, a 4 x 40 mm solitaire stent retriever was deployed spanning the distal M1 and proximal M2 segment. The device was allowed to intercalated with the clot for 4 minutes. The microcatheter was removed. The aspiration catheter was advanced to the level of occlusion and connected to a penumbra aspiration pump. The thrombectomy device and aspiration catheter were removed under constant aspiration. The guiding catheter was aspirated debris. Left internal carotid artery angiograms with magnified frontal and lateral frontal lateral views of the head followed by left anterior oblique and lateral views of the entire head showed complete recanalization of the left MCA vascular tree. No evidence of thromboembolic complication. Flat panel CT of the head was obtained and post processed in a separate workstation with concurrent attending physician supervision. Selected images were sent to PACS. No evidence of hemorrhagic complication. The guiding catheter was then  retracted into the right common carotid artery under continues contrast injection. No evidence of iatrogenic injury. Right common femoral artery angiogram was obtained in right anterior oblique view. The puncture is at the level of the common femoral artery. The artery has normal caliber, adequate for closure device. The sheath was exchanged over the wire for a Perclose prostyle which was utilized for access closure. Immediate hemostasis was achieved. IMPRESSION: 1. Successful mechanical thrombectomy performed for treatment of a distal left M1/MCA occlusion with complete recanalization after 1 pass (TICI 3). 2. No evidence of hemorrhagic or thromboembolic complication. PLAN: 1. Transferred to ICU for continued care. 2. SBP 120-140 mmHg x 24 hours. Electronically Signed   By: Pedro Earls M.D.   On: 02/16/2021 14:48   IR US Guide Vasc Access Right  Result Date: 02/16/2021 INDICATION: 88 year old female with past medical history significant for coronary artery disease, AFib on Coumadin , diabetes mellitus, hypertension and stroke. She presented to outside hospital with hypoglycemia on 02/15/2021. At approximately 10:35 a.m. on 02/16/2021 she developed right-sided weakness and became unresponsive. NIH SS 24; baseline modified Rankin scale. Head CT was performed showing no evidence of acute territorial infarct or hemorrhage (ASPECTS 10). CT angiogram of the head and neck showed an occlusion of the distal left M1/MCA. She was transferred to our service for a diagnostic cerebral angiogram and mechanical thrombectomy. EXAM: ULTRASOUND-GUIDED VASCULAR ACCESS DIAGNOSTIC CEREBRAL ANGIOGRAM MECHANICAL THROMBECTOMY FLAT PANEL HEAD CT COMPARISON:  CT angiogram of the head and neck February 16, 2021 MEDICATIONS: Refer to anesthesia documentation. ANESTHESIA/SEDATION: The procedure was performed under general anesthesia. CONTRAST:  64 mL of Omnipaque  300 milligram/mL FLUOROSCOPY: Radiation Exposure Index (as  provided by the fluoroscopic device): 503.5 mGy Kerma COMPLICATIONS: None immediate. TECHNIQUE: Informed written consent was weight due to emergency nature of intervention. Maximal Sterile Barrier Technique was utilized including caps, mask, sterile gowns, sterile gloves, sterile drape, hand hygiene and skin antiseptic. A timeout was performed prior to the initiation of the procedure. The right groin was prepped and draped in the usual sterile fashion. Using a micropuncture kit and the modified Seldinger technique, access was gained to the right common femoral artery and an 8 French sheath was placed. Real-time ultrasound guidance was utilized for vascular access including the acquisition of a permanent ultrasound image documenting patency of the accessed vessel. Under fluoroscopy, a Zoom 88 guide catheter was navigated over a 6 [place] Berenstein 2 catheter and a 0.035" Terumo Glidewire into the aortic arch. Given type 3 arch, decision was made to exchange the [place] 2 catheter for a 6 French Simmons 2 catheter. The tip was reformed in the aortic arch. The catheter was placed into the left common carotid artery. Frontal and lateral angiograms of the neck were obtained. The diagnostic catheter was removed. The guide catheter was advanced over the wire into the left internal carotid artery. Frontal and lateral angiograms of the head were obtained. FINDINGS: 1. A right common femoral artery is patent and has normal caliber, adequate for vascular access. 2. Atherosclerotic changes are seen in the left carotid bifurcation without hemodynamically significant stenosis. 3. Occlusion of the distal left M1/MCA just distal to the takeoff of the anterior temporal artery. PROCEDURE: Using biplane roadmap, a zoom 71 aspiration catheter was navigated over an Aristotle 24 microguidewire into the cavernous segment of the left ICA. Attempts to navigate the catheter into the distal ICA proved unsuccessful. Under biplane roadmap, a  zoom 71 aspiration catheter was navigated over a phenom 21 microcatheter and a synchro support microguidewire into the supraclinoid left ICA. The microcatheter was then navigated over the wire into the distal left M2/MCA inferior division branch. Then, a 4 x 40 mm solitaire stent retriever was deployed spanning the distal M1 and proximal M2 segment. The device was allowed to intercalated with the clot for 4 minutes. The microcatheter was removed. The aspiration catheter was advanced to the level of occlusion and connected to a penumbra aspiration pump. The thrombectomy device and aspiration catheter were removed under constant aspiration. The guiding catheter was aspirated debris. Left internal carotid artery angiograms with magnified frontal and lateral frontal lateral views of the head followed by left anterior oblique and lateral views of the entire head showed complete recanalization of the left MCA vascular tree. No evidence of thromboembolic complication. Flat panel CT of the head was obtained and post processed in a separate workstation with concurrent attending physician supervision. Selected images were sent to PACS. No evidence of hemorrhagic complication. The guiding catheter was then retracted into the right common carotid artery under continues contrast injection. No evidence of iatrogenic injury. Right common femoral artery angiogram was obtained in right anterior oblique view. The puncture is at the level of the common femoral artery. The artery has normal caliber, adequate for closure device. The sheath was exchanged over the wire for a Perclose prostyle which was utilized for access closure. Immediate hemostasis was achieved. IMPRESSION: 1. Successful mechanical thrombectomy performed for treatment of a distal left M1/MCA occlusion with complete recanalization after 1 pass (TICI 3). 2. No evidence of hemorrhagic or thromboembolic complication. PLAN: 1. Transferred to ICU for continued care.  2. SBP  120-140 mmHg x 24 hours. Electronically Signed   By: Pedro Earls M.D.   On: 02/16/2021 14:48   Portable Chest xray  Result Date: 02/17/2021 CLINICAL DATA:  Endotracheal tube. EXAM: PORTABLE CHEST 1 VIEW COMPARISON:  February 16, 2021. FINDINGS: Stable cardiomediastinal silhouette. Endotracheal and nasogastric tubes are unchanged in position. Right lung is clear. Minimal left basilar subsegmental atelectasis is noted. Bony thorax is unremarkable. IMPRESSION: Stable support apparatus. Minimal left basilar subsegmental atelectasis. Electronically Signed   By: Marijo Conception M.D.   On: 02/17/2021 08:30   Portable Chest x-ray  Result Date: 02/16/2021 CLINICAL DATA:  Intubated, enteric tube placement EXAM: PORTABLE CHEST 1 VIEW COMPARISON:  Chest radiograph from earlier today. FINDINGS: Endotracheal tube tip is 5.5 cm above the carina. Enteric tube terminates in the gastric fundus. Stable cardiomediastinal silhouette with top-normal heart size. No pneumothorax. No pleural effusion. No pulmonary edema. No acute consolidative airspace disease. Stable thin linear opacities at the left greater than right lung bases. IMPRESSION: 1. Well-positioned endotracheal and enteric tubes. 2. Stable thin linear opacities at the left greater than right lung bases, either scarring or atelectasis. Electronically Signed   By: Ilona Sorrel M.D.   On: 02/16/2021 15:36   DG Chest Portable 1 View  Result Date: 02/16/2021 CLINICAL DATA:  intubation EXAM: PORTABLE CHEST 1 VIEW COMPARISON:  Same day chest radiograph. FINDINGS: Low lung volumes. Endotracheal tube tip projects near the carina. Gastric tube courses to the left at the level of the mainstem bronchus. The tip does project below the diaphragm. Similar streaky bibasilar opacities. No visible pleural effusions or pneumothorax. IMPRESSION: 1. Gastric tube courses to the left at the level of the mainstem bronchus. The tip does project below the diaphragm;  however, location is indeterminate on this single AP radiograph and cannot exclude the tip being in lung. Recommend replacement and re-imaging or obtaining a lateral radiograph to further evaluate location. 2. Endotracheal tube tip projects near the carina. Retraction by approximately 3.5 cm would place the tip at the level of the clavicular heads. 3. Low lung volumes with similar streaky bibasilar opacities. Findings and recommendations discussed with Dr. Leory Plowman via telephone at 12:24 p.m. Electronically Signed   By: Margaretha Sheffield M.D.   On: 02/16/2021 12:26   DG Chest Port 1 View  Result Date: 02/16/2021 CLINICAL DATA:  SOB, acute, possible aspiration EXAM: PORTABLE CHEST 1 VIEW COMPARISON:  Same day chest radiograph. FINDINGS: Low lung volumes. No confluent consolidation. Mild streaky bibasilar opacities. No visible pleural effusions or pneumothorax. Cardiomediastinal silhouette is similar to prior. IMPRESSION: Low lung volumes with mild streaky bibasilar opacities, possibly atelectasis, aspiration, and/or pneumonia. Electronically Signed   By: Margaretha Sheffield M.D.   On: 02/16/2021 12:05   DG Chest Portable 1 View  Result Date: 02/15/2021 CLINICAL DATA:  Altered mental status EXAM: PORTABLE CHEST 1 VIEW COMPARISON:  07/14/2019 FINDINGS: Elevated right hemidiaphragm, stable. Heart is borderline in size. No confluent opacities, effusions or edema. No acute bony abnormality. IMPRESSION: No active disease. Electronically Signed   By: Rolm Baptise M.D.   On: 02/15/2021 20:56   DG Abd Portable 1V  Result Date: 02/18/2021 CLINICAL DATA:  88 year old female feeding tube placement. EXAM: PORTABLE ABDOMEN - 1 VIEW COMPARISON:  None. FINDINGS: Portable AP semi upright view at 1015 hours. Enteric feeding tube courses to the left upper quadrant, and then across midline to the right with tip projecting over the inferior liver shadow. Paucity of bowel gas  in the upper abdomen. Several right abdominal surgical  clips. Mildly elevated right hemidiaphragm, otherwise negative visible lung bases. Disc and endplate degeneration in the visible spine. IMPRESSION: Enteric feeding tube terminates in the right upper quadrant, compatible with tip at the proximal duodenum. Electronically Signed   By: Genevie Ann M.D.   On: 02/18/2021 10:32   ECHOCARDIOGRAM COMPLETE  Result Date: 02/17/2021    ECHOCARDIOGRAM REPORT   Patient Name:   Alisha Winters Date of Exam: 02/17/2021 Medical Rec #:  [id number]        Height:       72.0 in Accession #:    [id number]       Weight:       235.4 lb Date of Birth:  11/10/1932        BSA:          2.284 m Patient Age:    24 years         BP:           123/57 mmHg Patient Gender: F                HR:           62 bpm. Exam Location:  Inpatient Procedure: 2D Echo, Cardiac Doppler and Color Doppler Indications:    I63.9 STROKE  History:        Patient has prior history of Echocardiogram examinations, most                 recent 07/15/2019. Stroke; Risk Factors:Hypertension, Diabetes                 and Family History of Coronary Artery Disease. CA / CAD.  Sonographer:    Beryle Beams Referring Phys: [id number] [place]  Sonographer Comments: No subcostal window, patient is morbidly obese and echo performed with patient supine and on artificial respirator. IMPRESSIONS  1. Left ventricular ejection fraction, by estimation, is 60 to 65%. The left ventricle has normal function. Left ventricular endocardial border not optimally defined to evaluate regional wall motion. Left ventricular diastolic function could not be evaluated.  2. Right ventricular systolic function is normal. The right ventricular size is normal. Tricuspid regurgitation signal is inadequate for assessing PA pressure.  3. The mitral valve is grossly normal. No evidence of mitral valve regurgitation. No evidence of mitral stenosis.  4. The aortic valve is grossly normal. Aortic valve regurgitation is not visualized. No aortic stenosis is  present. Conclusion(s)/Recommendation(s): No intracardiac source of embolism detected on this transthoracic study. Consider a transesophageal echocardiogram to exclude cardiac source of embolism if clinically indicated. FINDINGS  Left Ventricle: Left ventricular ejection fraction, by estimation, is 60 to 65%. The left ventricle has normal function. Left ventricular endocardial border not optimally defined to evaluate regional wall motion. The left ventricular internal cavity size was normal in size. There is no left ventricular hypertrophy. Left ventricular diastolic function could not be evaluated due to nondiagnostic images. Left ventricular diastolic function could not be evaluated. Right Ventricle: No subviews. The right ventricular size is normal. No increase in right ventricular wall thickness. Right ventricular systolic function is normal. Tricuspid regurgitation signal is inadequate for assessing PA pressure. Left Atrium: Left atrial size was normal in size. Right Atrium: Right atrial size was normal in size. Pericardium: Trivial pericardial effusion is present. Mitral Valve: The mitral valve is grossly normal. No evidence of mitral valve regurgitation. No evidence of mitral valve stenosis. Tricuspid Valve: The tricuspid valve is  grossly normal. Tricuspid valve regurgitation is not demonstrated. No evidence of tricuspid stenosis. Aortic Valve: The aortic valve is grossly normal. Aortic valve regurgitation is not visualized. No aortic stenosis is present. Aortic valve mean gradient measures 3.0 mmHg. Aortic valve peak gradient measures 6.2 mmHg. Aortic valve area, by VTI measures 2.44 cm. Pulmonic Valve: The pulmonic valve was grossly normal. Pulmonic valve regurgitation is not visualized. No evidence of pulmonic stenosis. Aorta: The aortic root and ascending aorta are structurally normal, with no evidence of dilitation. Venous: The inferior vena cava was not well visualized. IAS/Shunts: The atrial septum is  grossly normal.  LEFT VENTRICLE PLAX 2D LVIDd:         4.00 cm     Diastology LVIDs:         2.50 cm     LV e' medial:  4.97 cm/s LV PW:         1.10 cm     LV e' lateral: 7.96 cm/s LV IVS:        0.80 cm LVOT diam:     2.30 cm LV SV:         50 LV SV Index:   22 LVOT Area:     4.15 cm  LV Volumes (MOD) LV vol d, MOD A2C: 57.5 ml LV vol d, MOD A4C: 74.1 ml LV vol s, MOD A2C: 30.4 ml LV vol s, MOD A4C: 26.4 ml LV SV MOD A2C:     27.1 ml LV SV MOD A4C:     74.1 ml LV SV MOD BP:      37.2 ml RIGHT VENTRICLE RV S prime:     18.40 cm/s TAPSE (M-mode): 1.2 cm LEFT ATRIUM             Index        RIGHT ATRIUM           Index LA diam:        3.70 cm 1.62 cm/m   RA Area:     12.90 cm LA Vol (A2C):   56.7 ml 24.83 ml/m  RA Volume:   29.60 ml  12.96 ml/m LA Vol (A4C):   46.4 ml 20.32 ml/m LA Biplane Vol: 53.6 ml 23.47 ml/m  AORTIC VALVE                    PULMONIC VALVE AV Area (Vmax):    2.61 cm     PV Vmax:       0.83 m/s AV Area (Vmean):   2.48 cm     PV Vmean:      48.600 cm/s AV Area (VTI):     2.44 cm     PV VTI:        0.116 m AV Vmax:           125.00 cm/s  PV Peak grad:  2.8 mmHg AV Vmean:          85.000 cm/s  PV Mean grad:  1.0 mmHg AV VTI:            0.204 m AV Peak Grad:      6.2 mmHg AV Mean Grad:      3.0 mmHg LVOT Vmax:         78.60 cm/s LVOT Vmean:        50.800 cm/s LVOT VTI:          0.120 m LVOT/AV VTI ratio: 0.59  AORTA Ao Root diam: 2.90 cm Ao Asc diam:  2.70 cm  SHUNTS Systemic VTI:  0.12 m Systemic Diam: 2.30 cm Eleonore Chiquito MD Electronically signed by Eleonore Chiquito MD Signature Date/Time: 02/17/2021/2:25:21 PM    Final    IR PERCUTANEOUS ART THROMBECTOMY/INFUSION INTRACRANIAL INC DIAG ANGIO  Result Date: 02/16/2021 INDICATION: 88 year old female with past medical history significant for coronary artery disease, AFib on Coumadin , diabetes mellitus, hypertension and stroke. She presented to outside hospital with hypoglycemia on 02/15/2021. At approximately 10:35 a.m. on 02/16/2021 she  developed right-sided weakness and became unresponsive. NIH SS 24; baseline modified Rankin scale. Head CT was performed showing no evidence of acute territorial infarct or hemorrhage (ASPECTS 10). CT angiogram of the head and neck showed an occlusion of the distal left M1/MCA. She was transferred to our service for a diagnostic cerebral angiogram and mechanical thrombectomy. EXAM: ULTRASOUND-GUIDED VASCULAR ACCESS DIAGNOSTIC CEREBRAL ANGIOGRAM MECHANICAL THROMBECTOMY FLAT PANEL HEAD CT COMPARISON:  CT angiogram of the head and neck February 16, 2021 MEDICATIONS: Refer to anesthesia documentation. ANESTHESIA/SEDATION: The procedure was performed under general anesthesia. CONTRAST:  64 mL of Omnipaque 300 milligram/mL FLUOROSCOPY: Radiation Exposure Index (as provided by the fluoroscopic device): 161.0 mGy Kerma COMPLICATIONS: None immediate. TECHNIQUE: Informed written consent was weight due to emergency nature of intervention. Maximal Sterile Barrier Technique was utilized including caps, mask, sterile gowns, sterile gloves, sterile drape, hand hygiene and skin antiseptic. A timeout was performed prior to the initiation of the procedure. The right groin was prepped and draped in the usual sterile fashion. Using a micropuncture kit and the modified Seldinger technique, access was gained to the right common femoral artery and an 8 French sheath was placed. Real-time ultrasound guidance was utilized for vascular access including the acquisition of a permanent ultrasound image documenting patency of the accessed vessel. Under fluoroscopy, a Zoom 88 guide catheter was navigated over a 6 [place] Berenstein 2 catheter and a 0.035" Terumo Glidewire into the aortic arch. Given type 3 arch, decision was made to exchange the [place] 2 catheter for a 6 French Simmons 2 catheter. The tip was reformed in the aortic arch. The catheter was placed into the left common carotid artery. Frontal and lateral angiograms of the neck  were obtained. The diagnostic catheter was removed. The guide catheter was advanced over the wire into the left internal carotid artery. Frontal and lateral angiograms of the head were obtained. FINDINGS: 1. A right common femoral artery is patent and has normal caliber, adequate for vascular access. 2. Atherosclerotic changes are seen in the left carotid bifurcation without hemodynamically significant stenosis. 3. Occlusion of the distal left M1/MCA just distal to the takeoff of the anterior temporal artery. PROCEDURE: Using biplane roadmap, a zoom 71 aspiration catheter was navigated over an Aristotle 24 microguidewire into the cavernous segment of the left ICA. Attempts to navigate the catheter into the distal ICA proved unsuccessful. Under biplane roadmap, a zoom 71 aspiration catheter was navigated over a phenom 21 microcatheter and a synchro support microguidewire into the supraclinoid left ICA. The microcatheter was then navigated over the wire into the distal left M2/MCA inferior division branch. Then, a 4 x 40 mm solitaire stent retriever was deployed spanning the distal M1 and proximal M2 segment. The device was allowed to intercalated with the clot for 4 minutes. The microcatheter was removed. The aspiration catheter was advanced to the level of occlusion and connected to a penumbra aspiration pump. The thrombectomy device and aspiration catheter were removed under constant aspiration. The guiding catheter was aspirated debris. Left internal  carotid artery angiograms with magnified frontal and lateral frontal lateral views of the head followed by left anterior oblique and lateral views of the entire head showed complete recanalization of the left MCA vascular tree. No evidence of thromboembolic complication. Flat panel CT of the head was obtained and post processed in a separate workstation with concurrent attending physician supervision. Selected images were sent to PACS. No evidence of hemorrhagic  complication. The guiding catheter was then retracted into the right common carotid artery under continues contrast injection. No evidence of iatrogenic injury. Right common femoral artery angiogram was obtained in right anterior oblique view. The puncture is at the level of the common femoral artery. The artery has normal caliber, adequate for closure device. The sheath was exchanged over the wire for a Perclose prostyle which was utilized for access closure. Immediate hemostasis was achieved. IMPRESSION: 1. Successful mechanical thrombectomy performed for treatment of a distal left M1/MCA occlusion with complete recanalization after 1 pass (TICI 3). 2. No evidence of hemorrhagic or thromboembolic complication. PLAN: 1. Transferred to ICU for continued care. 2. SBP 120-140 mmHg x 24 hours. Electronically Signed   By: Pedro Earls M.D.   On: 02/16/2021 14:48   CT HEAD CODE STROKE WO CONTRAST  Result Date: 02/16/2021 CLINICAL DATA:  Code stroke. Altered mental status running 10 minutes ago EXAM: CT HEAD WITHOUT CONTRAST TECHNIQUE: Contiguous axial images were obtained from the base of the skull through the vertex without intravenous contrast. RADIATION DOSE REDUCTION: This exam was performed according to the departmental dose-optimization program which includes automated exposure control, adjustment of the mA and/or kV according to patient size and/or use of iterative reconstruction technique. COMPARISON:  Head CT 07/14/2019 FINDINGS: Brain: No evidence of acute infarction, hemorrhage, hydrocephalus, extra-axial collection or mass lesion/mass effect. Frontal and temporal lobe atrophy. Small left cerebellar infarction since prior but the finding chronic appearing. Mild chronic small vessel ischemia in the hemispheric white matter Vascular: No convincing hyperdense vessel. Prominent density of the left MCA on sagittal images is at the level of streak artifact and not seen on the other views. Skull:  Normal. Negative for fracture or focal lesion. Sinuses/Orbits: No acute finding. Other: These results were communicated to Dr. Theda Sers at 10:53 am on 02/16/2021 by text page via the [facility] messaging system. ASPECTS [facility] Stroke Program Early CT Score) Not scored without localizing symptom IMPRESSION: 1. No acute finding. 2. Small left cerebellar infarct since 2021, but chronic appearing. 3. Frontal and temporal predominant atrophy. Electronically Signed   By: Jorje Guild M.D.   On: 02/16/2021 10:51   CT ANGIO HEAD NECK W WO CM (CODE STROKE)  Result Date: 02/16/2021 CLINICAL DATA:  Acute onset altered mental status EXAM: CT ANGIOGRAPHY HEAD AND NECK TECHNIQUE: Multidetector CT imaging of the head and neck was performed using the standard protocol during bolus administration of intravenous contrast. Multiplanar CT image reconstructions and MIPs were obtained to evaluate the vascular anatomy. Carotid stenosis measurements (when applicable) are obtained utilizing NASCET criteria, using the distal internal carotid diameter as the denominator. RADIATION DOSE REDUCTION: This exam was performed according to the departmental dose-optimization program which includes automated exposure control, adjustment of the mA and/or kV according to patient size and/or use of iterative reconstruction technique. CONTRAST:  Dose is not known on this in progress study COMPARISON:  Preceding noncontrast head CT FINDINGS: CTA NECK FINDINGS Aortic arch: Atheromatous calcification.  Three vessel arch. Right carotid system: Calcified plaque at the bifurcation and proximal ICA without  stenosis or ulceration Left carotid system: Calcified plaque at the bifurcation with up to 40% narrowing. No ulceration or beading. Vertebral arteries: No proximal subclavian stenosis. Left dominant vertebral artery. Scattered atheromatous calcifications in the left vertebral artery, non flow reducing. No beading. Skeleton: No acute finding Other neck: No  acute finding Upper chest: No acute finding Review of the MIP images confirms the above findings CTA HEAD FINDINGS Anterior circulation: Calcified plaque on the carotid siphons without flow limiting stenosis. Occlusion with abrupt cut off at the left distal M1 segment just beyond the anterior temporal branch. There is some faint downstream flow. No contralateral or anterior cerebral large vessel occlusion is seen. Posterior circulation: High-grade narrowing of the left vertebral artery at the level of the dura and near the left PICA origin with faint enhancement. Most of right vertebral artery flows to the right PICA with faint flow seen at the basilar where there is likely a stenosis. The basilar is small in the setting of bilateral fetal type PCA. No PCA occlusion or proximal stenosis noted. Venous sinuses: Negative Anatomic variants: As above Review of the MIP images confirms the above findings Critical Value/emergent results were called by telephone at the time of interpretation on 02/16/2021 at 11:05 am to provider [facility] , who verbally acknowledged these results. IMPRESSION: 1. Positive for emergent large vessel occlusion at the distal left M1 segment just beyond the anterior temporal branch. 2. Calcified plaque causes 40% narrowing at the left ICA origin. 3. Advanced narrowing of the non dominant left V4 segment. Small basilar in the setting of fetal type bilateral PCA flow. Electronically Signed   By: Jorje Guild M.D.   On: 02/16/2021 11:09    Labs:  CBC: Recent Labs    02/15/21 2045 02/16/21 1100 02/16/21 1523 02/18/21 0720  WBC 7.6 6.9  --  9.3  HGB 15.1* 12.9 12.9 12.2  HCT 47.8* 41.1 38.0 37.5  PLT 269 270  --  250    COAGS: Recent Labs    02/16/21 1100  INR 1.3*  APTT 29    BMP: Recent Labs    02/16/21 1100 02/16/21 1523 02/16/21 1527 02/17/21 0954 02/18/21 0720  NA 131* 136 137 136 136  K 3.6 3.6 3.7 4.0 4.0  CL 100  --  105 108 106  CO2 25  --  24 20* 21*   GLUCOSE 197*  --  221* 156* 130*  BUN 11  --  10 17 19   CALCIUM 7.5*  --  7.8* 7.6* 7.7*  CREATININE 0.61  --  0.68 1.07* 0.82  GFRNONAA >60  --  >60 50* >60    LIVER FUNCTION TESTS: Recent Labs    02/15/21 2045 02/16/21 1100  BILITOT 0.7 0.5  AST 23 16  ALT 14 11  ALKPHOS 85 69  PROT 6.7 5.5*  ALBUMIN 2.7* 2.2*    Assessment and Plan:  88 y/o F s/p left M1/MCA thrombectomy 2/13 in NIR seen today for follow up.  Patient extubated this morning, aphasic, moving all 4 extremities, crosses midline.  Right CFA puncture site unremarkable - routine wound care until healed, do not submerge x 7 days.  Further plans per neurology, NIR remains available as needed.  Electronically Signed: Joaquim Nam, PA-C 02/18/2021, 11:42 AM   I spent a total of 15 Minutes at the the patient's bedside AND on the patient's hospital floor or unit, greater than 50% of which was counseling/coordinating care for code stroke follow up.

## 2021-02-18 NOTE — Consult Note (Incomplete)
NAME:  Alisha [facility], MRN:  [id number], DOB:  05-05-1932, LOS: 2 ADMISSION DATE:  02/16/2021, CONSULTATION DATE:  *** REFERRING MD:  ***, CHIEF COMPLAINT:  ***   History of Present Illness:  Currently intubated. Patient history received from chart review.  88yo F presented to [facility] ED on 02/15/21 with decreased responsiveness/confusion. Per EMS, called to patient's home for confusion/AMS. Patient found unresponsive with a blood glucose of 48 for which D10 was given in route to hospital. Became more awake and alert oriented to person and place only. Upon arrival 185 blood glucose, found to be bradycardia A-fib on EKG and mildly hypothermic (33F rectal) Bair hugger placed. Remained somewhat confused, unclear of baseline.   Transferred to [facility] for Neuro-IR evaluation. Critical care called s/p mechanical thrombectomy 2/13 to assist with post/procedural ICU care.  Pertinent  Medical History  IDDM, CVA 2003, HTN, chronic anemia, colon cancer s/p resection and chemo 1997, CAD, depression, chronic Afib coumadin and metoprolol), Hyperlipidemia, CAD Two-vessel disease with normal LVEF by cardiac cath 07/15/20  Significant Hospital Events: Including procedures, antibiotic start and stop dates in addition to other pertinent events   02/13 initially responsive and interactive with nursing staff in ED. Suddenly acutely unresponsive with left gaze. LKW 1035. CODE STROKE called at 1038. Noted left gaze preference, right sided weakness and aphasia. NIHSS 24. CT, CTA head and neck. Small left cerebellar infarct since 2021, chronic. Distal left M1/Proximal M2 occlusion. Received tNK at [facility] at 1100. Transfer to [facility] for left M1 thrombectomy. Intubated at [facility] for airway protection/planned neuro-intervention. Transferred to [facility] for Neuro-IR evaluation. Mechanical thrombectomy with combined stent retriever and aspiration with complete recanalization. Remains on vent and sedated post procedure. 2/14 No adverse events.  Remains MCV. MRI completed - acute/subacute left MCA territory infarct involving left frontoparietal cortex and insula. Multiple additional small infacts in the left occipital lobe and left cerebellum. 2/15 Passed SAT/SBT. Extubated 0906 to 3L [place]. No stridor noted.   Interim History / Subjective:  Intubated, responsive to verbal stimulus. Intermittent to follow commands.  Objective   Blood pressure (!) 164/72, pulse (!) 58, temperature 97.9 F (36.6 C), temperature source Axillary, resp. rate 16, height 6' (1.829 m), SpO2 99 %.    Vent Mode: PRVC FiO2 (%):  [30 %] 30 % Set Rate:  [16 bmp] 16 bmp Vt Set:  [580 mL] 580 mL PEEP:  [5 cmH20] 5 cmH20 Pressure Support:  [10 cmH20] 10 cmH20 Plateau Pressure:  [15 cmH20-19 cmH20] 16 cmH20   Intake/Output Summary (Last 24 hours) at 02/18/2021 0718 Last data filed at 02/18/2021 8416 Gross per 24 hour  Intake 832.76 ml  Output 344 ml  Net 488.76 ml   There were no vitals filed for this visit.  Examination: General: 21 yof resting in bed on 3L [place]. No distress but mild increased WOB  HENT: NCAT. No JVD Lungs: Clear RR 22; extubated 2/15 to 3L [place] Cardiovascular: Afib bradycardia HR 50's Abdomen: soft rounded, nontender hypoactive bowel sounds Extremities: warm and dry Neuro: awake, spontaneous eye opening, intermittent follows commands GU: decreased UOP; foley catheter draining to BSB  Resolved Hospital Problem list     Assessment & Plan:  Principal Problem:   Stroke (cerebrum) ([place]) Active Problems:   Coronary artery disease   Depression   Diabetes mellitus without complication ([place])   Hypertension   Anemia   Chronic a-fib (HCC)   Hyperlipidemia   Delirium   Atelectasis   Obesity (BMI 30.0-34.9)   Pressure ulcer  of coccygeal region, stage 2 ([place])   Acute ischemic stroke LVO to Distal left M1/Proximal M2 occlusion s/p tNK (11am 2/13) and mechanical thrombectomy 2/13 likely an element of Afib (not on compliant with coumadin)  complicated by confusion/delirium (unclear of baseline) h/o depression Plan EEG to rule out seizure Hold coumadin for now Assess for bleeding or hematoma  MCV for airway protection/planned neuro-intervention Postprocedural respiratory failure Atelectasis  Plan Extubated 2/15 to 3L [place] Continue supplemental oxygen PRN; continue to wean for O2>92% IS and acapella  Duoneb PRN   AKI : 2/14 increase in creatinine (1.07) from baseline 0.82-0.61 likely from dehydration, no PO intake/hypoperfusion, can not rule out effect from contrast Decreased urine output Plan NS at 33ml/hr RD to place cortrak for nutrition Diuresis PRN  Fluid and electrolyte imbalance Plan Replace potassium/magnesium PRN  HTN: chronic Plan Hydralazine PRN  H/o two vessel CAD Chronic Afib c/b bradycardia Plan Hold coumadin for now Aspirin 81mg  Isordil 15mg  BID  HLD: Chronic Plan  Resume home crestor 10mg  daily  DM w/hypoglycemia POA Hyperglycemia Plan Blood glucose monitor SSI  Obesity Plan Supportive therapy  Coccyx Medial Stage 2 pressure ulcer POA Plan WOC consult placed See WOC nurse recommendations  Best Practice (right click and "Reselect all SmartList Selections" daily)   Diet/type: NPO w/ meds via tube DVT prophylaxis: {anticoagulation (Optional):25687} GI prophylaxis: PPI Lines: N/A Foley:  Yes, and it is still needed Code Status:  DNR Last date of multidisciplinary goals of care discussion []   Labs   CBC: Recent Labs  Lab 02/15/21 2045 02/16/21 1100 02/16/21 1523  WBC 7.6 6.9  --   NEUTROABS  --  4.6  --   HGB 15.1* 12.9 12.9  HCT 47.8* 41.1 38.0  MCV 86.0 86.2  --   PLT 269 270  --     Basic Metabolic Panel: Recent Labs  Lab 02/15/21 2045 02/16/21 1100 02/16/21 1523 02/16/21 1527 02/17/21 0954  NA 136 131* 136 137 136  K 3.5 3.6 3.6 3.7 4.0  CL 99 100  --  105 108  CO2 28 25  --  24 20*  GLUCOSE 144* 197*  --  221* 156*  BUN 12 11  --  10 17   CREATININE 0.61 0.61  --  0.68 1.07*  CALCIUM 8.1* 7.5*  --  7.8* 7.6*  MG  --   --   --  1.9  --    GFR: Estimated Creatinine Clearance: 50.6 mL/min (A) (by C-G formula based on SCr of 1.07 mg/dL (H)). Recent Labs  Lab 02/15/21 2045 02/16/21 1100 02/16/21 1520 02/17/21 0529  PROCALCITON  --   --  <0.10 <0.10  WBC 7.6 6.9  --   --   LATICACIDVEN 1.5  --   --   --     Liver Function Tests: Recent Labs  Lab 02/15/21 2045 02/16/21 1100  AST 23 16  ALT 14 11  ALKPHOS 85 69  BILITOT 0.7 0.5  PROT 6.7 5.5*  ALBUMIN 2.7* 2.2*   No results for input(s): LIPASE, AMYLASE in the last 168 hours. No results for input(s): AMMONIA in the last 168 hours.  ABG    Component Value Date/Time   PHART 7.469 (H) 02/16/2021 1523   PCO2ART 40.4 02/16/2021 1523   PO2ART 469 (H) 02/16/2021 1523   HCO3 29.3 (H) 02/16/2021 1523   TCO2 31 02/16/2021 1523   O2SAT 100.0 02/16/2021 1523     Coagulation Profile: Recent Labs  Lab 02/16/21 1100  INR  1.3*    Cardiac Enzymes: No results for input(s): CKTOTAL, CKMB, CKMBINDEX, TROPONINI in the last 168 hours.  HbA1C: Hgb A1c MFr Bld  Date/Time Value Ref Range Status  02/16/2021 03:20 PM 6.2 (H) 4.8 - 5.6 % Final    Comment:    (NOTE) Pre diabetes:          5.7%-6.4%  Diabetes:              >6.4%  Glycemic control for   <7.0% adults with diabetes   07/15/2019 10:56 AM 8.8 (H) 4.8 - 5.6 % Final    Comment:    (NOTE) Pre diabetes:          5.7%-6.4%  Diabetes:              >6.4%  Glycemic control for   <7.0% adults with diabetes     CBG: Recent Labs  Lab 02/17/21 1146 02/17/21 1550 02/17/21 2001 02/17/21 2337 02/18/21 0406  GLUCAP 129* 130* 113* 123* 124*    Review of Systems:     Past Medical History:  She,  has a past medical history of Anemia, Cancer ([place]), Coronary artery disease, Depression, Diabetes mellitus without complication ([place]), Encounter for orogastric tube placement, Hypertension, Hypotension due to  drugs (02/16/2021), and Stroke ([place]).   Surgical History:   Past Surgical History:  Procedure Laterality Date   CHOLECYSTECTOMY     COLON SURGERY     EYE SURGERY     HERNIA REPAIR     HIP PINNING,CANNULATED Right 07/16/2019   Procedure: CANNULATED HIP PINNING;  Surgeon: Earnestine Leys, MD;  Location: [facility] ORS;  Service: Orthopedics;  Laterality: Right;   IR CT HEAD LTD  02/16/2021   IR PERCUTANEOUS ART THROMBECTOMY/INFUSION INTRACRANIAL INC DIAG ANGIO  02/16/2021   IR US GUIDE VASC ACCESS RIGHT  02/16/2021   RADIOLOGY WITH ANESTHESIA N/A 02/16/2021   Procedure: IR WITH ANESTHESIA;  Surgeon: Radiologist, Medication, MD;  Location: [place];  Service: Radiology;  Laterality: N/A;     Social History:   reports that she has never smoked. She has never used smokeless tobacco. She reports that she does not drink alcohol and does not use drugs.   Family History:  Her family history is not on file.   Allergies Allergies  Allergen Reactions   Penicillins Rash   Atorvastatin Diarrhea   Citalopram Nausea Only   Codeine Itching   Meloxicam Nausea Only   Simvastatin Other (See Comments)    Muscle pain   Hydrochlorothiazide Other (See Comments)   Cephalexin Nausea Only     Home Medications  Prior to Admission medications   Medication Sig Start Date End Date Taking? Authorizing Provider  amLODipine (NORVASC) 10 MG tablet Take 10 mg by mouth daily.    [provider]  aspirin 81 MG tablet Take 81 mg by mouth daily.    [provider]  furosemide (LASIX) 20 MG tablet Take 40 mg by mouth daily. 06/28/19   [provider]  insulin NPH-regular Human (NOVOLIN 70/30) (70-30) 100 UNIT/ML injection Inject into the skin 2 (two) times daily with a meal.    [provider]  isosorbide mononitrate (IMDUR) 30 MG 24 hr tablet Take 30 mg by mouth daily.    [provider]  levofloxacin (LEVAQUIN) 500 MG tablet Take 1 tablet (500 mg total) by mouth daily for 7 days.  02/15/21 02/22/21  Harvest Dark, MD  lisinopril (PRINIVIL,ZESTRIL) 10 MG tablet Take 10 mg by mouth daily.  [provider]  metoprolol succinate (TOPROL-XL) 50 MG 24 hr tablet Take 50 mg by mouth daily. Take with or immediately following a meal.    [provider]  rosuvastatin (CRESTOR) 10 MG tablet Take 10 mg by mouth daily.    [provider]  warfarin (COUMADIN) 5 MG tablet Take 5 mg by mouth daily. 03/16/19   [provider]     Critical care time:

## 2021-02-18 NOTE — Progress Notes (Signed)
Nutrition Follow-up  DOCUMENTATION CODES:   Obesity unspecified  INTERVENTION:   Initiate tube feeding via Cortrak tube: Jevity 1.5 at 20 ml/h and increase by 10 ml every 8 hours to goal rate of 50 ml/hr (1200 ml per day) Prosource TF 90 ml BID  Provides 1960 kcal, 120 gm protein, 912 ml free water daily  Juven BID via Cortrak tube   NUTRITION DIAGNOSIS:   Inadequate oral intake related to inability to eat as evidenced by NPO status. Ongoing.   GOAL:   Provide needs based on ASPEN/SCCM guidelines Progressing with TF initiation   MONITOR:   Vent status, Labs, Weight trends, Skin, I & O's  REASON FOR ASSESSMENT:   Consult Enteral/tube feeding initiation and management  ASSESSMENT:   Pt with PMH of CAD, IDDM, chronic anemia, HLD, Depression, HTN, colon ca s/p resection (1997) presented to ED for hypoglycemia pt developed R sided weakness and admitted for acute L MCA stroke.  Pt discussed during ICU rounds and with RN. Per therapy pt has been bed bound for at least the last month and recommends SNF at d/c. Pt has a cortrak tube and cannot be d/c'ed to SNF. Continues to have R sided neglect.  Pt with multiple areas of muscle depletion however noted bed bound status.  Per chart review pt with a 9% weight loss since last documented weight in 2021.    2/13 s/p TNK, diagnostic cerebral angiogram and mechanical thrombectomy  2/15 s/p one-way extubation; cortrak placement tip in the proximal duodenum    Medications reviewed and include: colace, SSI, protonix  NS @ 75 ml/hr  Labs reviewed:  CBG's: 113-152 (NPO)  NUTRITION - FOCUSED PHYSICAL EXAM:  Flowsheet Row Most Recent Value  Orbital Region No depletion  Upper Arm Region No depletion  Thoracic and Lumbar Region No depletion  Buccal Region No depletion  Temple Region Moderate depletion  Clavicle Bone Region Mild depletion  Clavicle and Acromion Bone Region Moderate depletion  Scapular Bone Region Unable to  assess  Dorsal Hand No depletion  Patellar Region Moderate depletion  Anterior Thigh Region Moderate depletion  Posterior Calf Region Moderate depletion  Edema (RD Assessment) Mild  Hair Reviewed  Eyes Reviewed  Mouth Reviewed  Skin Reviewed  Nails Reviewed       Diet Order:   Diet Order             Diet NPO time specified  Diet effective now                   EDUCATION NEEDS:   Not appropriate for education at this time  Skin:  Skin Assessment: Skin Integrity Issues: Skin Integrity Issues:: Stage III Stage III: coccyx, rt upper buttocks (full thickness)  Last BM:  2/14 x 3  Height:   Ht Readings from Last 1 Encounters:  02/16/21 6' (1.829 m)    Weight:   Wt Readings from Last 1 Encounters:  02/15/21 106.8 kg    Ideal Body Weight:  72.7 kg  BMI:  Body mass index is 31.93 kg/m.  Estimated Nutritional Needs:   Kcal:  1900-2100  Protein:  110-125 grams  Fluid:  >1.9 L/day  Lockie Pares., RD, LDN, CNSC See AMiON for contact information

## 2021-02-18 NOTE — Evaluation (Signed)
Speech Language Pathology Evaluation Patient Details Name: Alisha Winters MRN: [id number] DOB: 1932/04/25 Today's Date: 02/18/2021 Time: 4827-0786 SLP Time Calculation (min) (ACUTE ONLY): 11 min  Problem List:  Patient Active Problem List   Diagnosis Date Noted   Stroke (cerebrum) ([place]) 02/16/2021   Coronary artery disease 02/16/2021   Depression 02/16/2021   Diabetes mellitus without complication ([place]) 75/44/9201   Hypertension 02/16/2021   Anemia 02/16/2021   Chronic a-fib ([place]) 02/16/2021   Hyperlipidemia 02/16/2021   Delirium 02/16/2021   Atelectasis 02/16/2021   Obesity (BMI 30.0-34.9) 02/16/2021   Pressure ulcer of coccygeal region, stage 2 ([place]) 02/16/2021   Closed intertrochanteric fracture of hip, right, initial encounter ([place]) 07/15/2019   Past Medical History:  Past Medical History:  Diagnosis Date   Anemia    Cancer ([place])    Coronary artery disease    Depression    Diabetes mellitus without complication ([place])    Encounter for orogastric tube placement    Hypertension    Hypotension due to drugs 02/16/2021   Stroke [facility])    Past Surgical History:  Past Surgical History:  Procedure Laterality Date   CHOLECYSTECTOMY     COLON SURGERY     EYE SURGERY     HERNIA REPAIR     HIP PINNING,CANNULATED Right 07/16/2019   Procedure: CANNULATED HIP PINNING;  Surgeon: Earnestine Leys, MD;  Location: [facility] ORS;  Service: Orthopedics;  Laterality: Right;   IR CT HEAD LTD  02/16/2021   IR PERCUTANEOUS ART THROMBECTOMY/INFUSION INTRACRANIAL INC DIAG ANGIO  02/16/2021   IR US GUIDE VASC ACCESS RIGHT  02/16/2021   RADIOLOGY WITH ANESTHESIA N/A 02/16/2021   Procedure: IR WITH ANESTHESIA;  Surgeon: Radiologist, Medication, MD;  Location: [place];  Service: Radiology;  Laterality: N/A;   HPI:  Pt is an 88 y.o. female who presented to the ED after being found unresponsive with blood glucose of 48. She was responsive and interactive with nursing staff in ED and sudden mental status  change noted with left sided gaze preference, right sided hemiplegia, and aphasia. TNK given at [facility] transferred to [facility] for thrombectomy.  CTA showed distal left M1/prox M2 occlusion. MRI brain 2/14: Acute or subacute left MCA territory infarct involving left frontoparietal cortex and insula. Multiple additional small infarcts in the left occipital lobe and left cerebellum. Pt s/p mechanical thrombectomy 2/13 and was intubated for procedure. ETT 2/13-2/15 (0906). Cortrak placed 2/15. PMH: CVA, DM, HTN, CAD, depression.   Assessment / Plan / Recommendation Clinical Impression  Pt was seen for speech-language evaluation. Pt was lethargic during the evaluation and this likely impacted her performance. Pt presented with severe aphasia characterized by impairments in receptive and expressive language. She did not initiate verbal communication or complete any automatic sequences despite prompts and cues. She completed a single 1-step command with tactile cues, but did not respond to any yes/no questions. Motor speech assessment could not be conducted due to absent verbal out. Skilled SLP services are clinically indicated at this time for further assessment and treatment.    SLP Assessment  SLP Recommendation/Assessment: Patient needs continued Speech [place] [facility] SLP Visit Diagnosis: Aphasia (R47.01)    Recommendations for follow up therapy are one component of a multi-disciplinary discharge planning process, led by the attending physician.  Recommendations may be updated based on patient status, additional functional criteria and insurance authorization.    Follow Up Recommendations  Skilled nursing-short term rehab (<3 hours/day)    Assistance Recommended at Discharge  Frequent or  constant Supervision/Assistance  Functional Status Assessment Patient has had a recent decline in their functional status and demonstrates the ability to make significant improvements in function in a  reasonable and predictable amount of time.  Frequency and Duration min 2x/week  2 weeks      SLP Evaluation Cognition  Overall Cognitive Status: Difficult to assess Arousal/Alertness: Awake/alert       Comprehension  Auditory Comprehension Overall Auditory Comprehension: Impaired Yes/No Questions: Impaired Basic Immediate Environment Questions:  (0/4) Commands: Impaired One Step Basic Commands:  (1/4)    Expression Expression Primary Mode of Expression: Verbal Verbal Expression Overall Verbal Expression: Impaired Initiation: Impaired Automatic Speech: Counting (0/10 despite cues) Level of Generative/Spontaneous Verbalization:  (No verbal output) Repetition: Impaired Level of Impairment: Word level (0/) Naming: Impairment Responsive: Not tested Confrontation:  (/) Convergent:  (0/5) Divergent: Not tested Written Expression Dominant Hand: Right   Oral / Motor  Oral Motor/Sensory Function Overall Oral Motor/Sensory Function: Mild impairment (difficult to thoroughly assess) Facial Symmetry: Abnormal symmetry right;Suspected CN VII (facial) dysfunction Facial Strength: Reduced right;Suspected CN VII (facial) dysfunction Motor Speech Overall Motor Speech:  (Unable to assess)           Fallen Crisostomo I. Hardin Negus, [place], [place] Office number [phone] Pager [place] 02/18/2021, 4:10 PM

## 2021-02-18 NOTE — Evaluation (Signed)
Physical Therapy Evaluation Patient Details Name: Alisha Winters MRN: [id number] DOB: 1932/09/09 Today's Date: 02/18/2021  History of Present Illness  Pt is an 88 yo female who presents to [facility] hospital on 02/16/2021 from SNF where she became suddenly unresponsive. TNK given at [facility] and transferred to [facility] for thrombectomy of L MCA. PMH:  R hip pinning, HTN, depression, CVA, DM, colon cancer, dementia, A fib.  Clinical Impression  Pt presents to PT with deficits in strength, power, communication, functional mobility, balance, attention. Pt with R inattention, demonstrating limited ability to track past midline to R side. PT notes flexor tone in R upper and lower extremity with mobility. Pt requiring 2 person assistance to attempt to stand, however unable to safely extend hips due to feet slipping during first attempt and RLE with flexor synergy pattern during 2nd attempt. Pt will benefit from acute PT services to aide in improving independence in bed mobility and to reduce caregiver burden for out of bed activity.       Recommendations for follow up therapy are one component of a multi-disciplinary discharge planning process, led by the attending physician.  Recommendations may be updated based on patient status, additional functional criteria and insurance authorization.  Follow Up Recommendations Skilled nursing-short term rehab (<3 hours/day)    Assistance Recommended at Discharge Frequent or constant Supervision/Assistance  Patient can return home with the following  Two people to help with walking and/or transfers;Two people to help with bathing/dressing/bathroom;Assistance with cooking/housework;Assistance with feeding;Direct supervision/assist for medications management;Direct supervision/assist for financial management;Assist for transportation;Help with stairs or ramp for entrance    Equipment Recommendations None recommended by PT (pt owns necessary DME)  Recommendations for Other  Services       Functional Status Assessment Patient has had a recent decline in their functional status and demonstrates the ability to make significant improvements in function in a reasonable and predictable amount of time.     Precautions / Restrictions Precautions Precautions: Fall Precaution Comments: R inattention/neglect Restrictions Weight Bearing Restrictions: No      Mobility  Bed Mobility Overal bed mobility: Needs Assistance Bed Mobility: Supine to Sit, Sit to Supine     Supine to sit: Mod assist, +2 for physical assistance, HOB elevated Sit to supine: +2 for physical assistance, Max assist        Transfers Overall transfer level: Needs assistance Equipment used: 2 person hand held assist Transfers: Sit to/from Stand Sit to Stand: Max assist, +2 physical assistance, From elevated surface           General transfer comment: pt is able to clear buttocks from bed, flexor tone in RLE with standing attempts. BUE support and knee blocks provided    Ambulation/Gait                  Stairs            Wheelchair Mobility    Modified Rankin (Stroke Patients Only) Modified Rankin (Stroke Patients Only) Pre-Morbid Rankin Score: Moderately severe disability Modified Rankin: Severe disability     Balance                                             Pertinent Vitals/Pain Pain Assessment Pain Assessment: CPOT Facial Expression: Tense Body Movements: Restlessness Muscle Tension: Relaxed Compliance with ventilator (intubated pts.): N/A Vocalization (extubated pts.): Talking in normal tone or no  sound CPOT Total: 3 Pain Intervention(s): Monitored during session    Home Living Family/patient expects to be discharged to:: Private residence Living Arrangements: Children Available Help at Discharge: Available 24 hours/day Type of Home: Mobile home Home Access: Stairs to enter Entrance Stairs-Rails: Counsellor of Steps: 4   Home Layout: One level Home Equipment: Conservation officer, nature (2 wheels);BSC/3in1;Other (comment);Hospital bed;Wheelchair - manual (hoyer lift)      Prior Function Prior Level of Function : Needs assist       Physical Assist : Mobility (physical);ADLs (physical) Mobility (physical): Bed mobility;Transfers ADLs (physical): Bathing;Grooming;Dressing;Toileting Mobility Comments: prt daughter the pt had been able to ambulate with a RW up until ~2 months ago. Since the pt has been in the bed for most of her time. Pt was being transferred to commode to bath sitting up, unsure of how often this was happening. ADLs Comments: Family has been assisting at bed level and transferring her to [facility]; pt came in with stage 2 pressure area on sacrum     Hand Dominance   Dominant Hand: Right    Extremity/Trunk Assessment   Upper Extremity Assessment Upper Extremity Assessment: Defer to OT evaluation    Lower Extremity Assessment Lower Extremity Assessment: RLE deficits/detail;LLE deficits/detail RLE Deficits / Details: pt with impaired attention to R side, appears to follow commands to raise R leg intermittently with tactile cues. PT notes flexor tone in RLE with attempts at standing LLE Deficits / Details: pt is able to perform SLR in bed with LLE to aide in donning sock and placing pillow to offload heels. at least 3/5 based on LAQ in sitting    Cervical / Trunk Assessment Cervical / Trunk Assessment: Other exceptions;Kyphotic Cervical / Trunk Exceptions: obesity, excess body habitus  Communication   Communication: Expressive difficulties  Cognition Arousal/Alertness: Awake/alert Behavior During Therapy: Impulsive Overall Cognitive Status: Difficult to assess                                 General Comments: pt follows commands with combination of verbal/visual/tactile cues intermittently with increased time        General Comments General comments  (skin integrity, edema, etc.): pt on 3L [place], VSS throughout session    Exercises     Assessment/Plan    PT Assessment Patient needs continued PT services  PT Problem List Decreased strength;Decreased activity tolerance;Decreased balance;Decreased mobility;Decreased cognition;Decreased safety awareness;Decreased knowledge of precautions;Impaired tone       PT Treatment Interventions DME instruction;Functional mobility training;Therapeutic activities;Therapeutic exercise;Balance training;Neuromuscular re-education;Cognitive remediation;Patient/family education;Wheelchair mobility training    PT Goals (Current goals can be found in the Care Plan section)  Acute Rehab PT Goals Patient Stated Goal: to go to rehab PT Goal Formulation: With family Time For Goal Achievement: 03/04/21 Potential to Achieve Goals: Fair    Frequency Min 3X/week     Co-evaluation PT/OT/SLP Co-Evaluation/Treatment: Yes Reason for Co-Treatment: Complexity of the patient's impairments (multi-system involvement);Necessary to address cognition/behavior during functional activity;For patient/therapist safety;To address functional/ADL transfers PT goals addressed during session: Mobility/safety with mobility;Balance;Strengthening/ROM         AM-PAC PT "6 Clicks" Mobility  Outcome Measure Help needed turning from your back to your side while in a flat bed without using bedrails?: Total Help needed moving from lying on your back to sitting on the side of a flat bed without using bedrails?: Total Help needed moving to and from a bed to a chair (  including a wheelchair)?: Total Help needed standing up from a chair using your arms (e.g., wheelchair or bedside chair)?: Total Help needed to walk in hospital room?: Total Help needed climbing 3-5 steps with a railing? : Total 6 Click Score: 6    End of Session Equipment Utilized During Treatment: Oxygen Activity Tolerance: Patient tolerated treatment well Patient  left: in bed;with call bell/phone within reach;with bed alarm set;with restraints reapplied;with family/visitor present Nurse Communication: Mobility status;Need for lift equipment PT Visit Diagnosis: Other abnormalities of gait and mobility (R26.89);Muscle weakness (generalized) (M62.81);Other symptoms and signs involving the nervous system (R29.898)    Time: 2233-6122 PT Time Calculation (min) (ACUTE ONLY): 24 min   Charges:   PT Evaluation $PT Eval Moderate Complexity: [place], PT, DPT Acute Rehabilitation Pager: [phone] Office [phone]   Zenaida Niece 02/18/2021, 2:17 PM

## 2021-02-18 NOTE — Evaluation (Signed)
Occupational Therapy Evaluation Patient Details Name: Alisha Winters MRN: [id number] DOB: 1932/02/18 Today's Date: 02/18/2021   History of Present Illness Pt is an 88 yo female who presents to [facility] hospital on 02/16/2021 from SNF where she became suddenly unresponsive. TNK given at [facility] and transferred to [facility] for thrombectomy of L MCA. PMH:  R hip pinning, HTN, depression, CVA, DM, colon cancer, dementia, A fib.   Clinical Impression   Daughter present for eval and states that her Mom has been in the bed for at least the last month. She would transfer to the [facility] with assistance of her daughter. Per nsg, pt admitted with a sacral pressure area. Nonverbal during session and moves spontaneously to EOB with +2 Mod A , however not following commands due to language and apparent sensory motor deficits. Pt unable to stand upright with +2 Max A although pt put forth effort. Requires overall Max to total A with ADL tasks due to deficits listed below. Recommend rehab at SNF due to functional decline. Acute OT to follow. Daughter agreeable to SNF for rehab and states she realizes her Mom will most likely need LTC after rehab.     Recommendations for follow up therapy are one component of a multi-disciplinary discharge planning process, led by the attending physician.  Recommendations may be updated based on patient status, additional functional criteria and insurance authorization.   Follow Up Recommendations  Skilled nursing-short term rehab (<3 hours/day)    Assistance Recommended at Discharge Frequent or constant Supervision/Assistance  Patient can return home with the following  (total care and hoyer lift)    Functional Status Assessment  Patient has had a recent decline in their functional status and/or demonstrates limited ability to make significant improvements in function in a reasonable and predictable amount of time  Equipment Recommendations  None recommended by OT    Recommendations for  Other Services       Precautions / Restrictions Precautions Precautions: Fall Precaution Comments: R inattention/neglect Restrictions Weight Bearing Restrictions: No      Mobility Bed Mobility Overal bed mobility: Needs Assistance Bed Mobility: Supine to Sit, Sit to Supine     Supine to sit: Mod assist, +2 for physical assistance, HOB elevated Sit to supine: +2 for physical assistance, Max assist        Transfers Overall transfer level: Needs assistance Equipment used: 2 person hand held assist Transfers: Sit to/from Stand Sit to Stand: Max assist, +2 physical assistance, From elevated surface           General transfer comment: pt is able to clear buttocks from bed, flexor tone in RLE with standing attempts. BUE support and knee blocks provided      Balance                                           ADL either performed or assessed with clinical judgement   ADL Overall ADL's : Needs assistance/impaired Eating/Feeding: NPO   Grooming: Maximal assistance   Upper Body Bathing: Maximal assistance   Lower Body Bathing: Total assistance   Upper Body Dressing : Total assistance   Lower Body Dressing: Total assistance       Toileting- Clothing Manipulation and Hygiene: Total assistance Toileting - Clothing Manipulation Details (indicate cue type and reason): incontinenet     Functional mobility during ADLs: Maximal assistance;+2 for safety/equipment (to EOB)  Vision Baseline Vision/History: 1 Wears glasses Vision Assessment?: Vision impaired- to be further tested in functional context Additional Comments: R field cut     Perception Perception Comments: poor spatial awareness; will further assess; R inattention   Praxis      Pertinent Vitals/Pain Pain Assessment Pain Assessment: CPOT Pain Intervention(s): Limited activity within patient's tolerance     Hand Dominance Right   Extremity/Trunk Assessment Upper Extremity  Assessment Upper Extremity Assessment: RUE deficits/detail RUE Deficits / Details: more proximal movement; not moving on command but moving spontaneously; no active hand movement observed; most likely impaired sensation as pt unaware of position of hand RUE Sensation: decreased light touch;decreased proprioception RUE Coordination: decreased fine motor;decreased gross motor   Lower Extremity Assessment Lower Extremity Assessment: Defer to PT evaluation RLE Deficits / Details: pt with impaired attention to R side, appears to follow commands to raise R leg intermittently with tactile cues. PT notes flexor tone in RLE with attempts at standing LLE Deficits / Details: pt is able to perform SLR in bed with LLE to aide in donning sock and placing pillow to offload heels. at least 3/5 based on LAQ in sitting   Cervical / Trunk Assessment Cervical / Trunk Assessment: Other exceptions;Kyphotic Cervical / Trunk Exceptions: obesity, excess body habitus   Communication Communication Communication: Expressive difficulties   Cognition Arousal/Alertness: Awake/alert Behavior During Therapy: Impulsive Overall Cognitive Status: Difficult to assess                                 General Comments: pt follows commands with combination of verbal/visual/tactile cues intermittently with increased time     General Comments  pt on 3L Melissa, VSS throughout session    Exercises Exercises: Other exercises Other Exercises Other Exercises: Educated daughter on [place] to reduce edema and general PROM hand Other Exercises: Educated on sitiing toward her R side to increase attention to R side   Shoulder Instructions      Home Living Family/patient expects to be discharged to:: Private residence Living Arrangements: Children Available Help at Discharge: Available 24 hours/day Type of Home: Mobile home Home Access: Stairs to enter [employer] of Steps: 4 Entrance  Stairs-Rails: Right;Left Home Layout: One level     Bathroom Shower/Tub: Walk-in shower;Tub/shower unit         Home Equipment: Conservation officer, nature (2 wheels);BSC/3in1;Other (comment);Hospital bed;Wheelchair - manual (hoyer lift)          Prior Functioning/Environment Prior Level of Function : Needs assist       Physical Assist : Mobility (physical);ADLs (physical) Mobility (physical): Bed mobility;Transfers ADLs (physical): Bathing;Grooming;Dressing;Toileting Mobility Comments: prt daughter the pt had been able to ambulate with a RW up until ~2 months ago. Since the pt has been in the bed for most of her time. Pt was being transferred to commode to bath sitting up, unsure of how often this was happening. ADLs Comments: Family has been assisting at bed level and transferring her to [facility]; pt came in with stage 2 pressure area on sacrum        OT Problem List: Decreased strength;Decreased range of motion;Decreased activity tolerance;Impaired balance (sitting and/or standing);Impaired vision/perception;Decreased coordination;Decreased cognition;Decreased knowledge of use of DME or AE;Decreased safety awareness;Cardiopulmonary status limiting activity;Impaired sensation;Impaired tone;Obesity;Impaired UE functional use;Increased edema      OT Treatment/Interventions: Self-care/ADL training;Therapeutic exercise;Neuromuscular education;DME and/or AE instruction;Energy conservation;Splinting;Therapeutic activities;Cognitive remediation/compensation;Visual/perceptual remediation/compensation;Patient/family education;Balance training    OT Goals(Current goals  can be found in the care plan section) Acute Rehab OT Goals Patient Stated Goal: per daughter to go to a facility OT Goal Formulation: With family Time For Goal Achievement: 03/04/21 Potential to Achieve Goals: Fair  OT Frequency: Min 2X/week    Co-evaluation PT/OT/SLP Co-Evaluation/Treatment: Yes Reason for Co-Treatment: Complexity of  the patient's impairments (multi-system involvement);For patient/therapist safety;To address functional/ADL transfers PT goals addressed during session: Mobility/safety with mobility;Balance;Strengthening/ROM        AM-PAC OT "6 Clicks" Daily Activity     Outcome Measure Help from another person eating meals?: Total Help from another person taking care of personal grooming?: A Lot Help from another person toileting, which includes using toliet, bedpan, or urinal?: Total Help from another person bathing (including washing, rinsing, drying)?: A Lot Help from another person to put on and taking off regular upper body clothing?: A Lot Help from another person to put on and taking off regular lower body clothing?: Total 6 Click Score: 9   End of Session Equipment Utilized During Treatment: Gait belt Nurse Communication: Mobility status;Other (comment) (pt incontinenet)  Activity Tolerance: Patient tolerated treatment well Patient left: in bed;with call bell/phone within reach;with bed alarm set;with family/visitor present;with restraints reapplied;Other (comment) (modified chair position)  OT Visit Diagnosis: Unsteadiness on feet (R26.81);Other abnormalities of gait and mobility (R26.89);Muscle weakness (generalized) (M62.81);Low vision, both eyes (H54.2);Apraxia (R48.2);Other symptoms and signs involving cognitive function;Other symptoms and signs involving the nervous system (R29.898);Dizziness and giddiness (R42);Hemiplegia and hemiparesis Hemiplegia - Right/Left: Right Hemiplegia - dominant/non-dominant: Dominant Hemiplegia - caused by: Cerebral infarction                Time: 3254-9826 OT Time Calculation (min): 23 min Charges:  OT General Charges $OT Visit: 1 Visit OT Evaluation $OT Eval Moderate Complexity: [place], OT/L   Acute OT Clinical Specialist Acute Rehabilitation Services Pager [phone] Office [phone]   [facility] 02/18/2021, 2:36 PM

## 2021-02-18 NOTE — Progress Notes (Addendum)
STROKE TEAM PROGRESS NOTE   INTERVAL HISTORY Patient is seen in her room with her sister at the bedside.  Just extubated, not following commands but was prior to exubation, blinks to threat on left not right. Dense hemiplegia. SLP eval tomorrow.  MRI scan shows acute left frontoparietal and insular cortex infarct as well as multiple additional small infarcts in left occipital lobe and left cerebellum.  EEG shows focal left hemispheric slowing but no definite seizure activity. Vitals:   02/18/21 0600 02/18/21 0700 02/18/21 0800 02/18/21 0806  BP: (!) 159/68 (!) 164/72 (!) 177/67   Pulse: 66 (!) 58 (!) 54 60  Resp: 17 16 16 16   Temp:      TempSrc:      SpO2: 91% 99% 96% 100%  Height:       CBC:  Recent Labs  Lab 02/16/21 1100 02/16/21 1523 02/18/21 0720  WBC 6.9  --  9.3  NEUTROABS 4.6  --   --   HGB 12.9 12.9 12.2  HCT 41.1 38.0 37.5  MCV 86.2  --  83.9  PLT 270  --  332    Basic Metabolic Panel:  Recent Labs  Lab 02/16/21 1527 02/17/21 0954 02/18/21 0720  NA 137 136 136  K 3.7 4.0 4.0  CL 105 108 106  CO2 24 20* 21*  GLUCOSE 221* 156* 130*  BUN 10 17 19   CREATININE 0.68 1.07* 0.82  CALCIUM 7.8* 7.6* 7.7*  MG 1.9  --   --     Lipid Panel:  Recent Labs  Lab 02/17/21 0529  CHOL 84  TRIG 97  HDL 24*  CHOLHDL 3.5  VLDL 19  LDLCALC 41    HgbA1c:  Recent Labs  Lab 02/16/21 1520  HGBA1C 6.2*    Urine Drug Screen:  Recent Labs  Lab 02/16/21 1151  LABOPIA NONE DETECTED  COCAINSCRNUR NONE DETECTED  LABBENZ NONE DETECTED  AMPHETMU NONE DETECTED  THCU NONE DETECTED  LABBARB NONE DETECTED     Alcohol Level No results for input(s): ETH in the last 168 hours.  IMAGING past 24 hours MR BRAIN WO CONTRAST  Result Date: 02/17/2021 CLINICAL DATA:  Neuro deficit, acute, stroke suspected EXAM: MRI HEAD WITHOUT CONTRAST TECHNIQUE: Multiplanar, multiecho pulse sequences of the brain and surrounding structures were obtained without intravenous contrast.  COMPARISON:  CT head February 16, 2021. FINDINGS: Brain: Acute or subacute left MCA territory infarct involving left frontal and parietal cortex and insula. Multiple additional small infarcts in the left occipital lobe and left cerebellum. No significant mass effect. No hydrocephalus, mass lesion, midline shift, or extra-axial fluid collection. Cerebral atrophy. Vascular: Major arterial flow voids are maintained at the skull base. Skull and upper cervical spine: Normal marrow signal. Sinuses/Orbits: Mild-to-moderate paranasal sinus mucosal thickening with small air-fluid level in left sphenoid sinus. Unremarkable orbits. Other: Small bilateral mastoid effusions. IMPRESSION: Acute or subacute left MCA territory infarct involving left frontoparietal cortex and insula. Multiple additional small infarcts in the left occipital lobe and left cerebellum. Given involvement of multiple vascular territories, consider an embolic etiology. No mass effect. Electronically Signed   By: Margaretha Sheffield M.D.   On: 02/17/2021 11:31   ECHOCARDIOGRAM COMPLETE  Result Date: 02/17/2021    ECHOCARDIOGRAM REPORT   Patient Name:   Alisha Winters Date of Exam: 02/17/2021 Medical Rec #:  [id number]        Height:       72.0 in Accession #:    [id number]  Weight:       235.4 lb Date of Birth:  1932/09/07        BSA:          2.284 m Patient Age:    88 years         BP:           123/57 mmHg Patient Gender: F                HR:           62 bpm. Exam Location:  Inpatient Procedure: 2D Echo, Cardiac Doppler and Color Doppler Indications:    I63.9 STROKE  History:        Patient has prior history of Echocardiogram examinations, most                 recent 07/15/2019. Stroke; Risk Factors:Hypertension, Diabetes                 and Family History of Coronary Artery Disease. CA / CAD.  Sonographer:    Beryle Beams Referring Phys: [id number] [place]  Sonographer Comments: No subcostal window, patient is morbidly obese and echo  performed with patient supine and on artificial respirator. IMPRESSIONS  1. Left ventricular ejection fraction, by estimation, is 60 to 65%. The left ventricle has normal function. Left ventricular endocardial border not optimally defined to evaluate regional wall motion. Left ventricular diastolic function could not be evaluated.  2. Right ventricular systolic function is normal. The right ventricular size is normal. Tricuspid regurgitation signal is inadequate for assessing PA pressure.  3. The mitral valve is grossly normal. No evidence of mitral valve regurgitation. No evidence of mitral stenosis.  4. The aortic valve is grossly normal. Aortic valve regurgitation is not visualized. No aortic stenosis is present. Conclusion(s)/Recommendation(s): No intracardiac source of embolism detected on this transthoracic study. Consider a transesophageal echocardiogram to exclude cardiac source of embolism if clinically indicated. FINDINGS  Left Ventricle: Left ventricular ejection fraction, by estimation, is 60 to 65%. The left ventricle has normal function. Left ventricular endocardial border not optimally defined to evaluate regional wall motion. The left ventricular internal cavity size was normal in size. There is no left ventricular hypertrophy. Left ventricular diastolic function could not be evaluated due to nondiagnostic images. Left ventricular diastolic function could not be evaluated. Right Ventricle: No subviews. The right ventricular size is normal. No increase in right ventricular wall thickness. Right ventricular systolic function is normal. Tricuspid regurgitation signal is inadequate for assessing PA pressure. Left Atrium: Left atrial size was normal in size. Right Atrium: Right atrial size was normal in size. Pericardium: Trivial pericardial effusion is present. Mitral Valve: The mitral valve is grossly normal. No evidence of mitral valve regurgitation. No evidence of mitral valve stenosis. Tricuspid  Valve: The tricuspid valve is grossly normal. Tricuspid valve regurgitation is not demonstrated. No evidence of tricuspid stenosis. Aortic Valve: The aortic valve is grossly normal. Aortic valve regurgitation is not visualized. No aortic stenosis is present. Aortic valve mean gradient measures 3.0 mmHg. Aortic valve peak gradient measures 6.2 mmHg. Aortic valve area, by VTI measures 2.44 cm. Pulmonic Valve: The pulmonic valve was grossly normal. Pulmonic valve regurgitation is not visualized. No evidence of pulmonic stenosis. Aorta: The aortic root and ascending aorta are structurally normal, with no evidence of dilitation. Venous: The inferior vena cava was not well visualized. IAS/Shunts: The atrial septum is grossly normal.  LEFT VENTRICLE PLAX 2D LVIDd:  4.00 cm     Diastology LVIDs:         2.50 cm     LV e' medial:  4.97 cm/s LV PW:         1.10 cm     LV e' lateral: 7.96 cm/s LV IVS:        0.80 cm LVOT diam:     2.30 cm LV SV:         50 LV SV Index:   22 LVOT Area:     4.15 cm  LV Volumes (MOD) LV vol d, MOD A2C: 57.5 ml LV vol d, MOD A4C: 74.1 ml LV vol s, MOD A2C: 30.4 ml LV vol s, MOD A4C: 26.4 ml LV SV MOD A2C:     27.1 ml LV SV MOD A4C:     74.1 ml LV SV MOD BP:      37.2 ml RIGHT VENTRICLE RV S prime:     18.40 cm/s TAPSE (M-mode): 1.2 cm LEFT ATRIUM             Index        RIGHT ATRIUM           Index LA diam:        3.70 cm 1.62 cm/m   RA Area:     12.90 cm LA Vol (A2C):   56.7 ml 24.83 ml/m  RA Volume:   29.60 ml  12.96 ml/m LA Vol (A4C):   46.4 ml 20.32 ml/m LA Biplane Vol: 53.6 ml 23.47 ml/m  AORTIC VALVE                    PULMONIC VALVE AV Area (Vmax):    2.61 cm     PV Vmax:       0.83 m/s AV Area (Vmean):   2.48 cm     PV Vmean:      48.600 cm/s AV Area (VTI):     2.44 cm     PV VTI:        0.116 m AV Vmax:           125.00 cm/s  PV Peak grad:  2.8 mmHg AV Vmean:          85.000 cm/s  PV Mean grad:  1.0 mmHg AV VTI:            0.204 m AV Peak Grad:      6.2 mmHg AV Mean  Grad:      3.0 mmHg LVOT Vmax:         78.60 cm/s LVOT Vmean:        50.800 cm/s LVOT VTI:          0.120 m LVOT/AV VTI ratio: 0.59  AORTA Ao Root diam: 2.90 cm Ao Asc diam:  2.70 cm  SHUNTS Systemic VTI:  0.12 m Systemic Diam: 2.30 cm Eleonore Chiquito MD Electronically signed by Eleonore Chiquito MD Signature Date/Time: 02/17/2021/2:25:21 PM    Final     PHYSICAL EXAM General:  Obese Caucasian female in no acute distress  Neurological:  Patient will open eyes and tracks examiner to midline, but will turn head to the right.  Does not follow commands, globally aphasic.  Left gaze deviation, patient blinks to threat on left side only.   Moves upper extremities antigravity, generalized weakness LUE 3/5  RUE 2/5 LLE 1/5 RLE 1/5 Sensation grossly intact  ASSESSMENT/PLAN Alisha Winters is a 88 y.o. female with history of dementia,  atrial fibrillation, prior stroke, colon cancer and T2DM presenting with right arm and leg weakness and left gaze deviation.  2/13- Patient was given TNK and transferred here for thrombectomy.  Thrombectomy was successful with TICI3 recanalization on the left M1 MCA.  Patient remained intubated after the procedure.  Upon discussion with patient's sister, code status was changed to DNR and decision was made not to reintubate after extubation. Extubated 2/15.  Stroke:  left MCA infarct  likely secondary due to left middle cerebral artery occlusion etiology likely A-fib not on anticoagulation  code Stroke CT head No acute abnormality. Chronic left cerebellar infarct CTA head & neck LVO in distal left M1 segment, 40% narrowing of left ICA Post IR CT No acute abnormality, old cerebellar infarct MRI  left MCA territory infarct involving left frontoparietal cortex and insula, additional small infarcts in left occipital lobe and left cerebellum 2D Echo EF 60-65%, normal atrial septum, no intracardiac source of embolism LDL 41 HgbA1c 6.2 EEG focal left hemispheric slowing.  No seizure  activity VTE prophylaxis - lovenox    Diet   Diet NPO time specified   aspirin 81 mg daily prior to admission, now on aspirin 81 mg daily.  Therapy recommendations:  pending Disposition:  pending  Hypertension Home meds:  lisinopril 10 mg daily Stable Keep SBP 120-140 Cleviprex as needed Long-term BP goal normotensive  Hyperlipidemia Home meds:  rosuvastatin 10 mg daily, resumed in hospital LDL 41, goal < 70 Continue statin at discharge  Diabetes type II Controlled Home meds:  insulin 70/30 unknown dose with meals HgbA1c 6.2, goal < 7.0 CBGs Recent Labs    02/17/21 2337 02/18/21 0406 02/18/21 0721  GLUCAP 123* 124* 152*     SSI  Other Stroke Risk Factors Advanced Age >/= 65  Obesity, Body mass index is 31.93 kg/m., BMI >/= 30 associated with increased stroke risk, recommend weight loss, diet and exercise as appropriate  Hx stroke  Other Active Problems Acute respiratory failure Patient was intubated for thrombectomy Extubated 2/15- now DNR/DNI Management per [facility] day # 2  Patient seen and examined by NP/APP with MD. MD to update note as needed.   Janine Ores, DNP, FNP-BC [facility] Pager: [phone]  I have personally obtained history,examined this patient, reviewed notes, independently viewed imaging studies, participated in medical decision making and plan of care.ROS completed by me personally and pertinent positives fully documented  I have made any additions or clarifications directly to the above note. Agree with note above.  Patient remains aphasic with right hemiparesis.  Continue mobilization out of bed.  Physical occupational and speech therapy consults.  Check swallow eval and if does not pass may need feeding tube. She may need to be started on anticoagulation in 3 to 5 days post stroke.  Long discussion with patient and daughter at the bedside and answered questions.  Discussed with Dr. Shearon Stalls..This patient is critically  ill and at significant risk of neurological worsening, death and care requires constant monitoring of vital signs, hemodynamics,respiratory and cardiac monitoring, extensive review of multiple databases, frequent neurological assessment, discussion with family, other specialists and medical decision making of high complexity.I have made any additions or clarifications directly to the above note.This critical care time does not reflect procedure time, or teaching time or supervisory time of PA/NP/Med Resident etc but could involve care discussion time.  I spent 30 minutes of neurocritical care time  in the care of  this patient.  Antony Contras, MD Medical Director [facility] Pager: [phone] 02/18/2021 4:07 PM  To contact Stroke Continuity provider, please refer to [email or web address] After hours, contact General Neurology

## 2021-02-18 NOTE — Progress Notes (Addendum)
NAME:  Alisha Winters, MRN:  [id number], DOB:  02-12-1932, LOS: 2 ADMISSION DATE:  02/16/2021, CONSULTATION DATE:  2/13 REFERRING MD:  Reeves Forth, CHIEF COMPLAINT:  post-procedural critical care    History of Present Illness:  This is a 88 year old female w/ an extensive medical hx as per below. Initially presented to ER 2/12 for evaluation of decreased LOC in context of blood glucose of 48 (improved w/ Dextrose administration). Was being held for social work eval given concern about her ability to self administer her insulin and check blood glucose. Nursing notes capture several incidences of confusion requiring re-orientation. On 2/13 while in ER (at [facility]) family called out to nurse sudden mental status change consisting of: left sided gaze pref and right sided hemiplegia. Code stroke was called. NIHSS score 24  CTA completed: showed distal left M1/prox M2 occlusion, tNK IV administered at 11am.  Intubated for procedure. Had some post-porcedural transient hypotension. Transferred to [facility] for Neuro-IR evaluation.. Critical care called S/p mechanical thrombectomy 2/13  to assist w/ post-procedural ICU care.   Pertinent  Medical History  CAD Two-vessel disease with normal LVEF by cardiac cath 7/12.  Colon cancer s/p resection and chemo 1997.  IDDM Chronic anemia  Chronic afib (coumadin and metoprolol)  Hyperlipidemia  Depression  HTN (on Norvasc, lasix, Imdur, Lisinopril, metoprolol)  Stroke 2003 Bilateral cataract surg  Note extensive allergy list Significant Hospital Events: Including procedures, antibiotic start and stop dates in addition to other pertinent events   2/12 initially to ER w/ AMS 2/2 hypoglycemia. Was awaiting social work consult. Had multiple witnessed episodes of confusion requiring re-orientation  2/13 Code stroke called for sudden left gaze preference and right sided hemiparesis. NIHSS score 24. CT angio: 1. Positive for emergent large vessel occlusion at the distal left M1  segment just beyond the anterior temporal branch. 2. Calcified plaque causes 40% narrowing at the left ICA origin. 3. Advanced narrowing of the non dominant left V4 segment. Small basilar in the setting of fetal type bilateral PCA flow. tNK administered at 11am. Intubated for planned neuro-intervention. Had some brief post intubation hypotension. Transported to [facility] for IR. Admitted to Neuro ICU s/p mechanical thrombectomy of M1/MCA. Returned on vent. F/u CT brain: 1. No acute intracranial abnormality.2. 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  2/14 more awake. Not following commands. Seems to have right sided neglect. Weaning on vent. IR changing BP goals to < 180. Changed Hydralazine to PRN. Added back isosorbide.    Interim History / Subjective:  This morning awake, alert, not following commands  Objective   Blood pressure (!) 177/67, pulse 60, temperature 97.9 F (36.6 C), temperature source Axillary, resp. rate 16, height 6' (1.829 m), SpO2 100 %.    Vent Mode: PSV;CPAP FiO2 (%):  [30 %] 30 % Set Rate:  [16 bmp] 16 bmp Vt Set:  [580 mL] 580 mL PEEP:  [5 cmH20] 5 cmH20 Pressure Support:  [5 cmH20-10 cmH20] 5 cmH20 Plateau Pressure:  [15 cmH20-19 cmH20] 16 cmH20   Intake/Output Summary (Last 24 hours) at 02/18/2021 0906 Last data filed at 02/18/2021 0800 Gross per 24 hour  Intake 1636.33 ml  Output 384 ml  Net 1252.33 ml   There were no vitals filed for this visit.  Examination: Gen: intubated, not sedated, awake HEENT: ETT to vent Neuro: looks and tracks, does not speak or follow commands CV: RRR Resp: few rhonchi, weak cough, no crackles or increased wob Ext: mild edema bilaterally  Resolved Hospital Problem list     Assessment & Plan:  Principal Problem:   Stroke (cerebrum) ([place]) Active Problems:   Coronary artery disease   Depression   Diabetes mellitus without complication (HCC)   Hypertension   Anemia   Chronic a-fib (HCC)    Hyperlipidemia   Delirium   Atelectasis   Obesity (BMI 30.0-34.9)   Pressure ulcer of coccygeal region, stage 2 ([facility])  Acute Left MCA stroke s/p TPA (11 am 2/13) and successful mechanical thrombectomy 1/74, complicated by delirium and h/o depression  -risk factors: prior stroke, HTN, afib, HL, subtherapeutic INR despite being on coumadin.  Serial neuro checks SBP goal <180 Secondary stroke prevention per Neuro  MRI - shows left MCA acute infarct with multiple small infarcts concerning for embolic phenomenon. Would discuss with neurology if DOAC should be started.  Pt/OT SLP once extubated  HTN  Off cleviprex.  On home imdur,  prn hydralazine  H/o two vessel CAD Chronic AF c/b bradycardia.  Witnessed run of VT  Plan Holding BB Mg goal > 2/K goal > 4 Tele  Consider DOAC initiation instead of coumadin.   Post procedural ventilator management.   Atelectasis  Intubated for procedure. Ability to protect airway not clear given pre-intubation documentation  Certainly risk for aspiration; PCT neg.  Plan Discussed with family at bedside. Will plan for one-way extubation with no plan to reintubate. If she does not do well would discuss with family comfort measures if not doing well post-extubation.   Fluid and electrolyte imbalance Plan BMET this morning shows Na 1136, K 4.0, Cr 0.82 Repeat in Am.   Decreased UOP Plan Bladder scan and chemistry  May need some gentle hydration   H/o hyperlipidemia  Plan Crestor  DM w/ hyperglycemia Plan SSI  Goal 140-180  Obesity  Plan RD consulted   Sacral pressure ulcer Plan WOC consulted  Best Practice (right click and "Reselect all SmartList Selections" daily)   Diet/type: NPO,plan for cortrak placement for feeds. SLP eval DVT prophylaxis: SCD GI prophylaxis: PPI Lines: N/A Foley:  N/A Code Status:  full code Last date of multidisciplinary goals of care discussion [discussed today with family at bedside  2/15]  PCCM  will see as needed at this point. Please call us if we can be of further assistance.    Critical care time: 31 minutes    The patient is critically ill due to respiratory failure encephalopathy from stroke, respiratory failure and vent management.  Critical care was necessary to treat or prevent imminent or life-threatening deterioration.  Critical care was time spent personally by me on the following activities: development of treatment plan with patient and/or surrogate as well as nursing, discussions with consultants, evaluation of patient's response to treatment, examination of patient, obtaining history from patient or surrogate, ordering and performing treatments and interventions, ordering and review of laboratory studies, ordering and review of radiographic studies, pulse oximetry, re-evaluation of patient's condition and participation in multidisciplinary rounds.   Critical Care Time devoted to patient care services described in this note is 31 minutes. This time reflects time of care of this [place] . This critical care time does not reflect separately billable procedures or procedure time, teaching time or supervisory time of PA/NP/Med student/Med Resident etc but could involve care discussion time.       Leone Haven [facility] 02/18/2021 9:07 AM  Pager: see AMION  If no response to pager , please call critical  care on call (see AMION) until 7pm After 7:00 pm call [place]

## 2021-02-18 NOTE — Procedures (Signed)
Extubation Procedure Note  Patient Details:   Name: Alisha Winters DOB: 1932-11-07 MRN: [id number]   Airway Documentation:    Vent end date: 02/18/21 Vent end time: 0906   Evaluation  O2 sats: stable throughout Complications: No apparent complications Patient did tolerate procedure well. Bilateral Breath Sounds: Clear, Diminished   Yes  Pt extubated to 3L [address] [place]. Cuff leak present, No stridor noted, pt tolerated well. RN at bedside, RT will continue to monitor.   Geni Bers Clydean Posas 02/18/2021, 9:06 AM

## 2021-02-18 NOTE — TOC CAGE-AID Note (Signed)
Transition of Care [facility]) - CAGE-AID Screening   Patient Details  Name: Alisha Winters MRN: [id number] Date of Birth: [birth year]  Transition of Care [facility]) CM/SW Contact:    Alvey Brockel C Tarpley-Carter, [place] Phone Number: 02/18/2021, 11:39 AM   Clinical Narrative: Pt is unable to participate in Cage Aid. Pt is critically ill with feeding tube.  Geri Hepler Tarpley-Carter, MSW, LCSW-A Pronouns:  She/Her/[place] Transitions of Care Clinical Social Worker Direct Number:  [phone] [email or web address]  CAGE-AID Screening: Substance Abuse Screening unable to be completed due to: : Patient unable to participate             Substance Abuse Education Offered: No

## 2021-02-19 DIAGNOSIS — I63512 Cerebral infarction due to unspecified occlusion or stenosis of left middle cerebral artery: Secondary | ICD-10-CM | POA: Diagnosis not present

## 2021-02-19 LAB — PHOSPHORUS
Phosphorus: 2.9 mg/dL (ref 2.5–4.6)
Phosphorus: 2.2 mg/dL — ABNORMAL LOW (ref 2.5–4.6)

## 2021-02-19 LAB — CULTURE, RESPIRATORY W GRAM STAIN
Culture: NO GROWTH
Gram Stain: NONE SEEN

## 2021-02-19 LAB — GLUCOSE, CAPILLARY
Glucose-Capillary: 261 mg/dL — ABNORMAL HIGH (ref 70–99)
Glucose-Capillary: 293 mg/dL — ABNORMAL HIGH (ref 70–99)
Glucose-Capillary: 221 mg/dL — ABNORMAL HIGH (ref 70–99)
Glucose-Capillary: 261 mg/dL — ABNORMAL HIGH (ref 70–99)
Glucose-Capillary: 201 mg/dL — ABNORMAL HIGH (ref 70–99)

## 2021-02-19 LAB — BASIC METABOLIC PANEL
Anion gap: 12 (ref 5–15)
BUN: 24 mg/dL — ABNORMAL HIGH (ref 8–23)
CO2: 23 mmol/L (ref 22–32)
Calcium: 7.9 mg/dL — ABNORMAL LOW (ref 8.9–10.3)
Chloride: 103 mmol/L (ref 98–111)
Creatinine, Ser: 0.95 mg/dL (ref 0.44–1.00)
GFR, Estimated: 58 mL/min — ABNORMAL LOW (ref >60)
Glucose, Bld: 278 mg/dL — ABNORMAL HIGH (ref 70–99)
Potassium: 4 mmol/L (ref 3.5–5.1)
Sodium: 138 mmol/L (ref 135–145)

## 2021-02-19 LAB — MAGNESIUM
Magnesium: 2.2 mg/dL (ref 1.7–2.4)
Magnesium: 2.1 mg/dL (ref 1.7–2.4)

## 2021-02-19 MED ORDER — PANTOPRAZOLE 2 MG/ML SUSPENSION
40.0000 mg | Freq: Every day | ORAL | Status: DC
  Administered 2021-02-20: 40 mg
  Filled 2021-02-19: qty 20

## 2021-02-19 MED ORDER — MELATONIN 3 MG PO TABS
3.0000 mg | ORAL_TABLET | Freq: Every day | ORAL | Status: DC
  Filled 2021-02-19: qty 1

## 2021-02-19 MED ORDER — MELATONIN 3 MG PO TABS
3.0000 mg | ORAL_TABLET | Freq: Every day | ORAL | Status: DC
  Administered 2021-02-19: 3 mg

## 2021-02-19 MED ORDER — INSULIN ASPART 100 UNIT/ML IJ SOLN
0.0000 [IU] | INTRAMUSCULAR | Status: DC
  Administered 2021-02-19: 8 [IU] via SUBCUTANEOUS
  Administered 2021-02-19 – 2021-02-20 (×6): 5 [IU] via SUBCUTANEOUS

## 2021-02-19 MED ORDER — QUETIAPINE FUMARATE 25 MG PO TABS
25.0000 mg | ORAL_TABLET | Freq: Every day | ORAL | Status: DC
  Administered 2021-02-19: 25 mg

## 2021-02-19 MED ORDER — INSULIN DETEMIR 100 UNIT/ML ~~LOC~~ SOLN
5.0000 [IU] | Freq: Every day | SUBCUTANEOUS | Status: DC
  Administered 2021-02-19: 5 [IU] via SUBCUTANEOUS
  Filled 2021-02-19 (×2): qty 0.05

## 2021-02-19 MED ORDER — QUETIAPINE FUMARATE 25 MG PO TABS
25.0000 mg | ORAL_TABLET | Freq: Every day | ORAL | Status: DC
  Filled 2021-02-19: qty 1

## 2021-02-19 NOTE — Progress Notes (Addendum)
STROKE TEAM PROGRESS NOTE   INTERVAL HISTORY Patient is seen in her room, no family at the bedside.  RN states patient was awake most of the night.  She is still not following commands, Dense hemiplegia.  Coretrak placed yesterday  Neurological exam is unchanged.  Vital signs are stable. Vitals:   02/19/21 1100 02/19/21 1200 02/19/21 1300 02/19/21 1400  BP: (!) 161/73 133/67 (!) 145/61 (!) 123/56  Pulse: 72 (!) 58 (!) 59 61  Resp: 19 16 15 16   Temp:  97.7 F (36.5 C)    TempSrc:  Axillary    SpO2: 96% 97% 97% 92%  Height:       CBC:  Recent Labs  Lab 02/16/21 1100 02/16/21 1523 02/18/21 0720  WBC 6.9  --  9.3  NEUTROABS 4.6  --   --   HGB 12.9 12.9 12.2  HCT 41.1 38.0 37.5  MCV 86.2  --  83.9  PLT 270  --  570    Basic Metabolic Panel:  Recent Labs  Lab 02/18/21 0720 02/18/21 1506 02/19/21 0426  NA 136  --  138  K 4.0  --  4.0  CL 106  --  103  CO2 21*  --  23  GLUCOSE 130*  --  278*  BUN 19  --  24*  CREATININE 0.82  --  0.95  CALCIUM 7.7*  --  7.9*  MG  --  2.2 2.2  PHOS  --  3.8 2.9    Lipid Panel:  Recent Labs  Lab 02/17/21 0529  CHOL 84  TRIG 97  HDL 24*  CHOLHDL 3.5  VLDL 19  LDLCALC 41    HgbA1c:  Recent Labs  Lab 02/16/21 1520  HGBA1C 6.2*    Urine Drug Screen:  Recent Labs  Lab 02/16/21 1151  LABOPIA NONE DETECTED  COCAINSCRNUR NONE DETECTED  LABBENZ NONE DETECTED  AMPHETMU NONE DETECTED  THCU NONE DETECTED  LABBARB NONE DETECTED     Alcohol Level No results for input(s): ETH in the last 168 hours.  IMAGING past 24 hours No results found.  PHYSICAL EXAM General:  Obese Caucasian female in no acute distress  Neurological:  Patient will open eyes and tracks examiner to midline, but will turn head to the right.  Does not follow commands, globally aphasic.  Left gaze deviation, patient blinks to threat on left side only.   Moves upper extremities antigravity, generalized weakness LUE 4/5  RUE 1/5 LLE 3/5 RLE  2/5 Sensation grossly intact  ASSESSMENT/PLAN [name] is a 88 y.o. female with history of dementia, atrial fibrillation, prior stroke, colon cancer and T2DM presenting with right arm and leg weakness and left gaze deviation.  2/13- Patient was given TNK and transferred here for thrombectomy.  Thrombectomy was successful with TICI3 recanalization on the left M1 MCA.  Patient remained intubated after the procedure.  Upon discussion with patient's sister, code status was changed to DNR and decision was made not to reintubate after extubation. Extubated 2/15.  Cortrak placed yesterday and tube feeds initiated.   Stroke:  left MCA infarct  likely secondary due to left middle cerebral artery occlusion etiology likely A-fib not on anticoagulation  code Stroke CT head No acute abnormality. Chronic left cerebellar infarct CTA head & neck LVO in distal left M1 segment, 40% narrowing of left ICA Post IR CT No acute abnormality, old cerebellar infarct MRI  left MCA territory infarct involving left frontoparietal cortex and insula, additional small infarcts in  left occipital lobe and left cerebellum 2D Echo EF 60-65%, normal atrial septum, no intracardiac source of embolism LDL 41 HgbA1c 6.2 EEG focal left hemispheric slowing.  No seizure activity VTE prophylaxis - lovenox aspirin 81 mg daily prior to admission, now on aspirin 81 mg daily.  Therapy recommendations :SNF  Disposition:  pending  Hypertension Home meds:  lisinopril 10 mg daily Stable Keep SBP 120-140 Cleviprex as needed Long-term BP goal normotensive  Hyperlipidemia Home meds:  rosuvastatin 10 mg daily, resumed in hospital LDL 41, goal < 70 Continue statin at discharge  Diabetes type II Controlled Home meds:  insulin 70/30 unknown dose with meals HgbA1c 6.2, goal < 7.0 CBGs SSI and 5u levimir daily  Other Stroke Risk Factors Advanced Age >/= 75  Obesity, Body mass index is 31.93 kg/m., BMI >/= 30 associated with  increased stroke risk, recommend weight loss, diet and exercise as appropriate  Hx stroke  Other Active Problems Acute respiratory failure Patient was intubated for thrombectomy Extubated 2/15- now DNR/DNI Management per [facility] day # 3  Patient seen and examined by NP/APP with MD. MD to update note as needed.   Janine Ores, DNP, FNP-BC [facility] Pager: [phone]  I have personally obtained history,examined this patient, reviewed notes, independently viewed imaging studies, participated in medical decision making and plan of care.ROS completed by me personally and pertinent positives fully documented  I have made any additions or clarifications directly to the above note. Agree with note above.  Continue ongoing therapies.  Mobilize out of bed.  Transfer to neurology floor bed.  Greater than 50% time during this 35-minute visit was spent on counseling and coordination of care about her stroke and aphasia and hemiplegia and answering questions  Antony Contras, MD Medical Director [place] Pager: [phone] 02/19/2021 4:33 PM   To contact Stroke Continuity provider, please refer to [email or web address] After hours, contact General Neurology

## 2021-02-19 NOTE — Progress Notes (Signed)
Speech Language Pathology Treatment: Dysphagia  Patient Details Name: Alisha Winters MRN: [id number] DOB: [birth year] Today's Date: 02/19/2021 Time: 1202-1212 SLP Time Calculation (min) (ACUTE ONLY): 10 min  Assessment / Plan / Recommendation Clinical Impression  Pt was seen for dysphagia treatment. Pt's nurse, Marlowe Kays, reported that the pt was more alert prior to SLP's arrival for ~2 hours. Pt was lethargic during the session, but adequately alert for p.o. intake. Despite verbal prompts, tactile cues, and adequate alertness at time of presentation, bolus manipulation of ice chips and purees was absent, and no volitional swallow was noted. Pt still does not present as a candidate for safe oral intake. SLP will continue to follow pt.     HPI HPI: Pt is an 88 y.o. female who presented to the ED after being found unresponsive with blood glucose of 48. She was responsive and interactive with nursing staff in ED and sudden mental status change noted with left sided gaze preference, right sided hemiplegia, and aphasia. TNK given at [facility] transferred to [facility] for thrombectomy.  CTA showed distal left M1/prox M2 occlusion. MRI brain 2/14: Acute or subacute left MCA territory infarct involving left frontoparietal cortex and insula. Multiple additional small infarcts in the left occipital lobe and left cerebellum. Pt s/p mechanical thrombectomy 2/13 and was intubated for procedure. ETT 2/13-2/15 (0906). Cortrak placed 2/15. PMH: CVA, DM, HTN, CAD, depression.      SLP Plan  Continue with current plan of care      Recommendations for follow up therapy are one component of a multi-disciplinary discharge planning process, led by the attending physician.  Recommendations may be updated based on patient status, additional functional criteria and insurance authorization.    Recommendations  Diet recommendations: NPO Medication Administration: Via alternative means                Oral Care  Recommendations: Oral care QID Follow Up Recommendations: Skilled nursing-short term rehab (<3 hours/day) Assistance recommended at discharge: Frequent or constant Supervision/Assistance SLP Visit Diagnosis: Aphasia (R47.01) Plan: Continue with current plan of care        Alayzia Pavlock I. Hardin Negus, [place], [place] Office number [phone] Pager [place]  02/19/2021, 12:24 PM

## 2021-02-19 NOTE — Progress Notes (Signed)
Brief Neuro Update:  Was given Fentanyl x 2 for agitation on the floor. I discontinue fentanyl PRN order, this was probably leftover order from the ICU. I do not think that Fentanyl is appropriate for agitation at her age. Will do Seroquel 25mg  PO x 1 and melatonin. If she does not calm down, will escalate from there.  [place] Pager Number [phone]

## 2021-02-19 NOTE — Progress Notes (Signed)
Inpatient Diabetes Program Recommendations  AACE/ADA: New Consensus Statement on Inpatient Glycemic Control  Target Ranges:  Prepandial:   less than 140 mg/dL      Peak postprandial:   less than 180 mg/dL (1-2 hours)      Critically ill patients:  140 - 180 mg/dL    Latest Reference Range & Units 02/18/21 11:19 02/18/21 15:20 02/18/21 20:21 02/18/21 23:55 02/19/21 03:26  Glucose-Capillary 70 - 99 mg/dL 126 (H) 123 (H) 199 (H) 231 (H) 261 (H)   Review of Glycemic Control  Diabetes history: DM2 Outpatient Diabetes medications: 70/30 BID (per office note on 05/16/20 was prescribed 70/30 48 units in AM, 70/30  20 units QPM) Current orders for Inpatient glycemic control: Novolog 0-15 units Q4H  Inpatient Diabetes Program Recommendations:    Insulin: Please consider ordering Levemir 5 units Q24H and ordering Novolog 4 units Q4H for tube feeding coverage. If tube feeding is stopped or held then Novolog tube feeding coverage should also be stopped or held.  Thanks, Barnie Alderman, RN, MSN, CDE Diabetes Coordinator Inpatient Diabetes Program [phone] (Team Pager from 8am to 5pm)

## 2021-02-20 DIAGNOSIS — I63512 Cerebral infarction due to unspecified occlusion or stenosis of left middle cerebral artery: Secondary | ICD-10-CM | POA: Diagnosis not present

## 2021-02-20 DIAGNOSIS — I482 Chronic atrial fibrillation, unspecified: Secondary | ICD-10-CM | POA: Diagnosis not present

## 2021-02-20 LAB — BASIC METABOLIC PANEL
Anion gap: 5 (ref 5–15)
BUN: 40 mg/dL — ABNORMAL HIGH (ref 8–23)
CO2: 26 mmol/L (ref 22–32)
Calcium: 7.8 mg/dL — ABNORMAL LOW (ref 8.9–10.3)
Chloride: 108 mmol/L (ref 98–111)
Creatinine, Ser: 0.71 mg/dL (ref 0.44–1.00)
GFR, Estimated: >60 mL/min (ref >60)
Glucose, Bld: 256 mg/dL — ABNORMAL HIGH (ref 70–99)
Potassium: 3.7 mmol/L (ref 3.5–5.1)
Sodium: 139 mmol/L (ref 135–145)

## 2021-02-20 LAB — MAGNESIUM: Magnesium: 2.1 mg/dL (ref 1.7–2.4)

## 2021-02-20 LAB — CBC
HCT: 34.7 % — ABNORMAL LOW (ref 36.0–46.0)
Hemoglobin: 11 g/dL — ABNORMAL LOW (ref 12.0–15.0)
MCH: 27.2 pg (ref 26.0–34.0)
MCHC: 31.7 g/dL (ref 30.0–36.0)
MCV: 85.9 fL (ref 80.0–100.0)
Platelets: 270 10*3/uL (ref 150–400)
RBC: 4.04 MIL/uL (ref 3.87–5.11)
RDW: 18.3 % — ABNORMAL HIGH (ref 11.5–15.5)
WBC: 8.5 10*3/uL (ref 4.0–10.5)
nRBC: 0 % (ref 0.0–0.2)

## 2021-02-20 LAB — GLUCOSE, CAPILLARY
Glucose-Capillary: 219 mg/dL — ABNORMAL HIGH (ref 70–99)
Glucose-Capillary: 230 mg/dL — ABNORMAL HIGH (ref 70–99)
Glucose-Capillary: 233 mg/dL — ABNORMAL HIGH (ref 70–99)
Glucose-Capillary: 242 mg/dL — ABNORMAL HIGH (ref 70–99)

## 2021-02-20 LAB — PHOSPHORUS: Phosphorus: 2.4 mg/dL — ABNORMAL LOW (ref 2.5–4.6)

## 2021-02-20 MED ORDER — HALOPERIDOL LACTATE 2 MG/ML PO CONC
0.5000 mg | ORAL | Status: DC | PRN
  Filled 2021-02-20: qty 0.3

## 2021-02-20 MED ORDER — GLYCOPYRROLATE 1 MG PO TABS
1.0000 mg | ORAL_TABLET | ORAL | Status: DC | PRN
  Filled 2021-02-20: qty 1

## 2021-02-20 MED ORDER — ACETAMINOPHEN 650 MG RE SUPP: 650.0000 mg | Freq: Four times a day (QID) | RECTAL | Status: DC | PRN

## 2021-02-20 MED ORDER — BIOTENE DRY MOUTH MT LIQD: 15.0000 mL | OROMUCOSAL | Status: DC | PRN

## 2021-02-20 MED ORDER — GLYCOPYRROLATE 0.2 MG/ML IJ SOLN: 0.2000 mg | INTRAMUSCULAR | Status: DC | PRN

## 2021-02-20 MED ORDER — ONDANSETRON HCL 4 MG/2ML IJ SOLN: 4.0000 mg | Freq: Four times a day (QID) | INTRAMUSCULAR | Status: DC | PRN

## 2021-02-20 MED ORDER — HALOPERIDOL 0.5 MG PO TABS: 0.5000 mg | ORAL_TABLET | ORAL | Status: DC | PRN

## 2021-02-20 MED ORDER — SCOPOLAMINE 1 MG/3DAYS TD PT72
1.0000 | MEDICATED_PATCH | TRANSDERMAL | Status: DC
  Administered 2021-02-20: 1.5 mg via TRANSDERMAL
  Filled 2021-02-20: qty 1

## 2021-02-20 MED ORDER — ASPIRIN 81 MG PO CHEW: 324.0000 mg | CHEWABLE_TABLET | Freq: Every day | ORAL | Status: DC

## 2021-02-20 MED ORDER — ACETAMINOPHEN 325 MG PO TABS: 650.0000 mg | ORAL_TABLET | Freq: Four times a day (QID) | ORAL | Status: DC | PRN

## 2021-02-20 MED ORDER — POLYVINYL ALCOHOL 1.4 % OP SOLN
1.0000 [drp] | Freq: Four times a day (QID) | OPHTHALMIC | Status: DC | PRN
  Filled 2021-02-20: qty 15

## 2021-02-20 MED ORDER — MORPHINE SULFATE (PF) 2 MG/ML IV SOLN: 1.0000 mg | INTRAVENOUS | Status: DC | PRN

## 2021-02-20 MED ORDER — ONDANSETRON 4 MG PO TBDP: 4.0000 mg | ORAL_TABLET | Freq: Four times a day (QID) | ORAL | Status: DC | PRN

## 2021-02-20 MED ORDER — HALOPERIDOL LACTATE 5 MG/ML IJ SOLN: 0.5000 mg | INTRAMUSCULAR | Status: DC | PRN

## 2021-02-20 NOTE — Care Management Important Message (Signed)
Important Message  Patient Details  Name: Alisha Winters MRN: [id number] Date of Birth: [birth year]   Medicare Important Message Given:  Yes     Orbie Pyo 02/20/2021, 2:21 PM

## 2021-02-20 NOTE — Progress Notes (Signed)
Speech Language Pathology Treatment: Dysphagia  Patient Details Name: Alisha Winters MRN: [id number] DOB: 21-Sep-1932 Today's Date: 02/20/2021 Time: 6945-0388 SLP Time Calculation (min) (ACUTE ONLY): 13 min  Assessment / Plan / Recommendation Clinical Impression  Pt was seen for dysphagia treatment with her daughter, Alisha Winters, present. Her level of alertness was slightly improved compared to yesterday, but she continues to demonstrate difficulty maintaining alertness. She exhibited some vocalization with stimulation, but verbalization was absent and she did not follow commands despite verbal prompts and tactile cues. Oral manipulation and slight mastication were noted once with an ice chip. However, these behaviors could not be replicated and subsequent puree/ice chips boluses remained in the oral cavity until right-sided anterior spillage was demonstrated and/or boluses were cleared from the oral cavity via suction. Pt's daughter was educated regarding pt's performance, and limited progress since the initial evaluation. She verbalized understanding and expressed that she is hopeful, but realistic regarding potential for significant progress. SLP will continue to follow pt.     HPI HPI: Pt is an 88 y.o. female who presented to the ED after being found unresponsive with blood glucose of 48. She was responsive and interactive with nursing staff in ED and sudden mental status change noted with left sided gaze preference, right sided hemiplegia, and aphasia. TNK given at [facility] transferred to [facility] for thrombectomy.  CTA showed distal left M1/prox M2 occlusion. MRI brain 2/14: Acute or subacute left MCA territory infarct involving left frontoparietal cortex and insula. Multiple additional small infarcts in the left occipital lobe and left cerebellum. Pt s/p mechanical thrombectomy 2/13 and was intubated for procedure. ETT 2/13-2/15 (0906). Cortrak placed 2/15. PMH: CVA, DM, HTN, CAD, depression.      SLP Plan   Continue with current plan of care      Recommendations for follow up therapy are one component of a multi-disciplinary discharge planning process, led by the attending physician.  Recommendations may be updated based on patient status, additional functional criteria and insurance authorization.    Recommendations  Diet recommendations: NPO Medication Administration: Via alternative means                Oral Care Recommendations: Oral care QID Follow Up Recommendations: Skilled nursing-short term rehab (<3 hours/day) Assistance recommended at discharge: Frequent or constant Supervision/Assistance SLP Visit Diagnosis: Aphasia (R47.01);Dysphagia, unspecified (R13.10) Plan: Continue with current plan of care         Ambrielle [place] I. Hardin Negus, [place], [place] Office number [phone] Pager [place]  02/20/2021, 10:39 AM

## 2021-02-20 NOTE — Plan of Care (Signed)
°  Problem: Education: Goal: Knowledge of disease or condition will improve Outcome: Not Applicable Goal: Knowledge of secondary prevention will improve (SELECT ALL) Outcome: Not Applicable Goal: Knowledge of patient specific risk factors will improve (INDIVIDUALIZE FOR PATIENT) Outcome: Not Applicable Goal: Individualized Educational Video(s) Outcome: Not Applicable   COMFORT CARE PT

## 2021-02-20 NOTE — Progress Notes (Signed)
Physical Therapy Treatment Patient Details Name: Alisha Winters MRN: [id number] DOB: [birth year] Today's Date: 02/20/2021   History of Present Illness Pt is an 88 yo female who presents to [facility] hospital on 02/16/2021 from SNF where she became suddenly unresponsive. TNK given at [facility] and transferred to [facility] for thrombectomy of L MCA. PMH:  R hip pinning, HTN, depression, CVA, DM, colon cancer, dementia, A fib.    PT Comments    Pt admitted with above diagnosis. Pt was able to sit EOB for up to 15 minutes with min guard assist most of time. Pt following commands with incr time intermittently. Pt lethargic and diffuculty keeping her focused on tasks.  Daughter present and supportive. Pt nurse made aware of swelling in pts left hand/arm.  Will continue PT.  Pt currently with functional limitations due to balance and endurance deficits. Pt will benefit from skilled PT to increase their independence and safety with mobility to allow discharge to the venue listed below.      Recommendations for follow up therapy are one component of a multi-disciplinary discharge planning process, led by the attending physician.  Recommendations may be updated based on patient status, additional functional criteria and insurance authorization.  Follow Up Recommendations  Skilled nursing-short term rehab (<3 hours/day)     Assistance Recommended at Discharge Frequent or constant Supervision/Assistance  Patient can return home with the following Two people to help with walking and/or transfers;Two people to help with bathing/dressing/bathroom;Assistance with cooking/housework;Assistance with feeding;Direct supervision/assist for medications management;Direct supervision/assist for financial management;Assist for transportation;Help with stairs or ramp for entrance   Equipment Recommendations  None recommended by PT (pt owns necessary DME)    Recommendations for Other Services       Precautions / Restrictions  Precautions Precautions: Fall Precaution Comments: R inattention/neglect Restrictions Weight Bearing Restrictions: No     Mobility  Bed Mobility Overal bed mobility: Needs Assistance Bed Mobility: Supine to Sit, Sit to Supine     Supine to sit: +2 for physical assistance, HOB elevated, Total assist Sit to supine: +2 for physical assistance, Max assist   General bed mobility comments: Pt was lethargic on arrival as she had meds last night. Pt needed incr assist to come to eOB but once aroused she did assist with lying down.    Transfers                   General transfer comment: Pt lethargic and did not attempt to stand today.    Ambulation/Gait                   Stairs             Wheelchair Mobility    Modified Rankin (Stroke Patients Only) Modified Rankin (Stroke Patients Only) Pre-Morbid Rankin Score: Moderately severe disability Modified Rankin: Severe disability     Balance Overall balance assessment: Needs assistance Sitting-balance support: Bilateral upper extremity supported, No upper extremity supported, Feet supported Sitting balance-Leahy Scale: Fair Sitting balance - Comments: Pt sat on EOB for about 15 minutes with varying assist initially mod assist but most of the time at eob min guard assist once she got her balance. Pt would move her UEs to command with delayed response time. She also kicked one of her LEs with incr time.  Pt attempted to  comb her hair with left hand with incr assist but would not take the washcloth to her face.  Cognition Arousal/Alertness: Awake/alert Behavior During Therapy: Flat affect Overall Cognitive Status: Difficult to assess                                 General Comments: pt lethargic due to meds and follows commands with combination of verbal/visual/tactile cues intermittently with increased time of up to 30 seconds at times.         Exercises Other Exercises Other Exercises: Educated on sitiing toward her R side to increase attention to R side    General Comments General comments (skin integrity, edema, etc.): VSS      Pertinent Vitals/Pain Pain Assessment Pain Assessment: No/denies pain Faces Pain Scale: No hurt Breathing: normal Negative Vocalization: none Facial Expression: smiling or inexpressive Body Language: relaxed Consolability: no need to console PAINAD Score: 0    Home Living                          Prior Function            PT Goals (current goals can now be found in the care plan section) Acute Rehab PT Goals Patient Stated Goal: to go to rehab Progress towards PT goals: Progressing toward goals    Frequency    Min 3X/week      PT Plan Current plan remains appropriate    Co-evaluation PT/OT/SLP Co-Evaluation/Treatment: Yes Reason for Co-Treatment: Complexity of the patient's impairments (multi-system involvement);For patient/therapist safety PT goals addressed during session: Mobility/safety with mobility        AM-PAC PT "6 Clicks" Mobility   Outcome Measure  Help needed turning from your back to your side while in a flat bed without using bedrails?: Total Help needed moving from lying on your back to sitting on the side of a flat bed without using bedrails?: Total Help needed moving to and from a bed to a chair (including a wheelchair)?: Total Help needed standing up from a chair using your arms (e.g., wheelchair or bedside chair)?: Total Help needed to walk in hospital room?: Total Help needed climbing 3-5 steps with a railing? : Total 6 Click Score: 6    End of Session   Activity Tolerance: Patient limited by fatigue;Patient limited by lethargy Patient left: in bed;with call bell/phone within reach;with bed alarm set;with restraints reapplied;with family/visitor present Nurse Communication: Mobility status;Need for lift equipment PT Visit  Diagnosis: Other abnormalities of gait and mobility (R26.89);Muscle weakness (generalized) (M62.81);Other symptoms and signs involving the nervous system (R29.898)     Time: 2426-8341 PT Time Calculation (min) (ACUTE ONLY): 26 min  Charges:  $Therapeutic Activity: 8-22 mins                     Alisha Winters,PT Acute Rehab Services [phone] (pager)    Alisha Winters 02/20/2021, 12:37 PM

## 2021-02-20 NOTE — Progress Notes (Addendum)
STROKE TEAM PROGRESS NOTE   INTERVAL HISTORY Patient is seen in her room with her sister at the bedside.  She was agitated overnight and was given melatonin and Seroquel.  She did attempt to work with PT/OT/SLP this morning but was too tired to do much.  She remains very drowsy at this time.  Her family has opted for a course of comfort care. Vitals:   02/20/21 0402 02/20/21 0500 02/20/21 0803 02/20/21 1111  BP: 97/82  (!) 160/70 127/68  Pulse: (!) 53 61 62 (!) 46  Resp: 20  20   Temp: 97.7 F (36.5 C)  97.6 F (36.4 C) 97.6 F (36.4 C)  TempSrc: Oral  Oral Oral  SpO2: 100%  100% 99%  Weight:  108.5 kg    Height:       CBC:  Recent Labs  Lab 02/16/21 1100 02/16/21 1523 02/18/21 0720 02/20/21 0917  WBC 6.9  --  9.3 8.5  NEUTROABS 4.6  --   --   --   HGB 12.9   < > 12.2 11.0*  HCT 41.1   < > 37.5 34.7*  MCV 86.2  --  83.9 85.9  PLT 270  --  250 270   < > = values in this interval not displayed.    Basic Metabolic Panel:  Recent Labs  Lab 02/19/21 0426 02/19/21 1626 02/20/21 0917  NA 138  --  139  K 4.0  --  3.7  CL 103  --  108  CO2 23  --  26  GLUCOSE 278*  --  256*  BUN 24*  --  40*  CREATININE 0.95  --  0.71  CALCIUM 7.9*  --  7.8*  MG 2.2 2.1 2.1  PHOS 2.9 2.2* 2.4*    Lipid Panel:  Recent Labs  Lab 02/17/21 0529  CHOL 84  TRIG 97  HDL 24*  CHOLHDL 3.5  VLDL 19  LDLCALC 41    HgbA1c:  Recent Labs  Lab 02/16/21 1520  HGBA1C 6.2*    Urine Drug Screen:  Recent Labs  Lab 02/16/21 1151  LABOPIA NONE DETECTED  COCAINSCRNUR NONE DETECTED  LABBENZ NONE DETECTED  AMPHETMU NONE DETECTED  THCU NONE DETECTED  LABBARB NONE DETECTED     Alcohol Level No results for input(s): ETH in the last 168 hours.  IMAGING past 24 hours No results found.  PHYSICAL EXAM General:  Obese Caucasian female in no acute distress  Neurological:  Patient's eyes are closed and she resists opening them.  PERRL.  She does not move extremities to  command.  ASSESSMENT/PLAN [name] is a 88 y.o. female with history of dementia, atrial fibrillation, prior stroke, colon cancer and T2DM presenting with right arm and leg weakness and left gaze deviation.  2/13- Patient was given TNK and transferred here for thrombectomy.  Thrombectomy was successful with TICI3 recanalization on the left M1 MCA.  Patient remained intubated after the procedure.  Upon discussion with patient's sister, code status was changed to DNR and decision was made not to reintubate after extubation. Extubated 2/15.  Cortrak placed yesterday and tube feeds initiated. Family has opted for comfort care today.  Stroke:  left MCA infarct  likely secondary due to left middle cerebral artery occlusion etiology likely A-fib not on anticoagulation  code Stroke CT head No acute abnormality. Chronic left cerebellar infarct CTA head & neck LVO in distal left M1 segment, 40% narrowing of left ICA Post IR CT No acute abnormality,  old cerebellar infarct MRI  left MCA territory infarct involving left frontoparietal cortex and insula, additional small infarcts in left occipital lobe and left cerebellum 2D Echo EF 60-65%, normal atrial septum, no intracardiac source of embolism LDL 41 HgbA1c 6.2 EEG focal left hemispheric slowing.  No seizure activity VTE prophylaxis - lovenox aspirin 81 mg daily prior to admission, now on aspirin 81 mg daily.  Therapy recommendations :SNF  Disposition:  comfort care  Hypertension Home meds:  lisinopril 10 mg daily Stable Keep SBP 120-140 Cleviprex as needed Long-term BP goal normotensive  Hyperlipidemia Home meds:  rosuvastatin 10 mg daily, resumed in hospital LDL 41, goal < 70 Continue statin at discharge  Diabetes type II Controlled Home meds:  insulin 70/30 unknown dose with meals HgbA1c 6.2, goal < 7.0 CBGs SSI and 5u levimir daily  Other Stroke Risk Factors Advanced Age >/= 39  Obesity, Body mass index is 32.44 kg/m., BMI  >/= 30 associated with increased stroke risk, recommend weight loss, diet and exercise as appropriate  Hx stroke  Other Active Problems Acute respiratory failure Patient was intubated for thrombectomy Extubated 2/15- now DNR/DNI Management per [facility] day # 4  Patient seen and examined by NP/APP with MD. MD to update note as needed.   [place] , MSN, AGACNP-BC [facility] See Amion for schedule and pager information 02/20/2021 2:49 PM   ATTENDING NOTE: I reviewed above note and agree with the assessment and plan. Pt was seen and examined.   88 year old female with history of dementia, A-fib on Coumadin, diabetes, colon cancer and stroke admitted for left gaze, right-sided weakness, INR 1.3, status post TNK.  EEG negative for seizure.  CT no acute abnormality, old left cerebellar infarct.  CT head and neck distal left M1 occlusion and left ICA 40% stenosis.  Had IR with TICI3.  MRI showed left MCA moderate-sized stroke, and small left occipital and left cerebellum infarct.  EF 60 to 65%, LDL 41, A1c 6.2.  Creatinine 0.95.  Patient did not pass swallow, had a core track placed on tube feeding.  Met 2 daughters at bedside, they both requesting comfort care measures given patient poor baseline and likely poor prognosis given current stroke.  Patient is DNR and family against PEG tube for SNF placement, therefore they feel best choice is comfort care measures per patient wishes.  I respect family decision and started comfort care measures.  Social worker and Tourist information centre manager contacted for residential hospice placement.  For detailed assessment and plan, please refer to above as I have made changes wherever appropriate.   [name], MD PhD Stroke Neurology 02/20/2021 10:55 PM    To contact Stroke Continuity provider, please refer to [email or web address] After hours, contact General Neurology

## 2021-02-20 NOTE — Progress Notes (Signed)
Occupational Therapy Treatment Patient Details Name: Alisha Winters MRN: [id number] DOB: 26-Jun-1932 Today's Date: 02/20/2021   History of present illness Pt is an 88 yo female who presents to [facility] hospital on 02/16/2021 from SNF where she became suddenly unresponsive. TNK given at [facility] and transferred to [facility] for thrombectomy of L MCA. PMH:  R hip pinning, HTN, depression, CVA, DM, colon cancer, dementia, A fib.   OT comments  Patient lethargic this treatment session but was able to tolerate up to 15 minutes of sitting on EOB. Grooming attempted with washing face and combing hair with patient unable to manage washcloth or comb even with hand over hand assist. Patient to continue to be followed by acute OT.    Recommendations for follow up therapy are one component of a multi-disciplinary discharge planning process, led by the attending physician.  Recommendations may be updated based on patient status, additional functional criteria and insurance authorization.    Follow Up Recommendations  Skilled nursing-short term rehab (<3 hours/day)    Assistance Recommended at Discharge Frequent or constant Supervision/Assistance  Patient can return home with the following  Two people to help with walking and/or transfers;Two people to help with bathing/dressing/bathroom;Assistance with cooking/housework;Direct supervision/assist for medications management;Direct supervision/assist for financial management;Assist for transportation;Help with stairs or ramp for entrance   Equipment Recommendations  None recommended by OT    Recommendations for Other Services      Precautions / Restrictions Precautions Precautions: Fall Precaution Comments: R inattention/neglect Restrictions Weight Bearing Restrictions: No       Mobility Bed Mobility                    Transfers                         Balance                                           ADL either  performed or assessed with clinical judgement   ADL Overall ADL's : Needs assistance/impaired     Grooming: Wash/dry hands;Wash/dry face;Brushing hair;Maximal assistance;Sitting Grooming Details (indicate cue type and reason): hand over hand attempted for grooming tasks with patient being unable to manage washcloth or comb                               General ADL Comments: Grooming attempted while seated on EOB with patient unable to [employer] or comb    Extremity/Trunk Assessment Upper Extremity Assessment RUE Deficits / Details: more proximal movement; not moving on command but moving spontaneously; no active hand movement observed; most likely impaired sensation as pt unaware of position of hand RUE Sensation: decreased light touch;decreased proprioception RUE Coordination: decreased fine motor;decreased gross motor            Vision       Perception     Praxis      Cognition                                                Exercises      Shoulder Instructions       General Comments VSS  Pertinent Vitals/ Pain       Pain Assessment Pain Intervention(s): Monitored during session  Home Living                                          Prior Functioning/Environment              Frequency  Min 2X/week        Progress Toward Goals  OT Goals(current goals can now be found in the care plan section)  Progress towards OT goals: Progressing toward goals  Acute Rehab OT Goals Patient Stated Goal: get more rehab OT Goal Formulation: With family Time For Goal Achievement: 03/04/21 Potential to Achieve Goals: Fair ADL Goals Pt Will Perform Grooming: with min assist;sitting Pt Will Perform Upper Body Bathing: with min assist;sitting Pt Will Perform Lower Body Bathing: with mod assist;bed level Pt Will Transfer to Toilet: with +2 assist;bedside commode;squat pivot transfer;with mod  assist Additional ADL Goal #1: Pt will demonstrate selective attention during ADL tasks with minimal redirectional cues in non-distracting environment  Plan Discharge plan remains appropriate    Co-evaluation    PT/OT/SLP Co-Evaluation/Treatment: Yes Reason for Co-Treatment: Complexity of the patient's impairments (multi-system involvement);For patient/therapist safety PT goals addressed during session: Mobility/safety with mobility OT goals addressed during session: ADL's and self-care      AM-PAC OT "6 Clicks" Daily Activity     Outcome Measure   Help from another person eating meals?: Total Help from another person taking care of personal grooming?: A Lot Help from another person toileting, which includes using toliet, bedpan, or urinal?: Total Help from another person bathing (including washing, rinsing, drying)?: A Lot Help from another person to put on and taking off regular upper body clothing?: A Lot Help from another person to put on and taking off regular lower body clothing?: Total 6 Click Score: 9    End of Session    OT Visit Diagnosis: Unsteadiness on feet (R26.81);Other abnormalities of gait and mobility (R26.89);Muscle weakness (generalized) (M62.81);Low vision, both eyes (H54.2);Apraxia (R48.2);Other symptoms and signs involving cognitive function;Other symptoms and signs involving the nervous system (R29.898);Dizziness and giddiness (R42);Hemiplegia and hemiparesis Hemiplegia - Right/Left: Right Hemiplegia - dominant/non-dominant: Dominant Hemiplegia - caused by: Cerebral infarction   Activity Tolerance Patient limited by lethargy   Patient Left in bed;with call bell/phone within reach;with bed alarm set;with family/visitor present   Nurse Communication Mobility status        Time: 8329-1916 OT Time Calculation (min): 26 min  Charges: OT General Charges $OT Visit: 1 Visit OT Treatments $Self Care/Home Management : 8-22 mins  Lodema Hong, [place]  Pager [phone] Office [place] 02/20/2021, 12:56 PM

## 2021-02-20 NOTE — TOC Progression Note (Signed)
Transition of Care [facility]) - Progression Note    Patient Details  Name: Alisha Winters MRN: [id number] Date of Birth: Jun 16, 1932  Transition of Care [facility]) CM/SW [place], [place] Phone Number: 02/20/2021, 2:38 PM  Clinical Narrative:   CSW alerted by MD that family is interested in transition to comfort measures, preference for Hospice Home in [place]. CSW sent referral to [facility], they will review. CSW met with daughter at bedside to discuss, they were appreciative of update. CSW to follow.    Expected Discharge Plan: [place]    Expected Discharge Plan and Services Expected Discharge Plan: [place]                                               Social Determinants of Health (SDOH) Interventions    Readmission Risk Interventions No flowsheet data found.

## 2021-02-20 NOTE — Progress Notes (Signed)
Inpatient Diabetes Program Recommendations  AACE/ADA: New Consensus Statement on Inpatient Glycemic Control  Target Ranges:  Prepandial:   less than 140 mg/dL      Peak postprandial:   less than 180 mg/dL (1-2 hours)      Critically ill patients:  140 - 180 mg/dL   Review of Glycemic Control  Latest Reference Range & Units 02/19/21 03:26 02/19/21 07:36 02/19/21 11:38 02/19/21 15:15 02/19/21 20:39 02/20/21 00:10 02/20/21 04:01 02/20/21 08:02 02/20/21 12:17  Glucose-Capillary 70 - 99 mg/dL 261 (H) 293 (H) 221 (H) 261 (H) 201 (H) 219 (H) 230 (H) 233 (H) 242 (H)   Diabetes history: DM2 Outpatient Diabetes medications: 70/30 BID (per office note on 05/16/20 was prescribed 70/30 48 units in AM, 70/30  20 units QPM) Current orders for Inpatient glycemic control: Novolog 0-15 units Q4H Levemir 5 units  Inpatient Diabetes Program Recommendations:    Insulin: Please consider ordering Novolog 4 units Q4H for tube feeding coverage.   If tube feeding is stopped or held then Novolog tube feeding coverage should also be stopped or held.  Thanks, Tama Headings RN, MSN, BC-ADM Inpatient Diabetes Coordinator Team Pager [phone] (8a-5p)

## 2021-02-20 NOTE — Progress Notes (Signed)
Manufacturing engineer [facility]) Hospital Liaison Note  Referral received  from [facility] for patient/family interest in [facility]. Dawn(dtr) confirmed interest and would like patient transferred when bed is available.   [place] liaison will evaluate at bedside tomorrow for eligibility.   Hospice eligibility pending.   Please call with any questions or concerns. Thank you.   Buck Mam [facility] Liaison [phone]

## 2021-02-21 DIAGNOSIS — I63512 Cerebral infarction due to unspecified occlusion or stenosis of left middle cerebral artery: Secondary | ICD-10-CM | POA: Diagnosis not present

## 2021-02-21 DIAGNOSIS — Z515 Encounter for palliative care: Secondary | ICD-10-CM

## 2021-02-21 MED ORDER — HALOPERIDOL LACTATE 2 MG/ML PO CONC: 0.6000 mg | ORAL | 0 refills | Status: AC | PRN

## 2021-02-21 MED ORDER — MORPHINE SULFATE (PF) 2 MG/ML IV SOLN: 1.0000 mg | INTRAVENOUS | 0 refills | Status: AC | PRN

## 2021-02-21 MED ORDER — POLYVINYL ALCOHOL 1.4 % OP SOLN: 1.0000 [drp] | Freq: Four times a day (QID) | OPHTHALMIC | 0 refills | Status: AC | PRN

## 2021-02-21 MED ORDER — ONDANSETRON 4 MG PO TBDP: 4.0000 mg | ORAL_TABLET | Freq: Four times a day (QID) | ORAL | 0 refills | Status: AC | PRN

## 2021-02-21 MED ORDER — GLYCOPYRROLATE 0.2 MG/ML IJ SOLN: 0.2000 mg | INTRAMUSCULAR | 2 refills | Status: AC | PRN

## 2021-02-21 MED ORDER — SCOPOLAMINE 1 MG/3DAYS TD PT72: 1.0000 | MEDICATED_PATCH | TRANSDERMAL | 12 refills | Status: AC

## 2021-02-21 MED ORDER — ACETAMINOPHEN 650 MG RE SUPP: 650.0000 mg | Freq: Four times a day (QID) | RECTAL | 0 refills | Status: AC | PRN

## 2021-02-21 MED ORDER — HALOPERIDOL LACTATE 5 MG/ML IJ SOLN: 0.5000 mg | INTRAMUSCULAR | 2 refills | Status: AC | PRN

## 2021-02-21 NOTE — Progress Notes (Addendum)
Manufacturing engineer [facility]) Hospital Liaison Note  Received request from [facility] for family interest in [place]. Visited patient at bedside to complete [facility] assessment.  Approval for Hospice Home is determined by [facility] MD. Once [facility] MD has determined Hospice Home eligibility, [place] will update hospital staff and family. Patient has been approved  Addendum: 4:00p  EMS notified of patient D/C and transport arranged by TOC/Elizabeth. NP/de Yolanda Manges, Cortney E, also notified of transport arrangement.    Please send signed DNR form with patient and RN call report to [phone].    Please do not hesitate to call with any hospice related questions.    Thank you for the opportunity to participate in this patient's care.  Daphene Calamity, MSW [facility] Liaison  [phone]

## 2021-02-21 NOTE — Discharge Summary (Addendum)
Stroke Discharge Summary  Patient ID: Alisha Winters   MRN: [id number]      DOB: June 29, 1932  Date of Admission: 02/16/2021 Date of Discharge: 02/21/2021  Attending Physician:  Stroke, Md, MD, Stroke MD Consultant(s):    pulmonary/intensive care WOCN Patient's PCP:  Derinda Late, MD  DISCHARGE DIAGNOSIS: left MCA stroke Principal Problem:   Stroke (cerebrum) [facility]) Active Problems:   Coronary artery disease   Depression   Diabetes mellitus without complication ([place])   Hypertension   Anemia   Chronic a-fib ([place])   Hyperlipidemia   Delirium   Atelectasis   Obesity (BMI 30.0-34.9)   Pressure ulcer of coccygeal region, stage 2 ([place])   Hospice care patient   Allergies as of 02/21/2021       Reactions   Penicillins Rash   Atorvastatin Diarrhea   Citalopram Nausea Only   Codeine Itching   Meloxicam Nausea Only   Simvastatin Other (See Comments)   Muscle pain   Hydrochlorothiazide Other (See Comments)   Cephalexin Nausea Only        Medication List     STOP taking these medications    amLODipine 10 MG tablet Commonly known as: NORVASC   aspirin 81 MG tablet   furosemide 20 MG tablet Commonly known as: LASIX   insulin NPH-regular Human (70-30) 100 UNIT/ML injection   isosorbide mononitrate 30 MG 24 hr tablet Commonly known as: IMDUR   levofloxacin 500 MG tablet Commonly known as: Levaquin   lisinopril 10 MG tablet Commonly known as: ZESTRIL   metoprolol succinate 50 MG 24 hr tablet Commonly known as: TOPROL-XL   rosuvastatin 10 MG tablet Commonly known as: CRESTOR   warfarin 5 MG tablet Commonly known as: COUMADIN       TAKE these medications    acetaminophen 650 MG suppository Commonly known as: TYLENOL Place 1 suppository (650 mg total) rectally every 6 (six) hours as needed for mild pain (or Fever >/= 101).   glycopyrrolate 0.2 MG/ML injection Commonly known as: ROBINUL Inject 1 mL (0.2 mg total) into the skin every 4 (four) hours  as needed (excessive secretions).   haloperidol 2 MG/ML solution Commonly known as: HALDOL Place 0.3 mLs (0.6 mg total) under the tongue every 4 (four) hours as needed for agitation (or delirium).   haloperidol lactate 5 MG/ML injection Commonly known as: HALDOL Inject 0.1 mLs (0.5 mg total) into the vein every 4 (four) hours as needed (or delirium).   morphine (PF) 2 MG/ML injection Inject 0.5 mLs (1 mg total) into the vein every hour as needed (or dyspnea).   ondansetron 4 MG disintegrating tablet Commonly known as: ZOFRAN-ODT Take 1 tablet (4 mg total) by mouth every 6 (six) hours as needed for nausea.   polyvinyl alcohol 1.4 % ophthalmic solution Commonly known as: LIQUIFILM TEARS Place 1 drop into both eyes 4 (four) times daily as needed for dry eyes.   scopolamine 1 MG/3DAYS Commonly known as: TRANSDERM-SCOP Place 1 patch (1.5 mg total) onto the skin every 3 (three) days. Start taking on: February 23, 2021        LABORATORY STUDIES CBC    Component Value Date/Time   WBC 8.5 02/20/2021 0917   RBC 4.04 02/20/2021 0917   HGB 11.0 (L) 02/20/2021 0917   HCT 34.7 (L) 02/20/2021 0917   PLT 270 02/20/2021 0917   MCV 85.9 02/20/2021 0917   MCH 27.2 02/20/2021 0917   MCHC 31.7 02/20/2021 0917   RDW 18.3 (  H) 02/20/2021 0917   LYMPHSABS 1.7 02/16/2021 1100   MONOABS 0.5 02/16/2021 1100   EOSABS 0.0 02/16/2021 1100   BASOSABS 0.0 02/16/2021 1100   CMP    Component Value Date/Time   NA 139 02/20/2021 0917   K 3.7 02/20/2021 0917   CL 108 02/20/2021 0917   CO2 26 02/20/2021 0917   GLUCOSE 256 (H) 02/20/2021 0917   BUN 40 (H) 02/20/2021 0917   CREATININE 0.71 02/20/2021 0917   CALCIUM 7.8 (L) 02/20/2021 0917   PROT 5.5 (L) 02/16/2021 1100   ALBUMIN 2.2 (L) 02/16/2021 1100   AST 16 02/16/2021 1100   ALT 11 02/16/2021 1100   ALKPHOS 69 02/16/2021 1100   BILITOT 0.5 02/16/2021 1100   GFRNONAA >60 02/20/2021 0917   GFRAA >60 07/18/2019 0420   COAGS Lab Results   Component Value Date   INR 1.3 (H) 02/16/2021   INR 1.8 (H) 07/19/2019   INR 1.6 (H) 07/18/2019   Lipid Panel    Component Value Date/Time   CHOL 84 02/17/2021 0529   TRIG 97 02/17/2021 0529   HDL 24 (L) 02/17/2021 0529   CHOLHDL 3.5 02/17/2021 0529   VLDL 19 02/17/2021 0529   LDLCALC 41 02/17/2021 0529   HgbA1C  Lab Results  Component Value Date   HGBA1C 6.2 (H) 02/16/2021   Urinalysis    Component Value Date/Time   COLORURINE YELLOW (A) 02/16/2021 1040   APPEARANCEUR CLOUDY (A) 02/16/2021 1040   LABSPEC 1.042 (H) 02/16/2021 1040   PHURINE 6.0 02/16/2021 1040   GLUCOSEU 50 (A) 02/16/2021 1040   HGBUR NEGATIVE 02/16/2021 1040   BILIRUBINUR NEGATIVE 02/16/2021 1040   KETONESUR NEGATIVE 02/16/2021 1040   PROTEINUR 100 (A) 02/16/2021 1040   NITRITE NEGATIVE 02/16/2021 1040   LEUKOCYTESUR LARGE (A) 02/16/2021 1040   Urine Drug Screen     Component Value Date/Time   LABOPIA NONE DETECTED 02/16/2021 1151   COCAINSCRNUR NONE DETECTED 02/16/2021 1151   LABBENZ NONE DETECTED 02/16/2021 1151   AMPHETMU NONE DETECTED 02/16/2021 1151   THCU NONE DETECTED 02/16/2021 1151   LABBARB NONE DETECTED 02/16/2021 1151    Alcohol Level No results found for: ETH   SIGNIFICANT DIAGNOSTIC STUDIES CT HEAD WO CONTRAST (5MM)  Result Date: 02/16/2021 CLINICAL DATA:  Stroke follow-up EXAM: CT HEAD WITHOUT CONTRAST TECHNIQUE: Contiguous axial images were obtained from the base of the skull through the vertex without intravenous contrast. RADIATION DOSE REDUCTION: This exam was performed according to the departmental dose-optimization program which includes automated exposure control, adjustment of the mA and/or kV according to patient size and/or use of iterative reconstruction technique. COMPARISON:  None. FINDINGS: Brain: There is no mass, hemorrhage or extra-axial collection. There is generalized atrophy without lobar predilection. Hypodensity of the white matter is most commonly associated  with chronic microvascular disease. Unchanged appearance of old left cerebellar infarct Vascular: Atherosclerotic calcification of the internal carotid arteries at the skull base. No abnormal hyperdensity of the major intracranial arteries or dural venous sinuses. Skull: The visualized skull base, calvarium and extracranial soft tissues are normal. Sinuses/Orbits: No fluid levels or advanced mucosal thickening of the visualized paranasal sinuses. No mastoid or middle ear effusion. The orbits are normal. IMPRESSION: 1. No acute intracranial abnormality. 2. 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 Electronically Signed   By: Ulyses Jarred M.D.   On: 02/16/2021 23:13   MR BRAIN WO CONTRAST  Result Date: 02/17/2021 CLINICAL DATA:  Neuro deficit, acute, stroke suspected EXAM: MRI  HEAD WITHOUT CONTRAST TECHNIQUE: Multiplanar, multiecho pulse sequences of the brain and surrounding structures were obtained without intravenous contrast. COMPARISON:  CT head February 16, 2021. FINDINGS: Brain: Acute or subacute left MCA territory infarct involving left frontal and parietal cortex and insula. Multiple additional small infarcts in the left occipital lobe and left cerebellum. No significant mass effect. No hydrocephalus, mass lesion, midline shift, or extra-axial fluid collection. Cerebral atrophy. Vascular: Major arterial flow voids are maintained at the skull base. Skull and upper cervical spine: Normal marrow signal. Sinuses/Orbits: Mild-to-moderate paranasal sinus mucosal thickening with small air-fluid level in left sphenoid sinus. Unremarkable orbits. Other: Small bilateral mastoid effusions. IMPRESSION: Acute or subacute left MCA territory infarct involving left frontoparietal cortex and insula. Multiple additional small infarcts in the left occipital lobe and left cerebellum. Given involvement of multiple vascular territories, consider an embolic etiology. No mass effect.  Electronically Signed   By: Margaretha Sheffield M.D.   On: 02/17/2021 11:31   DG Chest Right Decubitus  Result Date: 02/16/2021 CLINICAL DATA:  Orogastric tube placement. EXAM: CHEST - RIGHT DECUBITUS COMPARISON:  None. FINDINGS: Single right lateral decubitus view. Feeding tube coursing below the diaphragm with distal tip projecting over the gastric fundus. Paucity of bowel gas. Visualized lung bases are clear. Elevation of the right hemidiaphragm. IMPRESSION: Feeding tube with distal tip projecting over the gastric fundus. Electronically Signed   By: Keane Police D.O.   On: 02/16/2021 15:20   IR CT Head Ltd  Result Date: 02/16/2021 INDICATION: 88 year old female with past medical history significant for coronary artery disease, AFib on Coumadin , diabetes mellitus, hypertension and stroke. She presented to outside hospital with hypoglycemia on 02/15/2021. At approximately 10:35 a.m. on 02/16/2021 she developed right-sided weakness and became unresponsive. NIH SS 24; baseline modified Rankin scale. Head CT was performed showing no evidence of acute territorial infarct or hemorrhage (ASPECTS 10). CT angiogram of the head and neck showed an occlusion of the distal left M1/MCA. She was transferred to our service for a diagnostic cerebral angiogram and mechanical thrombectomy. EXAM: ULTRASOUND-GUIDED VASCULAR ACCESS DIAGNOSTIC CEREBRAL ANGIOGRAM MECHANICAL THROMBECTOMY FLAT PANEL HEAD CT COMPARISON:  CT angiogram of the head and neck February 16, 2021 MEDICATIONS: Refer to anesthesia documentation. ANESTHESIA/SEDATION: The procedure was performed under general anesthesia. CONTRAST:  64 mL of Omnipaque 300 milligram/mL FLUOROSCOPY: Radiation Exposure Index (as provided by the fluoroscopic device): 748.2 mGy Kerma COMPLICATIONS: None immediate. TECHNIQUE: Informed written consent was weight due to emergency nature of intervention. Maximal Sterile Barrier Technique was utilized including caps, mask, sterile gowns,  sterile gloves, sterile drape, hand hygiene and skin antiseptic. A timeout was performed prior to the initiation of the procedure. The right groin was prepped and draped in the usual sterile fashion. Using a micropuncture kit and the modified Seldinger technique, access was gained to the right common femoral artery and an 8 French sheath was placed. Real-time ultrasound guidance was utilized for vascular access including the acquisition of a permanent ultrasound image documenting patency of the accessed vessel. Under fluoroscopy, a Zoom 88 guide catheter was navigated over a 6 [place] Berenstein 2 catheter and a 0.035" Terumo Glidewire into the aortic arch. Given type 3 arch, decision was made to exchange the [place] 2 catheter for a 6 French Simmons 2 catheter. The tip was reformed in the aortic arch. The catheter was placed into the left common carotid artery. Frontal and lateral angiograms of the neck were obtained. The diagnostic catheter was removed. The guide catheter was advanced over  the wire into the left internal carotid artery. Frontal and lateral angiograms of the head were obtained. FINDINGS: 1. A right common femoral artery is patent and has normal caliber, adequate for vascular access. 2. Atherosclerotic changes are seen in the left carotid bifurcation without hemodynamically significant stenosis. 3. Occlusion of the distal left M1/MCA just distal to the takeoff of the anterior temporal artery. PROCEDURE: Using biplane roadmap, a zoom 71 aspiration catheter was navigated over an Aristotle 24 microguidewire into the cavernous segment of the left ICA. Attempts to navigate the catheter into the distal ICA proved unsuccessful. Under biplane roadmap, a zoom 71 aspiration catheter was navigated over a phenom 21 microcatheter and a synchro support microguidewire into the supraclinoid left ICA. The microcatheter was then navigated over the wire into the distal left M2/MCA inferior division branch. Then, a 4  x 40 mm solitaire stent retriever was deployed spanning the distal M1 and proximal M2 segment. The device was allowed to intercalated with the clot for 4 minutes. The microcatheter was removed. The aspiration catheter was advanced to the level of occlusion and connected to a penumbra aspiration pump. The thrombectomy device and aspiration catheter were removed under constant aspiration. The guiding catheter was aspirated debris. Left internal carotid artery angiograms with magnified frontal and lateral frontal lateral views of the head followed by left anterior oblique and lateral views of the entire head showed complete recanalization of the left MCA vascular tree. No evidence of thromboembolic complication. Flat panel CT of the head was obtained and post processed in a separate workstation with concurrent attending physician supervision. Selected images were sent to PACS. No evidence of hemorrhagic complication. The guiding catheter was then retracted into the right common carotid artery under continues contrast injection. No evidence of iatrogenic injury. Right common femoral artery angiogram was obtained in right anterior oblique view. The puncture is at the level of the common femoral artery. The artery has normal caliber, adequate for closure device. The sheath was exchanged over the wire for a Perclose prostyle which was utilized for access closure. Immediate hemostasis was achieved. IMPRESSION: 1. Successful mechanical thrombectomy performed for treatment of a distal left M1/MCA occlusion with complete recanalization after 1 pass (TICI 3). 2. No evidence of hemorrhagic or thromboembolic complication. PLAN: 1. Transferred to ICU for continued care. 2. SBP 120-140 mmHg x 24 hours. Electronically Signed   By: Pedro Earls M.D.   On: 02/16/2021 14:48   IR US Guide Vasc Access Right  Result Date: 02/16/2021 INDICATION: 88 year old female with past medical history significant for coronary  artery disease, AFib on Coumadin , diabetes mellitus, hypertension and stroke. She presented to outside hospital with hypoglycemia on 02/15/2021. At approximately 10:35 a.m. on 02/16/2021 she developed right-sided weakness and became unresponsive. NIH SS 24; baseline modified Rankin scale. Head CT was performed showing no evidence of acute territorial infarct or hemorrhage (ASPECTS 10). CT angiogram of the head and neck showed an occlusion of the distal left M1/MCA. She was transferred to our service for a diagnostic cerebral angiogram and mechanical thrombectomy. EXAM: ULTRASOUND-GUIDED VASCULAR ACCESS DIAGNOSTIC CEREBRAL ANGIOGRAM MECHANICAL THROMBECTOMY FLAT PANEL HEAD CT COMPARISON:  CT angiogram of the head and neck February 16, 2021 MEDICATIONS: Refer to anesthesia documentation. ANESTHESIA/SEDATION: The procedure was performed under general anesthesia. CONTRAST:  64 mL of Omnipaque 300 milligram/mL FLUOROSCOPY: Radiation Exposure Index (as provided by the fluoroscopic device): 759.1 mGy Kerma COMPLICATIONS: None immediate. TECHNIQUE: Informed written consent was weight due to emergency nature of intervention.  Maximal Sterile Barrier Technique was utilized including caps, mask, sterile gowns, sterile gloves, sterile drape, hand hygiene and skin antiseptic. A timeout was performed prior to the initiation of the procedure. The right groin was prepped and draped in the usual sterile fashion. Using a micropuncture kit and the modified Seldinger technique, access was gained to the right common femoral artery and an 8 French sheath was placed. Real-time ultrasound guidance was utilized for vascular access including the acquisition of a permanent ultrasound image documenting patency of the accessed vessel. Under fluoroscopy, a Zoom 88 guide catheter was navigated over a 6 [place] Berenstein 2 catheter and a 0.035" Terumo Glidewire into the aortic arch. Given type 3 arch, decision was made to exchange the [place] 2  catheter for a 6 French Simmons 2 catheter. The tip was reformed in the aortic arch. The catheter was placed into the left common carotid artery. Frontal and lateral angiograms of the neck were obtained. The diagnostic catheter was removed. The guide catheter was advanced over the wire into the left internal carotid artery. Frontal and lateral angiograms of the head were obtained. FINDINGS: 1. A right common femoral artery is patent and has normal caliber, adequate for vascular access. 2. Atherosclerotic changes are seen in the left carotid bifurcation without hemodynamically significant stenosis. 3. Occlusion of the distal left M1/MCA just distal to the takeoff of the anterior temporal artery. PROCEDURE: Using biplane roadmap, a zoom 71 aspiration catheter was navigated over an Aristotle 24 microguidewire into the cavernous segment of the left ICA. Attempts to navigate the catheter into the distal ICA proved unsuccessful. Under biplane roadmap, a zoom 71 aspiration catheter was navigated over a phenom 21 microcatheter and a synchro support microguidewire into the supraclinoid left ICA. The microcatheter was then navigated over the wire into the distal left M2/MCA inferior division branch. Then, a 4 x 40 mm solitaire stent retriever was deployed spanning the distal M1 and proximal M2 segment. The device was allowed to intercalated with the clot for 4 minutes. The microcatheter was removed. The aspiration catheter was advanced to the level of occlusion and connected to a penumbra aspiration pump. The thrombectomy device and aspiration catheter were removed under constant aspiration. The guiding catheter was aspirated debris. Left internal carotid artery angiograms with magnified frontal and lateral frontal lateral views of the head followed by left anterior oblique and lateral views of the entire head showed complete recanalization of the left MCA vascular tree. No evidence of thromboembolic complication. Flat panel  CT of the head was obtained and post processed in a separate workstation with concurrent attending physician supervision. Selected images were sent to PACS. No evidence of hemorrhagic complication. The guiding catheter was then retracted into the right common carotid artery under continues contrast injection. No evidence of iatrogenic injury. Right common femoral artery angiogram was obtained in right anterior oblique view. The puncture is at the level of the common femoral artery. The artery has normal caliber, adequate for closure device. The sheath was exchanged over the wire for a Perclose prostyle which was utilized for access closure. Immediate hemostasis was achieved. IMPRESSION: 1. Successful mechanical thrombectomy performed for treatment of a distal left M1/MCA occlusion with complete recanalization after 1 pass (TICI 3). 2. No evidence of hemorrhagic or thromboembolic complication. PLAN: 1. Transferred to ICU for continued care. 2. SBP 120-140 mmHg x 24 hours. Electronically Signed   By: Pedro Earls M.D.   On: 02/16/2021 14:48   Portable Chest xray  Result  Date: 02/17/2021 CLINICAL DATA:  Endotracheal tube. EXAM: PORTABLE CHEST 1 VIEW COMPARISON:  February 16, 2021. FINDINGS: Stable cardiomediastinal silhouette. Endotracheal and nasogastric tubes are unchanged in position. Right lung is clear. Minimal left basilar subsegmental atelectasis is noted. Bony thorax is unremarkable. IMPRESSION: Stable support apparatus. Minimal left basilar subsegmental atelectasis. Electronically Signed   By: Marijo Conception M.D.   On: 02/17/2021 08:30   Portable Chest x-ray  Result Date: 02/16/2021 CLINICAL DATA:  Intubated, enteric tube placement EXAM: PORTABLE CHEST 1 VIEW COMPARISON:  Chest radiograph from earlier today. FINDINGS: Endotracheal tube tip is 5.5 cm above the carina. Enteric tube terminates in the gastric fundus. Stable cardiomediastinal silhouette with top-normal heart size. No  pneumothorax. No pleural effusion. No pulmonary edema. No acute consolidative airspace disease. Stable thin linear opacities at the left greater than right lung bases. IMPRESSION: 1. Well-positioned endotracheal and enteric tubes. 2. Stable thin linear opacities at the left greater than right lung bases, either scarring or atelectasis. Electronically Signed   By: Ilona Sorrel M.D.   On: 02/16/2021 15:36   DG Chest Portable 1 View  Result Date: 02/16/2021 CLINICAL DATA:  intubation EXAM: PORTABLE CHEST 1 VIEW COMPARISON:  Same day chest radiograph. FINDINGS: Low lung volumes. Endotracheal tube tip projects near the carina. Gastric tube courses to the left at the level of the mainstem bronchus. The tip does project below the diaphragm. Similar streaky bibasilar opacities. No visible pleural effusions or pneumothorax. IMPRESSION: 1. Gastric tube courses to the left at the level of the mainstem bronchus. The tip does project below the diaphragm; however, location is indeterminate on this single AP radiograph and cannot exclude the tip being in lung. Recommend replacement and re-imaging or obtaining a lateral radiograph to further evaluate location. 2. Endotracheal tube tip projects near the carina. Retraction by approximately 3.5 cm would place the tip at the level of the clavicular heads. 3. Low lung volumes with similar streaky bibasilar opacities. Findings and recommendations discussed with Dr. Leory Plowman via telephone at 12:24 p.m. Electronically Signed   By: Margaretha Sheffield M.D.   On: 02/16/2021 12:26   DG Chest Port 1 View  Result Date: 02/16/2021 CLINICAL DATA:  SOB, acute, possible aspiration EXAM: PORTABLE CHEST 1 VIEW COMPARISON:  Same day chest radiograph. FINDINGS: Low lung volumes. No confluent consolidation. Mild streaky bibasilar opacities. No visible pleural effusions or pneumothorax. Cardiomediastinal silhouette is similar to prior. IMPRESSION: Low lung volumes with mild streaky bibasilar  opacities, possibly atelectasis, aspiration, and/or pneumonia. Electronically Signed   By: Margaretha Sheffield M.D.   On: 02/16/2021 12:05   DG Chest Portable 1 View  Result Date: 02/15/2021 CLINICAL DATA:  Altered mental status EXAM: PORTABLE CHEST 1 VIEW COMPARISON:  07/14/2019 FINDINGS: Elevated right hemidiaphragm, stable. Heart is borderline in size. No confluent opacities, effusions or edema. No acute bony abnormality. IMPRESSION: No active disease. Electronically Signed   By: Rolm Baptise M.D.   On: 02/15/2021 20:56   DG Abd Portable 1V  Result Date: 02/18/2021 CLINICAL DATA:  88 year old female feeding tube placement. EXAM: PORTABLE ABDOMEN - 1 VIEW COMPARISON:  None. FINDINGS: Portable AP semi upright view at 1015 hours. Enteric feeding tube courses to the left upper quadrant, and then across midline to the right with tip projecting over the inferior liver shadow. Paucity of bowel gas in the upper abdomen. Several right abdominal surgical clips. Mildly elevated right hemidiaphragm, otherwise negative visible lung bases. Disc and endplate degeneration in the visible spine. IMPRESSION: Enteric feeding tube  terminates in the right upper quadrant, compatible with tip at the proximal duodenum. Electronically Signed   By: Genevie Ann M.D.   On: 02/18/2021 10:32   EEG adult  Result Date: 02/18/2021 Greta Doom, MD     02/18/2021  2:16 PM History: 88 yo F with worsening aphasia. Sedation: none Technique: This EEG was acquired with electrodes placed according to the International 10-20 electrode system (including Fp1, Fp2, F3, F4, C3, C4, P3, P4, O1, O2, T3, T4, T5, T6, A1, A2, Fz, Cz, Pz). The following electrodes were missing or displaced: none. Background: The background consists of intermixed alpha and beta activities. There is a well defined posterior dominant rhythm of 8-9 Hz that is seen bilaterally. In addition, there is focal irregular delta activity seen in the left hemisphere, maximal in  the left temporal region. Photic stimulation: Physiologic driving is not performed EEG Abnormalities: 1) left hemispheric lsow activity with wide field, maximal in left temporal region. Clinical Interpretation: This is consistent with a focal non-specific cerebral disturbance of the left hemisphere. There was no seizure or seizure predisposition recorded on this study. Please note that lack of epileptiform activity on EEG does not preclude the possibility of epilepsy. Roland Rack, MD [facility] [phone] If 7pm- 7am, please page neurology on call as listed in [place].   ECHOCARDIOGRAM COMPLETE  Result Date: 02/17/2021    ECHOCARDIOGRAM REPORT   Patient Winters:   MINERVIA OSSO Date of Exam: 02/17/2021 Medical Rec #:  [id number]        Height:       72.0 in Accession #:    [id number]       Weight:       235.4 lb Date of Birth:  1932-08-20        BSA:          2.284 m Patient Age:    88 years         BP:           123/57 mmHg Patient Gender: F                HR:           62 bpm. Exam Location:  Inpatient Procedure: 2D Echo, Cardiac Doppler and Color Doppler Indications:    I63.9 STROKE  History:        Patient has prior history of Echocardiogram examinations, most                 recent 07/15/2019. Stroke; Risk Factors:Hypertension, Diabetes                 and Family History of Coronary Artery Disease. CA / CAD.  Sonographer:    Beryle Beams Referring Phys: [id number] [place]  Sonographer Comments: No subcostal window, patient is morbidly obese and echo performed with patient supine and on artificial respirator. IMPRESSIONS  1. Left ventricular ejection fraction, by estimation, is 60 to 65%. The left ventricle has normal function. Left ventricular endocardial border not optimally defined to evaluate regional wall motion. Left ventricular diastolic function could not be evaluated.  2. Right ventricular systolic function is normal. The right ventricular size is normal. Tricuspid  regurgitation signal is inadequate for assessing PA pressure.  3. The mitral valve is grossly normal. No evidence of mitral valve regurgitation. No evidence of mitral stenosis.  4. The aortic valve is grossly normal. Aortic valve regurgitation is not visualized. No aortic stenosis is present. Conclusion(s)/Recommendation(s): No intracardiac source of embolism  detected on this transthoracic study. Consider a transesophageal echocardiogram to exclude cardiac source of embolism if clinically indicated. FINDINGS  Left Ventricle: Left ventricular ejection fraction, by estimation, is 60 to 65%. The left ventricle has normal function. Left ventricular endocardial border not optimally defined to evaluate regional wall motion. The left ventricular internal cavity size was normal in size. There is no left ventricular hypertrophy. Left ventricular diastolic function could not be evaluated due to nondiagnostic images. Left ventricular diastolic function could not be evaluated. Right Ventricle: No subviews. The right ventricular size is normal. No increase in right ventricular wall thickness. Right ventricular systolic function is normal. Tricuspid regurgitation signal is inadequate for assessing PA pressure. Left Atrium: Left atrial size was normal in size. Right Atrium: Right atrial size was normal in size. Pericardium: Trivial pericardial effusion is present. Mitral Valve: The mitral valve is grossly normal. No evidence of mitral valve regurgitation. No evidence of mitral valve stenosis. Tricuspid Valve: The tricuspid valve is grossly normal. Tricuspid valve regurgitation is not demonstrated. No evidence of tricuspid stenosis. Aortic Valve: The aortic valve is grossly normal. Aortic valve regurgitation is not visualized. No aortic stenosis is present. Aortic valve mean gradient measures 3.0 mmHg. Aortic valve peak gradient measures 6.2 mmHg. Aortic valve area, by VTI measures 2.44 cm. Pulmonic Valve: The pulmonic valve was  grossly normal. Pulmonic valve regurgitation is not visualized. No evidence of pulmonic stenosis. Aorta: The aortic root and ascending aorta are structurally normal, with no evidence of dilitation. Venous: The inferior vena cava was not well visualized. IAS/Shunts: The atrial septum is grossly normal.  LEFT VENTRICLE PLAX 2D LVIDd:         4.00 cm     Diastology LVIDs:         2.50 cm     LV e' medial:  4.97 cm/s LV PW:         1.10 cm     LV e' lateral: 7.96 cm/s LV IVS:        0.80 cm LVOT diam:     2.30 cm LV SV:         50 LV SV Index:   22 LVOT Area:     4.15 cm  LV Volumes (MOD) LV vol d, MOD A2C: 57.5 ml LV vol d, MOD A4C: 74.1 ml LV vol s, MOD A2C: 30.4 ml LV vol s, MOD A4C: 26.4 ml LV SV MOD A2C:     27.1 ml LV SV MOD A4C:     74.1 ml LV SV MOD BP:      37.2 ml RIGHT VENTRICLE RV S prime:     18.40 cm/s TAPSE (M-mode): 1.2 cm LEFT ATRIUM             Index        RIGHT ATRIUM           Index LA diam:        3.70 cm 1.62 cm/m   RA Area:     12.90 cm LA Vol (A2C):   56.7 ml 24.83 ml/m  RA Volume:   29.60 ml  12.96 ml/m LA Vol (A4C):   46.4 ml 20.32 ml/m LA Biplane Vol: 53.6 ml 23.47 ml/m  AORTIC VALVE                    PULMONIC VALVE AV Area (Vmax):    2.61 cm     PV Vmax:       0.83 m/s AV Area (Vmean):  2.48 cm     PV Vmean:      48.600 cm/s AV Area (VTI):     2.44 cm     PV VTI:        0.116 m AV Vmax:           125.00 cm/s  PV Peak grad:  2.8 mmHg AV Vmean:          85.000 cm/s  PV Mean grad:  1.0 mmHg AV VTI:            0.204 m AV Peak Grad:      6.2 mmHg AV Mean Grad:      3.0 mmHg LVOT Vmax:         78.60 cm/s LVOT Vmean:        50.800 cm/s LVOT VTI:          0.120 m LVOT/AV VTI ratio: 0.59  AORTA Ao Root diam: 2.90 cm Ao Asc diam:  2.70 cm  SHUNTS Systemic VTI:  0.12 m Systemic Diam: 2.30 cm Eleonore Chiquito MD Electronically signed by Eleonore Chiquito MD Signature Date/Time: 02/17/2021/2:25:21 PM    Final    IR PERCUTANEOUS ART THROMBECTOMY/INFUSION INTRACRANIAL INC DIAG ANGIO  Result Date:  02/16/2021 INDICATION: 88 year old female with past medical history significant for coronary artery disease, AFib on Coumadin , diabetes mellitus, hypertension and stroke. She presented to outside hospital with hypoglycemia on 02/15/2021. At approximately 10:35 a.m. on 02/16/2021 she developed right-sided weakness and became unresponsive. NIH SS 24; baseline modified Rankin scale. Head CT was performed showing no evidence of acute territorial infarct or hemorrhage (ASPECTS 10). CT angiogram of the head and neck showed an occlusion of the distal left M1/MCA. She was transferred to our service for a diagnostic cerebral angiogram and mechanical thrombectomy. EXAM: ULTRASOUND-GUIDED VASCULAR ACCESS DIAGNOSTIC CEREBRAL ANGIOGRAM MECHANICAL THROMBECTOMY FLAT PANEL HEAD CT COMPARISON:  CT angiogram of the head and neck February 16, 2021 MEDICATIONS: Refer to anesthesia documentation. ANESTHESIA/SEDATION: The procedure was performed under general anesthesia. CONTRAST:  64 mL of Omnipaque 300 milligram/mL FLUOROSCOPY: Radiation Exposure Index (as provided by the fluoroscopic device): 578.4 mGy Kerma COMPLICATIONS: None immediate. TECHNIQUE: Informed written consent was weight due to emergency nature of intervention. Maximal Sterile Barrier Technique was utilized including caps, mask, sterile gowns, sterile gloves, sterile drape, hand hygiene and skin antiseptic. A timeout was performed prior to the initiation of the procedure. The right groin was prepped and draped in the usual sterile fashion. Using a micropuncture kit and the modified Seldinger technique, access was gained to the right common femoral artery and an 8 French sheath was placed. Real-time ultrasound guidance was utilized for vascular access including the acquisition of a permanent ultrasound image documenting patency of the accessed vessel. Under fluoroscopy, a Zoom 88 guide catheter was navigated over a 6 [place] Berenstein 2 catheter and a 0.035" Terumo  Glidewire into the aortic arch. Given type 3 arch, decision was made to exchange the Dennison 2 catheter for a 6 French Simmons 2 catheter. The tip was reformed in the aortic arch. The catheter was placed into the left common carotid artery. Frontal and lateral angiograms of the neck were obtained. The diagnostic catheter was removed. The guide catheter was advanced over the wire into the left internal carotid artery. Frontal and lateral angiograms of the head were obtained. FINDINGS: 1. A right common femoral artery is patent and has normal caliber, adequate for vascular access. 2. Atherosclerotic changes are seen in the left carotid bifurcation without hemodynamically  significant stenosis. 3. Occlusion of the distal left M1/MCA just distal to the takeoff of the anterior temporal artery. PROCEDURE: Using biplane roadmap, a zoom 71 aspiration catheter was navigated over an Aristotle 24 microguidewire into the cavernous segment of the left ICA. Attempts to navigate the catheter into the distal ICA proved unsuccessful. Under biplane roadmap, a zoom 71 aspiration catheter was navigated over a phenom 21 microcatheter and a synchro support microguidewire into the supraclinoid left ICA. The microcatheter was then navigated over the wire into the distal left M2/MCA inferior division branch. Then, a 4 x 40 mm solitaire stent retriever was deployed spanning the distal M1 and proximal M2 segment. The device was allowed to intercalated with the clot for 4 minutes. The microcatheter was removed. The aspiration catheter was advanced to the level of occlusion and connected to a penumbra aspiration pump. The thrombectomy device and aspiration catheter were removed under constant aspiration. The guiding catheter was aspirated debris. Left internal carotid artery angiograms with magnified frontal and lateral frontal lateral views of the head followed by left anterior oblique and lateral views of the entire head showed complete  recanalization of the left MCA vascular tree. No evidence of thromboembolic complication. Flat panel CT of the head was obtained and post processed in a separate workstation with concurrent attending physician supervision. Selected images were sent to PACS. No evidence of hemorrhagic complication. The guiding catheter was then retracted into the right common carotid artery under continues contrast injection. No evidence of iatrogenic injury. Right common femoral artery angiogram was obtained in right anterior oblique view. The puncture is at the level of the common femoral artery. The artery has normal caliber, adequate for closure device. The sheath was exchanged over the wire for a Perclose prostyle which was utilized for access closure. Immediate hemostasis was achieved. IMPRESSION: 1. Successful mechanical thrombectomy performed for treatment of a distal left M1/MCA occlusion with complete recanalization after 1 pass (TICI 3). 2. No evidence of hemorrhagic or thromboembolic complication. PLAN: 1. Transferred to ICU for continued care. 2. SBP 120-140 mmHg x 24 hours. Electronically Signed   By: Pedro Earls M.D.   On: 02/16/2021 14:48   CT HEAD CODE STROKE WO CONTRAST  Result Date: 02/16/2021 CLINICAL DATA:  Code stroke. Altered mental status running 10 minutes ago EXAM: CT HEAD WITHOUT CONTRAST TECHNIQUE: Contiguous axial images were obtained from the base of the skull through the vertex without intravenous contrast. RADIATION DOSE REDUCTION: This exam was performed according to the departmental dose-optimization program which includes automated exposure control, adjustment of the mA and/or kV according to patient size and/or use of iterative reconstruction technique. COMPARISON:  Head CT 07/14/2019 FINDINGS: Brain: No evidence of acute infarction, hemorrhage, hydrocephalus, extra-axial collection or mass lesion/mass effect. Frontal and temporal lobe atrophy. Small left cerebellar infarction  since prior but the finding chronic appearing. Mild chronic small vessel ischemia in the hemispheric white matter Vascular: No convincing hyperdense vessel. Prominent density of the left MCA on sagittal images is at the level of streak artifact and not seen on the other views. Skull: Normal. Negative for fracture or focal lesion. Sinuses/Orbits: No acute finding. Other: These results were communicated to Dr. Theda Sers at 10:53 am on 02/16/2021 by text page via the [facility] messaging system. ASPECTS [facility] Stroke Program Early CT Score) Not scored without localizing symptom IMPRESSION: 1. No acute finding. 2. Small left cerebellar infarct since 2021, but chronic appearing. 3. Frontal and temporal predominant atrophy. Electronically Signed   By:  Jorje Guild M.D.   On: 02/16/2021 10:51   CT ANGIO HEAD NECK W WO CM (CODE STROKE)  Result Date: 02/16/2021 CLINICAL DATA:  Acute onset altered mental status EXAM: CT ANGIOGRAPHY HEAD AND NECK TECHNIQUE: Multidetector CT imaging of the head and neck was performed using the standard protocol during bolus administration of intravenous contrast. Multiplanar CT image reconstructions and MIPs were obtained to evaluate the vascular anatomy. Carotid stenosis measurements (when applicable) are obtained utilizing NASCET criteria, using the distal internal carotid diameter as the denominator. RADIATION DOSE REDUCTION: This exam was performed according to the departmental dose-optimization program which includes automated exposure control, adjustment of the mA and/or kV according to patient size and/or use of iterative reconstruction technique. CONTRAST:  Dose is not known on this in progress study COMPARISON:  Preceding noncontrast head CT FINDINGS: CTA NECK FINDINGS Aortic arch: Atheromatous calcification.  Three vessel arch. Right carotid system: Calcified plaque at the bifurcation and proximal ICA without stenosis or ulceration Left carotid system: Calcified plaque at the  bifurcation with up to 40% narrowing. No ulceration or beading. Vertebral arteries: No proximal subclavian stenosis. Left dominant vertebral artery. Scattered atheromatous calcifications in the left vertebral artery, non flow reducing. No beading. Skeleton: No acute finding Other neck: No acute finding Upper chest: No acute finding Review of the MIP images confirms the above findings CTA HEAD FINDINGS Anterior circulation: Calcified plaque on the carotid siphons without flow limiting stenosis. Occlusion with abrupt cut off at the left distal M1 segment just beyond the anterior temporal branch. There is some faint downstream flow. No contralateral or anterior cerebral large vessel occlusion is seen. Posterior circulation: High-grade narrowing of the left vertebral artery at the level of the dura and near the left PICA origin with faint enhancement. Most of right vertebral artery flows to the right PICA with faint flow seen at the basilar where there is likely a stenosis. The basilar is small in the setting of bilateral fetal type PCA. No PCA occlusion or proximal stenosis noted. Venous sinuses: Negative Anatomic variants: As above Review of the MIP images confirms the above findings Critical Value/emergent results were called by telephone at the time of interpretation on 02/16/2021 at 11:05 am to provider [facility] , who verbally acknowledged these results. IMPRESSION: 1. Positive for emergent large vessel occlusion at the distal left M1 segment just beyond the anterior temporal branch. 2. Calcified plaque causes 40% narrowing at the left ICA origin. 3. Advanced narrowing of the non dominant left V4 segment. Small basilar in the setting of fetal type bilateral PCA flow. Electronically Signed   By: Jorje Guild M.D.   On: 02/16/2021 11:09      HISTORY OF PRESENT ILLNESS Patient with a history of dementia, atrial fibrillation, stroke, colon cancer and T2DM presented with acute onset right arm and leg weakness  and left gaze deviation.   HOSPITAL COURSE Upon arrival, patient was given TNK and thrombectomy was performed.  While thrombectomy was successful, patient had significant residual deficits and was unable to swallow.  In accordance with patient's previously stated wishes, patient's family elected not to insert a PEG tube and to choose comfort care.  Patient will be discharged to an inpatient hospice.  Stroke:  left MCA infarct  likely secondary due to left middle cerebral artery occlusion etiology likely A-fib not on anticoagulation  code Stroke CT head No acute abnormality. Chronic left cerebellar infarct CTA head & neck LVO in distal left M1 segment, 40% narrowing of left  ICA Post IR CT No acute abnormality, old cerebellar infarct MRI  left MCA territory infarct involving left frontoparietal cortex and insula, additional small infarcts in left occipital lobe and left cerebellum 2D Echo EF 60-65%, normal atrial septum, no intracardiac source of embolism LDL 41 HgbA1c 6.2 EEG focal left hemispheric slowing.  No seizure activity Disposition:  family requested comfort care given her poor prognosis and low quality of life -> residential hospice   Hypertension Home meds:  lisinopril 10 mg daily Stable   Hyperlipidemia Home meds:  rosuvastatin 10 mg daily LDL 41   Diabetes type II Controlled Home meds:  insulin 70/30 unknown dose with meals HgbA1c 6.2, goal < 7.0 CBGs Was on SSI and 5u levimir daily   Other Stroke Risk Factors Advanced Age >/= 12  Obesity, Body mass index is 32.44 kg/m., BMI >/= 30 associated with increased stroke risk, recommend weight loss, diet and exercise as appropriate  Hx stroke    RN Pressure Injury Documentation: Pressure Injury 02/16/21 Coccyx Medial Stage 3 -  Full thickness tissue loss. Subcutaneous fat may be visible but bone, tendon or muscle are NOT exposed. red, open area with blanchable erythema present. Open area approximately 2cm x .5cm (Active)   02/16/21 1430  Location: Coccyx  Location Orientation: Medial  Staging: Stage 3 -  Full thickness tissue loss. Subcutaneous fat may be visible but bone, tendon or muscle are NOT exposed.  Wound Description (Comments): red, open area with blanchable erythema present. Open area approximately 2cm x .5cm  Present on Admission: Yes     DISCHARGE EXAM Blood pressure (!) 176/70, pulse (!) 54, temperature 98.8 F (37.1 C), temperature source Oral, resp. rate 18, height 6' (1.829 m), weight 108.5 kg, SpO2 100 %.  General:  Obese Caucasian female in no acute distress   Neurological:  Patient's eyes are closed and she resists opening them.  PERRL.  She will move LUE and LLE nonpurposefully.  Discharge Diet       Diet   Diet NPO time specified   liquids  DISCHARGE PLAN Disposition:  to inpatient hospice No antithrombotic  Follow-up with inpatient hospice physician   20 minutes were spent preparing discharge.  [place] , MSN, AGACNP-BC [facility] See Amion for schedule and pager information 02/21/2021 4:07 PM   ATTENDING NOTE: I reviewed above note and agree with the assessment and plan. Pt was seen and examined.   Per family request, patient currently in comfort care measures and patient seems comfortable at this time.  We will transition to residential hospice once bed available today.  For detailed assessment and plan, please refer to above as I have made changes wherever appropriate.   [name], MD PhD Stroke Neurology 02/21/2021 4:51 PM

## 2021-02-21 NOTE — Progress Notes (Signed)
STROKE TEAM PROGRESS NOTE   INTERVAL HISTORY Patient is seen in her room with no family at the bedside.  She has been transitioned to comfort care and appears to be resting comfortably at this time.   Awaiting determination of eligibility for inpatient hospice. Vitals:   02/20/21 0803 02/20/21 1111 02/20/21 2020 02/21/21 0406  BP: (!) 160/70 127/68 132/83 (!) 176/70  Pulse: 62 (!) 46 69 (!) 54  Resp: 20  16 18   Temp: 97.6 F (36.4 C) 97.6 F (36.4 C) 97.7 F (36.5 C) 98.8 F (37.1 C)  TempSrc: Oral Oral Oral Oral  SpO2: 100% 99% 99% 100%  Weight:      Height:       CBC:  Recent Labs  Lab 02/16/21 1100 02/16/21 1523 02/18/21 0720 02/20/21 0917  WBC 6.9  --  9.3 8.5  NEUTROABS 4.6  --   --   --   HGB 12.9   < > 12.2 11.0*  HCT 41.1   < > 37.5 34.7*  MCV 86.2  --  83.9 85.9  PLT 270  --  250 270   < > = values in this interval not displayed.    Basic Metabolic Panel:  Recent Labs  Lab 02/19/21 0426 02/19/21 1626 02/20/21 0917  NA 138  --  139  K 4.0  --  3.7  CL 103  --  108  CO2 23  --  26  GLUCOSE 278*  --  256*  BUN 24*  --  40*  CREATININE 0.95  --  0.71  CALCIUM 7.9*  --  7.8*  MG 2.2 2.1 2.1  PHOS 2.9 2.2* 2.4*    Lipid Panel:  Recent Labs  Lab 02/17/21 0529  CHOL 84  TRIG 97  HDL 24*  CHOLHDL 3.5  VLDL 19  LDLCALC 41    HgbA1c:  Recent Labs  Lab 02/16/21 1520  HGBA1C 6.2*    Urine Drug Screen:  Recent Labs  Lab 02/16/21 1151  LABOPIA NONE DETECTED  COCAINSCRNUR NONE DETECTED  LABBENZ NONE DETECTED  AMPHETMU NONE DETECTED  THCU NONE DETECTED  LABBARB NONE DETECTED     Alcohol Level No results for input(s): ETH in the last 168 hours.  IMAGING past 24 hours No results found.  PHYSICAL EXAM General:  Obese Caucasian female in no acute distress  Neurological:  Patient's eyes are closed and she resists opening them.  PERRL.  She will move LUE and LLE nonpurposefully.  ASSESSMENT/PLAN [name] is a 88 y.o. female  with history of dementia, atrial fibrillation, prior stroke, colon cancer and T2DM presenting with right arm and leg weakness and left gaze deviation.  2/13- Patient was given TNK and transferred here for thrombectomy.  Thrombectomy was successful with TICI3 recanalization on the left M1 MCA.  Patient remained intubated after the procedure.  Upon discussion with patient's sister, code status was changed to DNR and decision was made not to reintubate after extubation. Extubated 2/15.   Family has opted for comfort care, and patient is awaiting determination of eligibility for inpatient hospice.  Stroke:  left MCA infarct  likely secondary due to left middle cerebral artery occlusion etiology likely A-fib not on anticoagulation  code Stroke CT head No acute abnormality. Chronic left cerebellar infarct CTA head & neck LVO in distal left M1 segment, 40% narrowing of left ICA Post IR CT No acute abnormality, old cerebellar infarct MRI  left MCA territory infarct involving left frontoparietal cortex and insula,  additional small infarcts in left occipital lobe and left cerebellum 2D Echo EF 60-65%, normal atrial septum, no intracardiac source of embolism LDL 41 HgbA1c 6.2 EEG focal left hemispheric slowing.  No seizure activity VTE prophylaxis - lovenox aspirin 81 mg daily prior to admission, now on aspirin 81 mg daily.  Therapy recommendations :SNF  Disposition:  comfort care  Hypertension Home meds:  lisinopril 10 mg daily Stable Keep SBP 120-140 Cleviprex as needed Long-term BP goal normotensive  Hyperlipidemia Home meds:  rosuvastatin 10 mg daily, resumed in hospital LDL 41, goal < 70 Continue statin at discharge  Diabetes type II Controlled Home meds:  insulin 70/30 unknown dose with meals HgbA1c 6.2, goal < 7.0 CBGs SSI and 5u levimir daily  Other Stroke Risk Factors Advanced Age >/= 107  Obesity, Body mass index is 32.44 kg/m., BMI >/= 30 associated with increased stroke risk,  recommend weight loss, diet and exercise as appropriate  Hx stroke  Other Active Problems Acute respiratory failure Patient was intubated for thrombectomy Extubated 2/15- now DNR/DNI Management per [facility] day # 5  Patient seen and examined by NP/APP with MD. MD to update note as needed.   [place] , MSN, AGACNP-BC [facility] See Amion for schedule and pager information 02/21/2021 1:10 PM     To contact Stroke Continuity provider, please refer to [email or web address] After hours, contact General Neurology

## 2021-02-21 NOTE — Discharge Instructions (Signed)
Alisha Winters was admitted with a left MCA stroke and treated with thrombectomy.  She had significant deficits due to her stroke and was unable to swallow.  In accordance with her wishes, her family decided not to insert a PEG tube and to pursue comfort care.  She will be discharged to an inpatient hospice facility.

## 2021-02-21 NOTE — TOC Transition Note (Signed)
Transition of Care [facility]) - CM/SW Discharge Note   Patient Details  Name: Alisha Winters MRN: [id number] Date of Birth: 17-Aug-1932  Transition [facility] Care [facility] CM/SW Contact:  Geralynn Ochs, LCSW Phone Number: 02/21/2021, 3:33 PM   Clinical Narrative:   CSW notified by [facility] liaison that patient has been approved for hospice and bed is available for patient today. Transport has been arranged with [facility] for next available. No other TOC needs at this time.  Nurse to call report to [phone].     Final next level of care: [place] Barriers to Discharge: Barriers Resolved   Patient Goals and CMS Choice        Discharge Placement                Patient to be transferred to facility by: [place] Name of family member notified: [place] Patient and family notified of of transfer: 02/21/21  Discharge Plan and Services                                     Social Determinants of Health (SDOH) Interventions     Readmission Risk Interventions No flowsheet data found.

## 2021-02-21 NOTE — Progress Notes (Addendum)
Judi Cong to be D/C'd to [facility] per MD order. Report called to [place], Therapist, sports. IV catheter accidentally pulled out. Attempted to call facility to notify but no answer. [facility] made aware.  Melonie [place]  02/21/2021

## 2021-03-04 DEATH — deceased

## 2021-04-07 IMAGING — CT CT HIP*R* W/O CM
2 of 3 series · 17 of 46 positions shown, 19 images · non-contrast
Comparison: None.

CLINICAL DATA: Right hip pain after fall

EXAM:
CT OF THE RIGHT HIP WITHOUT CONTRAST
TECHNIQUE: Multidetector CT imaging of the right hip was performed according to
the standard protocol. Multiplanar CT image reconstructions were
also generated.

[Series 3: axial st · axial · 0.47mm/px · z∈[-1297,-1131]mm · 14 of 97 slices shown, 16 images]
[im 7/97  soft-tissue]
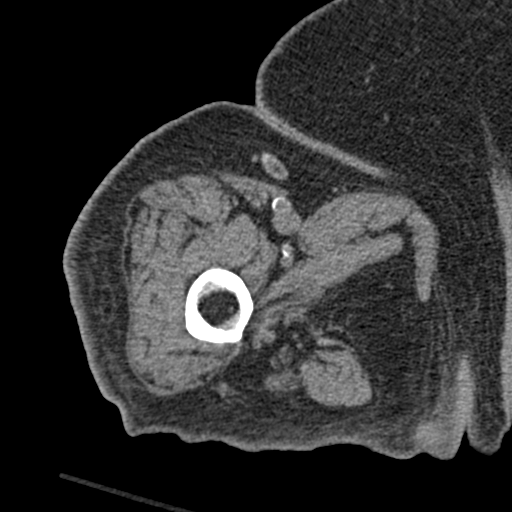
[im 7/97  bone]
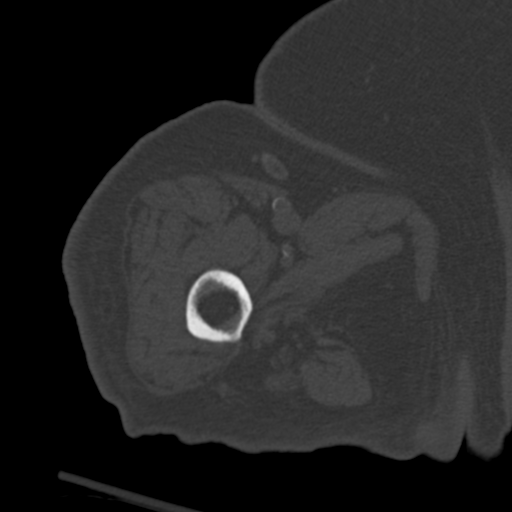
[im 13/97  soft-tissue]
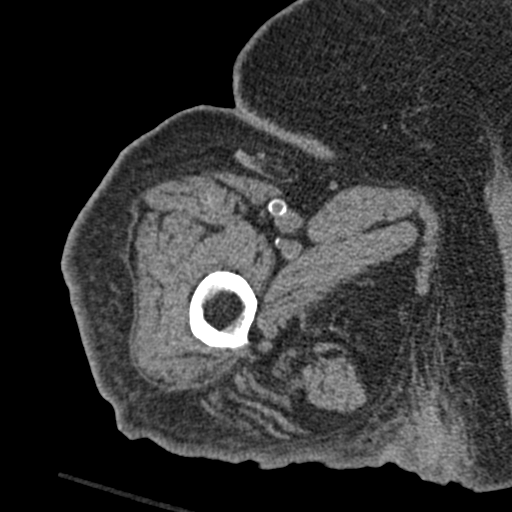
[im 19/97  soft-tissue]
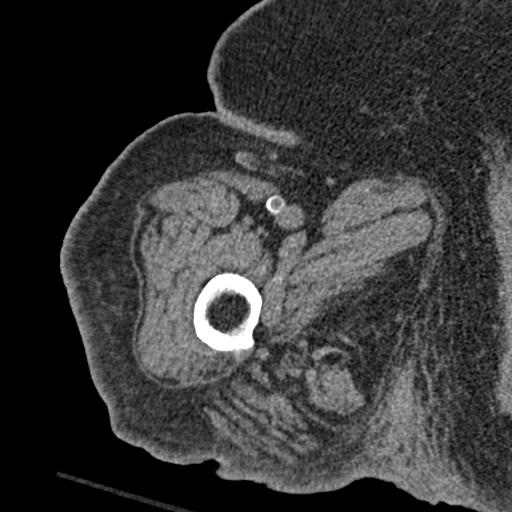
[im 25/97  soft-tissue]
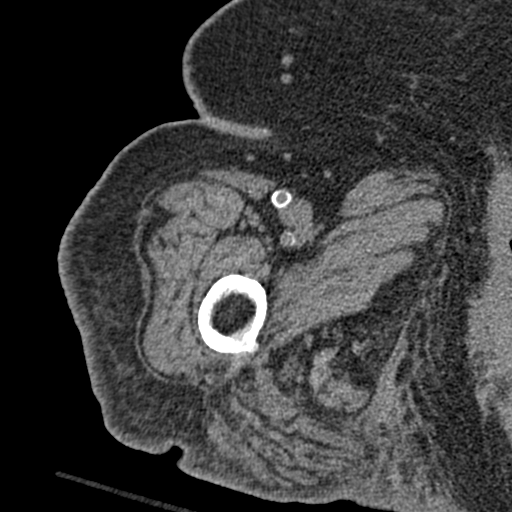
[im 31/97  soft-tissue]
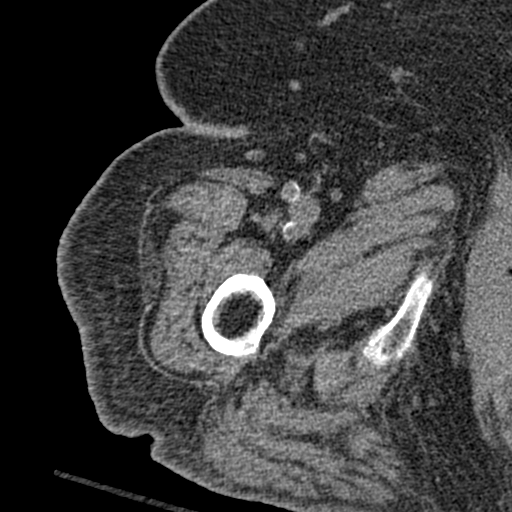
[im 38/97  soft-tissue]
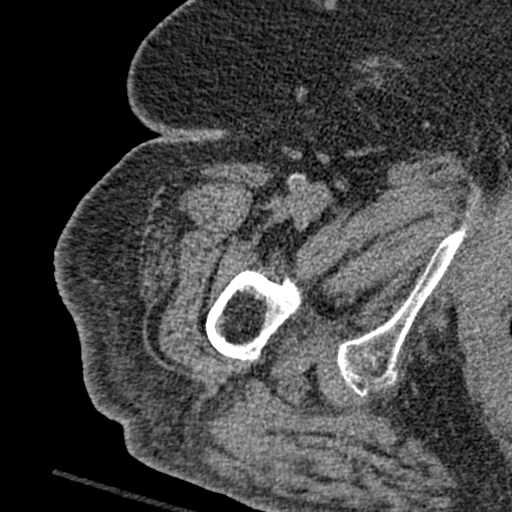
[im 44/97  soft-tissue]
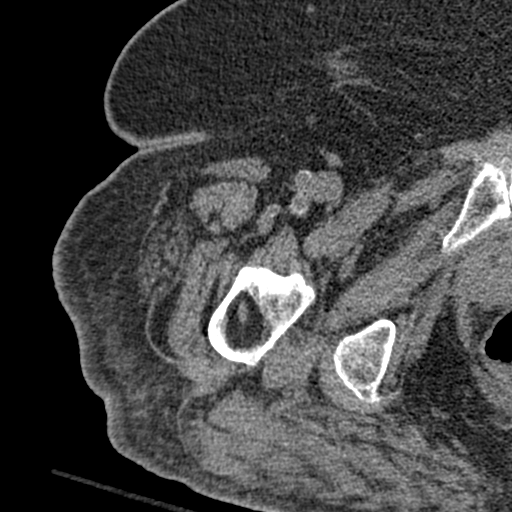
[im 53/97  soft-tissue]
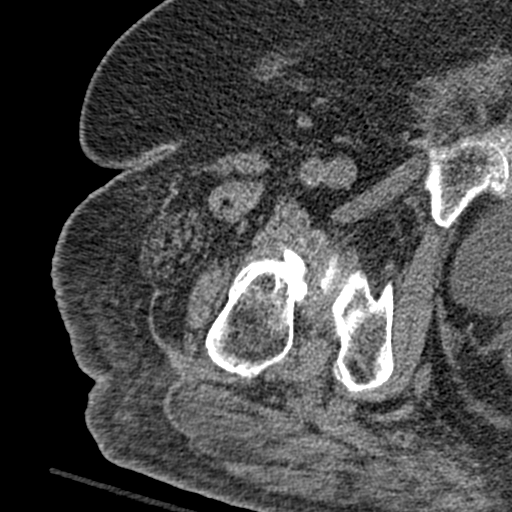
[im 59/97  soft-tissue]
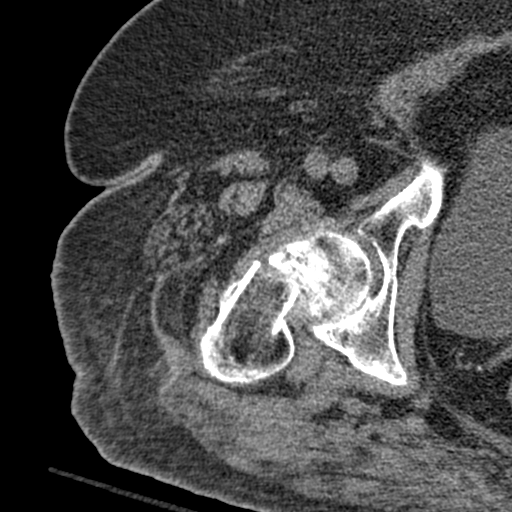
[im 59/97  bone]
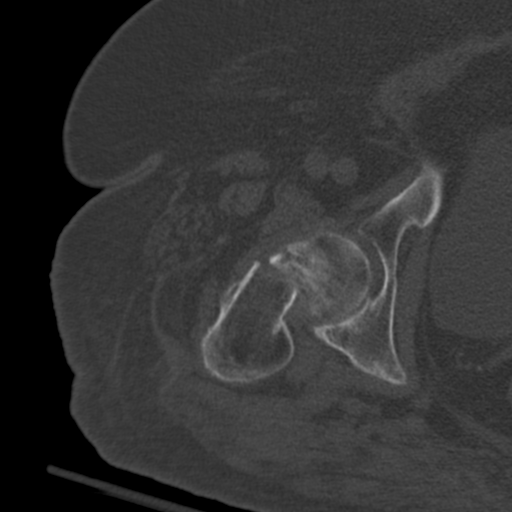
[im 66/97  soft-tissue]
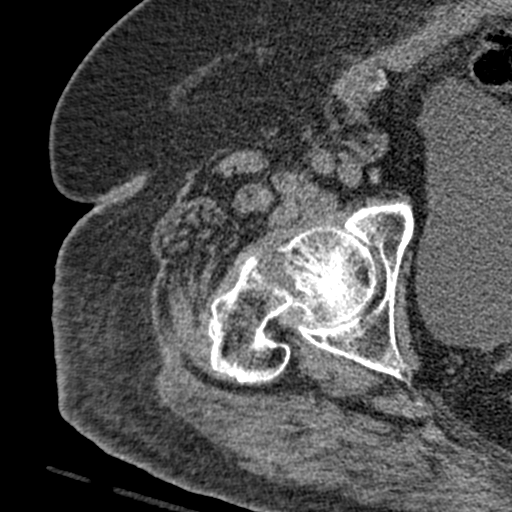
[im 72/97  soft-tissue]
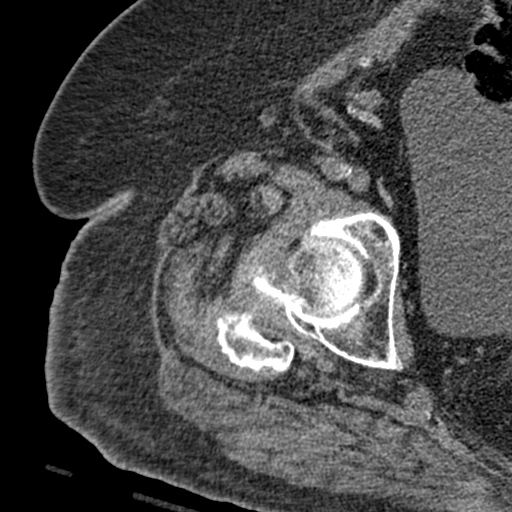
[im 78/97  soft-tissue]
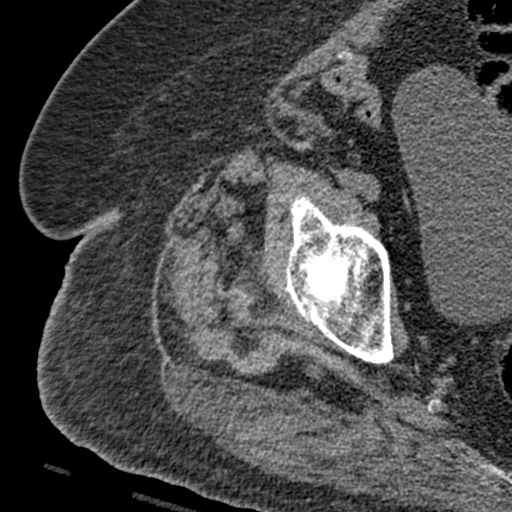
[im 84/97  soft-tissue]
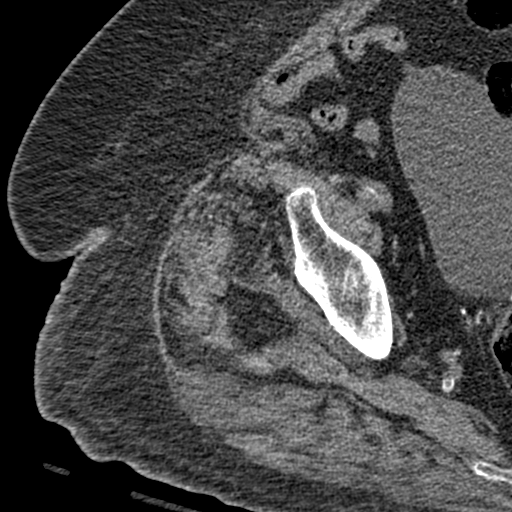
[im 90/97  soft-tissue]
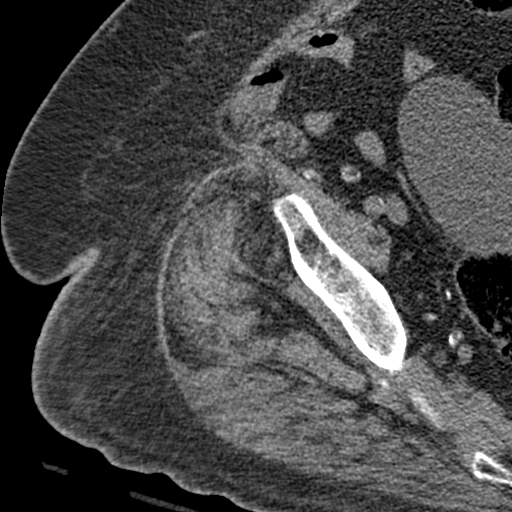

[Series 8: coronal st · coronal · 0.38mm/px · 3 of 146 slices shown]
[im 49/146  soft-tissue]
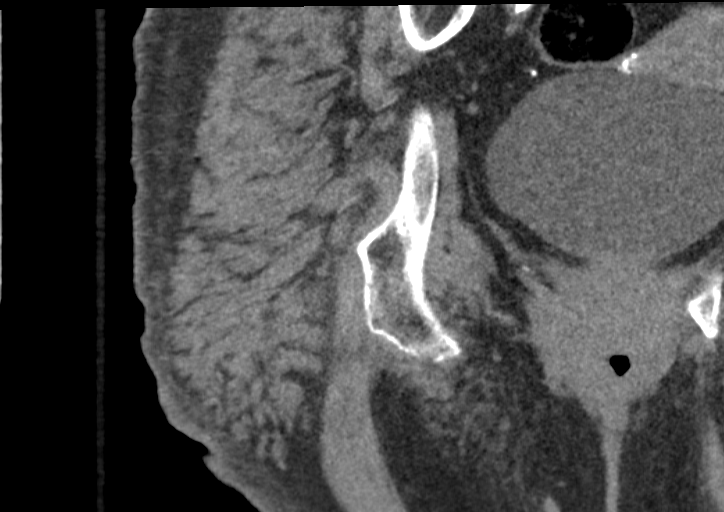
[im 65/146  soft-tissue]
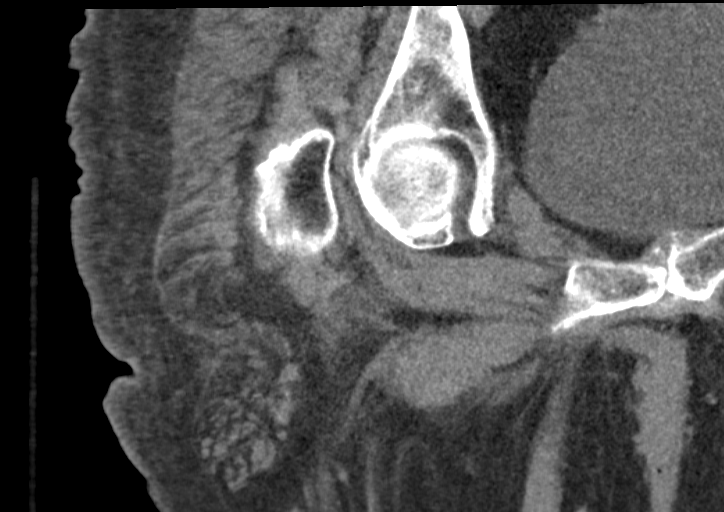
[im 81/146  soft-tissue]
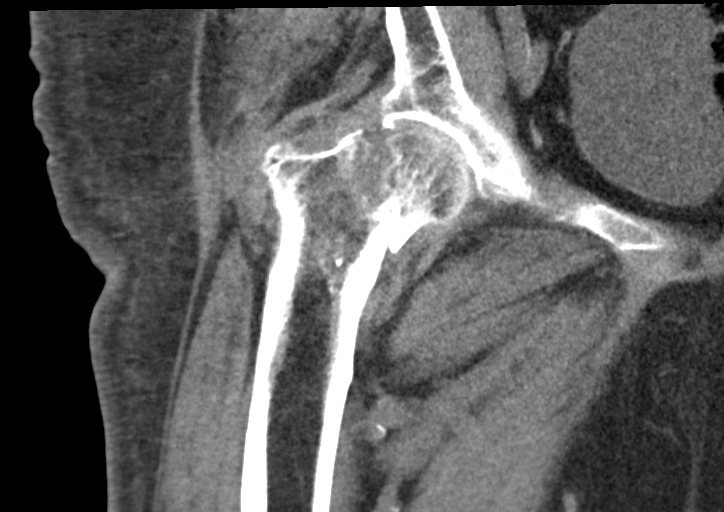

[17 of 46 positions shown; findings below may reference images not displayed]

FINDINGS: Bones/Joint/Cartilage

There is a transverse fracture of the right femoral neck with mild
posterior angulation. There is foreshortening of the neck. No
acetabular fracture. No dislocation of the femoral head.

Ligaments

Suboptimally assessed by CT.

Muscles and Tendons

No intramuscular hematoma.

Soft tissues

Unremarkable
IMPRESSION: Transverse fracture of the right femoral neck with mild posterior
angulation and foreshortening of the neck.
